# Patient Record
Sex: Female | Born: 1990 | Race: White | Hispanic: No | Marital: Single | Smoking: Former smoker
Health system: Southern US, Community
[De-identification: ages and names within clinical notes are randomized; demographics above are authoritative.]

## PROBLEM LIST (undated history)

## (undated) VITALS — BP 116/78 | HR 102 | Temp 97.8°F | Resp 16 | Ht 63.5 in | Wt 125.0 lb

## (undated) DIAGNOSIS — N390 Urinary tract infection, site not specified: Secondary | ICD-10-CM

## (undated) DIAGNOSIS — F1990 Other psychoactive substance use, unspecified, uncomplicated: Secondary | ICD-10-CM

## (undated) DIAGNOSIS — F32A Depression, unspecified: Secondary | ICD-10-CM

## (undated) DIAGNOSIS — B1920 Unspecified viral hepatitis C without hepatic coma: Secondary | ICD-10-CM

## (undated) DIAGNOSIS — F329 Major depressive disorder, single episode, unspecified: Secondary | ICD-10-CM

## (undated) HISTORY — PX: NO PAST SURGERIES: SHX2092

## (undated) HISTORY — DX: Unspecified viral hepatitis C without hepatic coma: B19.20

## (undated) HISTORY — PX: INDUCED ABORTION: SHX677

---
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
## 2011-05-31 ENCOUNTER — Emergency Department (HOSPITAL_COMMUNITY)
Admission: EM | Admit: 2011-05-31 | Discharge: 2011-05-31 | Disposition: A | Discharge status: Home / Self Care | Payer: Self-pay | Attending: Emergency Medicine | Admitting: Emergency Medicine

## 2011-05-31 DIAGNOSIS — N898 Other specified noninflammatory disorders of vagina: Secondary | ICD-10-CM | POA: Insufficient documentation

## 2011-05-31 DIAGNOSIS — R300 Dysuria: Secondary | ICD-10-CM | POA: Insufficient documentation

## 2011-05-31 DIAGNOSIS — N390 Urinary tract infection, site not specified: Secondary | ICD-10-CM | POA: Insufficient documentation

## 2011-05-31 DIAGNOSIS — R319 Hematuria, unspecified: Secondary | ICD-10-CM | POA: Insufficient documentation

## 2011-05-31 HISTORY — DX: Urinary tract infection, site not specified: N39.0

## 2011-05-31 LAB — WET PREP, GENITAL
Trich, Wet Prep: NONE SEEN
Yeast Wet Prep HPF POC: NONE SEEN

## 2011-05-31 LAB — URINALYSIS, ROUTINE W REFLEX MICROSCOPIC
Bilirubin Urine: NEGATIVE
Ketones, ur: NEGATIVE mg/dL
Nitrite: NEGATIVE
Specific Gravity, Urine: 1.018 (ref 1.005–1.030)
Urobilinogen, UA: 0.2 mg/dL (ref 0.0–1.0)

## 2011-05-31 LAB — POCT PREGNANCY, URINE: Preg Test, Ur: NEGATIVE

## 2011-05-31 LAB — URINE MICROSCOPIC-ADD ON

## 2011-05-31 MED ORDER — NITROFURANTOIN MONOHYD MACRO 100 MG PO CAPS: 100.0000 mg | ORAL_CAPSULE | Freq: Two times a day (BID) | ORAL | Status: AC

## 2011-05-31 NOTE — ED Provider Notes (Signed)
History     CSN: [id number] Arrival date & time: 05/31/2011  5:20 PM   First MD Initiated Contact with Patient 05/31/11 1829      Chief Complaint  Patient presents with  . Urinary Frequency  . Dysuria  . Hematuria    (Consider location/radiation/quality/duration/timing/severity/associated sxs/prior treatment) Patient is a 20 y.o. female presenting with frequency, dysuria, and hematuria. The history is provided by the patient. No language interpreter was used.  Urinary Frequency The current episode started more than 2 days ago. The problem occurs constantly. The problem has not changed since onset.Pertinent negatives include no chest pain, no abdominal pain, no headaches and no shortness of breath. The symptoms are aggravated by nothing. The symptoms are relieved by nothing. She has tried nothing for the symptoms. The treatment provided no relief.  Dysuria  Associated symptoms include frequency and hematuria.  Hematuria Irritative symptoms include frequency. Associated symptoms include dysuria. Pertinent negatives include no abdominal pain or facial swelling.  Alo thinks she may have an STI and wants to be checked.  No flank pain.  Notes some blood only when she wipes.  No f/c/r.    Past Medical History  Diagnosis Date  . UTI (urinary tract infection)     History reviewed. No pertinent past surgical history.  History reviewed. No pertinent family history.  History  Substance Use Topics  . Smoking status: Current Some Day Smoker  . Smokeless tobacco: Not on file  . Alcohol Use: No    OB History    Grav Para Term Preterm Abortions TAB SAB Ect Mult Living                  Review of Systems  Constitutional: Negative for activity change.  HENT: Negative for facial swelling.   Eyes: Negative for discharge.  Respiratory: Negative for shortness of breath.   Cardiovascular: Negative for chest pain.  Gastrointestinal: Negative for abdominal pain and abdominal distention.    Genitourinary: Positive for dysuria, frequency and hematuria.  Musculoskeletal: Negative for arthralgias.  Neurological: Negative for headaches.  Hematological: Negative.   Psychiatric/Behavioral: Negative.     Allergies  Review of patient's allergies indicates no known allergies.  Home Medications  No current outpatient prescriptions on file.  BP 95/61  Pulse 70  Temp(Src) 98.4 F (36.9 C) (Oral)  Resp 16  Ht 5\' 3"  (1.6 m)  Wt 125 lb (56.7 kg)  BMI 22.14 kg/m2  SpO2 100%  Physical Exam  Constitutional: She is oriented to person, place, and time. She appears well-developed and well-nourished.  HENT:  Head: Normocephalic and atraumatic.  Eyes: EOM are normal. Pupils are equal, round, and reactive to light.  Neck: Normal range of motion. Neck supple. No JVD present.  Cardiovascular: Normal rate and regular rhythm.   Pulmonary/Chest: Effort normal and breath sounds normal.  Abdominal: Soft. Bowel sounds are normal.  Genitourinary: Uterus normal. Cervix exhibits discharge. Cervix exhibits no motion tenderness. Right adnexum displays no mass and no tenderness. Left adnexum displays no mass and no tenderness.  Musculoskeletal: Normal range of motion.  Neurological: She is alert and oriented to person, place, and time.  Skin: Skin is warm and dry.  Psychiatric: Thought content normal.    ED Course  Procedures (including critical care time)  Labs Reviewed  URINALYSIS, ROUTINE W REFLEX MICROSCOPIC - Abnormal; Notable for the following:    Appearance CLOUDY (*)    Hgb urine dipstick LARGE (*)    Leukocytes, UA LARGE (*)    All  other components within normal limits  URINE MICROSCOPIC-ADD ON - Abnormal; Notable for the following:    Squamous Epithelial / LPF FEW (*)    Bacteria, UA FEW (*)    All other components within normal limits  POCT PREGNANCY, URINE  POCT PREGNANCY, URINE  GC/CHLAMYDIA PROBE AMP, GENITAL  WET PREP, GENITAL   No results found.   No diagnosis  found.    MDM   Follow up with your doctor for your pap smear.  Return for fevers, chills, worsening pain or flank pain.  Patient verbalizes understanding and agrees to follow up       Joey Hudock K Levoy Geisen-Rasch, MD 05/31/11 2052

## 2011-05-31 NOTE — ED Notes (Signed)
C/o painful urination x 2 days, frequency but only a little comes out.  Today noticed a little blood on tissue.  LMP ended 2 days ago.

## 2011-06-01 LAB — GC/CHLAMYDIA PROBE AMP, GENITAL
Chlamydia, DNA Probe: NEGATIVE
GC Probe Amp, Genital: NEGATIVE

## 2011-06-24 ENCOUNTER — Other Ambulatory Visit (HOSPITAL_COMMUNITY): Payer: Self-pay

## 2011-06-26 ENCOUNTER — Encounter (HOSPITAL_COMMUNITY): Payer: Self-pay | Admitting: Licensed Clinical Social Worker

## 2011-06-26 ENCOUNTER — Ambulatory Visit (HOSPITAL_COMMUNITY)
Admission: RE | Admit: 2011-06-26 | Discharge: 2011-06-26 | Disposition: A | Discharge status: Home / Self Care | Payer: Self-pay | Source: Ambulatory Visit | Attending: Psychiatry | Admitting: Psychiatry

## 2011-06-26 DIAGNOSIS — F1120 Opioid dependence, uncomplicated: Secondary | ICD-10-CM | POA: Insufficient documentation
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
## 2011-06-27 ENCOUNTER — Encounter (HOSPITAL_COMMUNITY): Payer: Self-pay | Admitting: Psychology

## 2011-06-27 ENCOUNTER — Other Ambulatory Visit (HOSPITAL_COMMUNITY): Payer: Self-pay | Admitting: Psychology

## 2011-06-27 DIAGNOSIS — F1120 Opioid dependence, uncomplicated: Secondary | ICD-10-CM | POA: Insufficient documentation

## 2011-06-27 DIAGNOSIS — F1920 Other psychoactive substance dependence, uncomplicated: Secondary | ICD-10-CM | POA: Insufficient documentation

## 2011-06-28 NOTE — Progress Notes (Signed)
    Daily Group Progress Note  Program: CD-IOP   Group Time: 1-2:30 pm  Participation Level: Active  Behavioral Response: Sharing  Type of Therapy: Psycho-education Group  Topic: The Disease Concept of Addiction: first half of group spent presenting the disease concept of addiction. The idea was emphasized that once one has become chemically dependent, it is imperative that they remain free of all mind-altering chemicals. One patient wondered if this meant she couldn't smoke pot any more? I explained what we see happen when one stops using their primary drug of addiction, but continue to use others substances. Typically, they become addicted to the other substance or it brings them back to their primary drug of addiction. Bu the time this session ended, each member understood the importance of total abstinence in recovering from chemical dependency.      Group Time: 2:45-4 pm  Participation Level: Active  Behavioral Response: Appropriate and Sharing  Type of Therapy: Process Group  Topic: Group Process: second half of group was spent in process. Members shared their current feelings and frustrations in early recovery. One member had relapsed over the weekend and admitted she had been questioning whether she was really an alcoholic. There were 2 new group members and they introduced themselves in this half of group. Both had experienced significant losses and attributed their grief and pain to an increase in their use of chemicals. The session went well with good feedback among the group.     Summary: The member was new to the group. She disclosed that she had gone into detox at [facility] a while ago, but had needed more treatment and ended up here. Betrice wondered if she had to stop smoking pot if she was going to stop using opiates? The group made clear the importance of total abstinence. Nirvi reported she had begun using around age 31 and has been snorting opiates for awhile now. She  admitted that her use had increased after her mother died this past 09-17-22. She became tearful after she disclosed this information.  One member disclosed that she had been in detox at [facility] with her. Russell did well and disclosed openly about her life and addiction, especially considering this is her first outpatient group counseling treatment and she is only 20 yo.   Family Program: Family present? No   Name of family member(s):   UDS collected: No Results:   AA/NA attended?: No  Sponsor?: No   Ryane Canavan, LCAS

## 2011-06-28 NOTE — Progress Notes (Signed)
Patient ID: Kara Walker, female   DOB: 03-Jul-1991, 20 y.o.   MRN: [id number] Orientation: The patient is a 20 yo single, caucasian, female seeking treatment to address her opioid dependence. The patient lives in [place] with her father. The patient and her father had sought out this program, but have no insurance. The patient was granted a scholarship and is eligible for services at [facility] for the next 6 months. She reported she completed a detox at [facility] earlier in the month and her sobriety date is December 4th.The patient reported she began using alcohol and drugs around age 54 and took numerous kinds of drugs. A friend offered her an  Oxycontin and her drug use increased. The patient reported her mother ( her parents were divorced) died of a drug overdose in 08-29-22 of this year and the patient's use increased to daily opioid use (she has never used needles and had snorted the pills). She became tearful as she shared this information with me and admitted she just wanted to numb her pain. She is currently working as a Conservation officer, nature at [employer] and displays good motivation for treatment. All documentation was reviewed, signatures obtained, and the orientation successfully completed. The patient will return today at 1 pm to 'officially' enter the program.

## 2011-06-29 ENCOUNTER — Other Ambulatory Visit (HOSPITAL_COMMUNITY): Payer: Self-pay | Admitting: Psychology

## 2011-06-29 DIAGNOSIS — F1920 Other psychoactive substance dependence, uncomplicated: Secondary | ICD-10-CM

## 2011-06-30 NOTE — Progress Notes (Signed)
    Daily Group Progress Note  Program: CD-IOP   Group Time: 1-2:30 pm  Participation Level: Minimal  Behavioral Response: Sharing  Type of Therapy: Psycho-education Group  Topic: Building a Recovery Plan: first half of group included a presentation on basic recovery concepts and how to build a daily recovery plan. Group members participated in the discussion and identified the many different issues one must address in early recovery. There was good disclosure with emphasis on being open and honest and reaching out for help. The importance of meetings and getting to know other people in recovery was noted. Members shared about different meetings they find particularly helpful and they encouraged the newer members, who are still hesitant, to go to meetings.      Group Time: 2:45-4 pm  Participation Level: Active  Behavioral Response: Appropriate and Sharing  Type of Therapy: Process Group  Topic: Group Process: Second half of group was spent in process. Members discussed current issues and struggles they are dealing with. There was good feedback with a number of members encouraging the importance of spirituality in their daily life.    Summary: The patient reported she had attended a meeting yesterday. She admitted having a problem and proceeded to share it with the group. [name] reported she owed her "drug dealer" some money and had been receiving texts and calls about payment. She wondered what she should do? It was ironic that another member had shared this same dilemma last week. He encouraged her to go to the dealer and pay him off, but he insisted she must go with a reliable friend. The patient received good feedback and responded well to this intervention.    Family Program: Family present? No   Name of family member(s):   UDS collected: Yes Results:   AA/NA attended?: [place]  Sponsor?: No   Keionte Swicegood, LCAS

## 2011-07-01 ENCOUNTER — Other Ambulatory Visit (HOSPITAL_COMMUNITY): Payer: Self-pay | Admitting: Psychology

## 2011-07-01 LAB — DRUG SCREEN, URINE
Barbiturate Quant, Ur: NEGATIVE
Creatinine,U: 163.7 mg/dL
Methadone: NEGATIVE
Opiates: NEGATIVE
Propoxyphene: NEGATIVE

## 2011-07-04 ENCOUNTER — Other Ambulatory Visit (HOSPITAL_COMMUNITY): Payer: Self-pay

## 2011-07-06 ENCOUNTER — Other Ambulatory Visit (HOSPITAL_COMMUNITY): Payer: Self-pay | Admitting: Psychology

## 2011-07-06 DIAGNOSIS — F1920 Other psychoactive substance dependence, uncomplicated: Secondary | ICD-10-CM

## 2011-07-06 NOTE — Progress Notes (Signed)
    Daily Group Progress Note  Program: CD-IOP   Group Time: 1-2:30 pm  Participation Level: Minimal  Behavioral Response: Minimizing  Type of Therapy: Psycho-education Group  Topic: Role-Playing Refusal Skills: first part of group spent role-playing. Two group members were teamed up and one tried to persuade the other to join in alcohol or drug use. The member was encouraged to use all sorts of persuasion to coax the other to use. The only thing the other member could do was say "No".  The role-play lasted 3 minutes. Members shared their experiences at the conclusion of the practice. They admitted it was very tempting to join in on the chemical use and frustrating to only be able to say "No". The importance of practicing one's reactions in case they get into difficult awkward situations was emphasized. Group members agreed that practicing various responses based on the specific scenario would be helpful.     Group Time: 2:45- 4 pm  Participation Level: Minimal  Behavioral Response: Appropriate  Type of Therapy: Process Group  Topic: Group Process and Graduation: the second half of group was spent in process. Members shared about their current difficulties and frustrations in early recovery. As the session neared the end, a graduation ceremony was held to honor a group member who was completing the program this afternoon. There were very kind words shared by the group and the graduating member offered hope and encouragement to his fellow members.   Summary: The patient engaged in the role-play, but was hesitant and seemed as if she could be easily swayed into using. She is young and has never had any previous treatment so her coping skills, aside from opiates, are non-existent. The patient had nice words for the graduating member. She continued to inquire about the 12-step meetings and received good feedback from her fellow group members.    Family Program: Family present? No    Name of family member(s):   UDS collected: No Results:   AA/NA attended?: YesThursday  Sponsor?: No   Huie Ghuman, LCAS

## 2011-07-07 ENCOUNTER — Telehealth (HOSPITAL_COMMUNITY): Payer: Self-pay | Admitting: Psychology

## 2011-07-07 NOTE — Progress Notes (Signed)
    Daily Group Progress Note  Program: CD-IOP   Group Time: 1-2:30 pm  Participation Level: Active  Behavioral Response: Appropriate and Sharing  Type of Therapy: Psycho-education Group  Topic: Slips: what to do, what could have been avoided, and how to get back on track. The first part of group was spent discussing slips. Three members shared that they had relapsed over the holiday weekend. The group listened and asked questions as  each member shared, in detail, the events leading up to their drinking or drugging. In each instance, members identified various strategies that could have been employed had they sought to avoid use. The significance of the holiday and in one case, a birthday anniversary, was identified as a potential trigger and each were able to point out some "stinking thinking" that preceded the slips. There was good discussion and disclosure by group members.      Group Time: 2:45- 4 pm  Participation Level: Active  Behavioral Response: Appropriate and Sharing  Type of Therapy: Process Group  Topic: Group Process: the second half of group was spent in process. Each member shared about their current struggles in early recovery. One member shared about his family's intentions to have him enter "[employer]" and the group processed the resistance along with pros and cons of going into this Christian-based, 7 month program. Another member admitted he needed to make plans in order to remain sober for the remainder of today and 2 group members made plans with him once they left this facility. There was open discussion and events and plans were discussed in a critical, but respectful manner.    Summary: the patient admitted that she had used over the weekend. She shared that she had gotten very sad and depressed and decided to get high. She admitted she had gotten some methadone. Andilynn reported that she had not used in almost 4 weeks and her tolerance had gone way down.  She had not thought about this and she reported the methadone was too strong for her and she actually thought she might die. She also explained her boyfriend had slept with someone else and this had added to her hurt. She reminded the group that her mother's birthday was the 21rst and this was the first birthday since her mother's death in 2022/09/21. We discussed how important it is to plan ahead for difficult anniversary's and days and how one must recommit to their recovery and increase their engagement in meetings. During the session, Calista made plans with 2 other group members and they were going to dinner and then to some meetings. The patient is young, with little support and no treatment experience. She was provided good support and validation and she responded well to this intervention.    Family Program: Family present? No   Name of family member(s):   UDS collected: No Results:   AA/NA attended?: No  Sponsor?: No   Keelin Neville, LCAS

## 2011-07-08 ENCOUNTER — Other Ambulatory Visit (HOSPITAL_COMMUNITY): Payer: Self-pay | Attending: Physician Assistant | Admitting: Psychology

## 2011-07-08 DIAGNOSIS — F1920 Other psychoactive substance dependence, uncomplicated: Secondary | ICD-10-CM

## 2011-07-11 ENCOUNTER — Other Ambulatory Visit (HOSPITAL_COMMUNITY): Payer: Self-pay | Admitting: Psychology

## 2011-07-11 DIAGNOSIS — F1920 Other psychoactive substance dependence, uncomplicated: Secondary | ICD-10-CM

## 2011-07-11 NOTE — Progress Notes (Signed)
    Daily Group Progress Note  Program: CD-IOP   Group Time: 1-2:30 pm  Participation Level: Active  Behavioral Response: Sharing  Type of Therapy: Activity Group  Topic:The first half of group consisted of an activity. Members were asked to list 3 things about themselves on a small sheet of paper. Of those 3 things only 1 was true. The slips of paper were placed in a basket and a member drew 1 sheet out and then read it to the fellow group members. The group was challenged to identify who the Thereasa Parkin was and which of the 3 statements was the true one.  The activity proved very challenging and, in most instances, it took numerous times to determine the author. The purpose of the activity was to invite disclosure about one's self, typically about unusual elements or experiences, and promote a greater feeling of closeness among group members. At the conclusion of the exercise, members commented on how much they had enjoyed the activity and getting to know each other better.       Group Time: 2:45-4 pm  Participation Level: Minimal  Behavioral Response: quiet and then patient left early  Type of Therapy: Process Group  Topic: The second half of group was spent in process. Members discussed their current feelings in early recovery. One member shared about her roommate and the roommate's boyfriend, who continue to bring alcohol into the apartment despite the group member's instructions to remain an alcohol and drug-free home. The group offered feedback about how to address this problem and agreed to come to the home later this weekend if she needed help moving the roommate out. There was good support and validation provided. I instructed group members to formulate plans for the coming New Year's holiday and emphasized that they make commitments and become accountable to others in recovery.   Summary: The patient arrived a little late and her face and eyebrows were red and irritated. Someone  commented and she admitted she had just had her eyebrows done. She laughed with the group and admitted they still burned a little. The patient engaged actively in the activity and was repeatedly chosen, incorrectly, by her fellow group members. The patient admitted she had not attended a meeting since the last session, but insisted she planned on going to one tonight. After break, the patient whispered she had started her period and asked to be allowed to go home. She stated she would return quickly. I excused her from group, but she did not return to the session, nor did she phone to explain her failure to return.   Family Program: Family present? No   Name of family member(s):   UDS collected: No Results:   AA/NA attended?: No  Sponsor?: No   Jodee Wagenaar, LCAS

## 2011-07-12 LAB — DRUG SCREEN, URINE
Barbiturate Quant, Ur: NEGATIVE
Creatinine,U: 199.8 mg/dL
Methadone: NEGATIVE

## 2011-07-13 ENCOUNTER — Other Ambulatory Visit (HOSPITAL_COMMUNITY): Payer: Self-pay | Attending: Physician Assistant | Admitting: Psychology

## 2011-07-13 DIAGNOSIS — F1120 Opioid dependence, uncomplicated: Secondary | ICD-10-CM | POA: Insufficient documentation

## 2011-07-13 DIAGNOSIS — F1920 Other psychoactive substance dependence, uncomplicated: Secondary | ICD-10-CM | POA: Insufficient documentation

## 2011-07-13 NOTE — Progress Notes (Signed)
    Daily Group Progress Note  Program: CD-IOP   Group Time: 1-2:30 pm  Participation Level: Minimal  Behavioral Response: Sharing  Type of Therapy: Psycho-education Group  Topic: Technical brewer; first half of session spent discussing the difficulties and frustrations around trust in early recovery. One member's mother was in group today and she talked about the last 5 years and the ongoing problems her daughter has had. She admitted that there are times when she and her husband are confused and don't know what to believe. Other members shared about their own difficulties with family and loved ones and how painful it can be when trust is gone. There was a long debate about one group member and her roommate who had been using alcohol and drugs and what was to be done about it. Most group members emphasized the need to kick the roommate out whereas others felt like she could be given another chance. There was good disclosure among group members.     Group Time: 2:45- 4 pm  Participation Level: Active  Behavioral Response: Sharing  Type of Therapy: Process Group  Topic: Group Process: second half of group spent in process. Members discussed current issues and concerns. one member admitted he had actually used on Wednesday evening after a heated argument with his mother and step-father. He reported that after the argument he had gone out and used morphine. He later realized that he really did need help. After each member shared their plans for the New Year's Evening, the session came to an end.  Summary: The patient reported she intended to go to a meeting tonight and, in fact, she promised the group that she would go to a meeting. She shared little of herself beyond this guarantee. The patient has yet to go to a meeting despite insisting that she would attend one. It remains to be seen whether she is capable of actually getting herself to one. Family Program: Family present? No   Name  of family member(s):   UDS collected: Yes Results: results not available  AA/NA attended?: No  Sponsor?: No   Malissia Rabbani, LCAS

## 2011-07-14 LAB — DRUG SCREEN, URINE
Benzodiazepines.: NEGATIVE
Creatinine,U: 85.8 mg/dL

## 2011-07-14 NOTE — Progress Notes (Signed)
    Daily Group Progress Note  Program: CD-IOP   Group Time: 1-2:30 pm  Participation Level: Active  Behavioral Response: Appropriate  Type of Therapy: Psycho-education Group  Topic: Sources of Support. First half of group spent identifying the various sources of support one has in their/her life. A handout was provided asking that one identify the different sources of support they have. The categories included: family, friends. Job, school, 12-step groups, religion, housing, legal, health care and recreational activities. Members were instructed to list who they have in each category or any other category. Upon completion group members shared their lists on the board. There was a significant discrepancy between members. While some had long lists of people in their lives, others had only 2-3 people. The importance of securing support in different parts of one's life was emphasized and the group discussed, at length, how to secure more support.      Group Time: 2:45-4 pm  Participation Level: Active  Behavioral Response: Appropriate  Type of Therapy: Process Group  Topic: The second half of group was spent in process. Members shared about current issues and concerns in their lives. There was good feedback and discussion among the group.    Summary: The patient reported she had spent a quiet New Year's and had not used. She reported she had attended her first NA meeting and the group applauded this news. The patient reported she had really liked the meeting and planning on going back.  When asked about her connections and sources of support, Jorryn admitted she didn't know anybody that doesn't get high. She asked the group about the different kinds of meetings and received good feedback about the types of 12-step meetings that are offered. The patient left an hour early to attend what she said was a chiropractor appointment.    Family Program: Family present? No   Name of family  member(s):   UDS collected: Yes Results: not available yet  AA/NA attended?: YesTuesday  Sponsor?: No   Lashaun Poch, LCAS

## 2011-07-15 ENCOUNTER — Other Ambulatory Visit (HOSPITAL_COMMUNITY): Payer: Self-pay | Admitting: Psychology

## 2011-07-18 ENCOUNTER — Other Ambulatory Visit (HOSPITAL_COMMUNITY): Payer: Self-pay

## 2011-07-18 NOTE — Progress Notes (Signed)
    Daily Group Progress Note  Program: CD-IOP   Group Time: 1-2:30 pm  Participation Level: Minimal  Behavioral Response: Appropriate  Type of Therapy: Psycho-education Group  Topic: Guest Speaker: the first half of group today was spent with a guest speaker. This woman had graduated from the CD-IOP program here at [facility] approximately 5 months ago and had obtained almost 8 months of sobriety. She shared about herself and struggles in addiction. She described how she has remained alcohol and drug-free and what she does on a daily basis to remain that way.       Group Time: 2:45-4 pm  Participation Level: Active  Behavioral Response: Appropriate and Sharing  Type of Therapy: Process Group  Topic: Group Process; the second half of group was spent in process. Members discussed current struggles and issues in early recovery. One member admitted she has not been doing what she needs to do to remain sober and stated that she wanted to be like the guest speaker and have almost 8 months of sobriety. Another shared some of her concerns in returning to college next week. There was good disclosure and support among group members.     Summary: The patient was quiet, but attentive during the guest speaker. In process, she admitted that she hasn't been doing all she could do to be in recovery. She became tearful and reported she wanted to be, "just like the speaker, with almost 8 months of sobriety". When asked what she could do to commit to recovery, the patient explained she would go to meetings by herself. She explained she had gone with a friend, but the friend wasn't interested in recovery, but only in checking out the cute boys. Odetta stated she would attend meetings this weekend. She displayed a new sense of commitment in trying to make changes in her life. The patient responded well to this intervention.    Family Program: Family present? No   Name of family member(s):   UDS collected:  No Results:   AA/NA attended?: YesThursday  Sponsor?: No   Peace Jost, LCAS

## 2011-07-20 ENCOUNTER — Other Ambulatory Visit (HOSPITAL_COMMUNITY): Payer: Self-pay | Admitting: Psychology

## 2011-07-20 DIAGNOSIS — F1920 Other psychoactive substance dependence, uncomplicated: Secondary | ICD-10-CM

## 2011-07-21 LAB — DRUG SCREEN, URINE
Amphetamine Screen, Ur: NEGATIVE
Barbiturate Quant, Ur: NEGATIVE
Cocaine Metabolites: NEGATIVE
Marijuana Metabolite: NEGATIVE

## 2011-07-21 NOTE — Progress Notes (Signed)
    Daily Group Progress Note  Program: CD-IOP   Group Time: 1-2:30 pm  Participation Level: Active  Behavioral Response: Appropriate  Type of Therapy: Psycho-education Group  Topic: Roles in the Addicted Family System: education was provided on the characteristics of the addictive family system, including the different roles taken on by family members.  The five different roles, including the addicted family member, were identified and discussed at length. Members were asked about their own families of origin and what roles they may have played. At least 3 members were able to identify the roles they played while growing up in the dysfunctional family system. There was good disclosure and discussion about the extraordinary things people will do to maintain equilibrium within the family system.     Group Time: 2:45-4 pm  Participation Level: Active  Behavioral Response: Sharing  Type of Therapy: Process Group  Topic: Group Process and Graduation: the second half of group was spent in process. Members shared some of their current struggles and frustrations. As the session neared the end, a graduation ceremony was held honoring a group member graduating successfully from the program today. There were kind words expressed to this young man who has been a very positive member with excellent insight and feedback for his fellow members.    Summary: The patient reported that she had grown up in an addicted family and she had typically been the scapegoat. She reported that her brother had been the hero. She agreed that she had spent much of her early life walking on egg shells not knowing how her mother was going to act. In process, she reported admitted that her sobriety date was today. She stated that she had done well over the weekend and attended meetings, but while cleaning up her bedroom yesterday she had come across a 40 mg Opana. She described this narcotic pain med as her [facility] and  reported that she had paced around her room trying to decide what she should do. She admitted that she had finally snorted half of the pill and thrown the other away. The group processed with the patient about what other options she might have taken instead of using. A number of group members agreed that it would have been very difficult to not use it while others talked about quickly throwing it in the toilet and other ways of disposing of it. The patient stated she intended to get back on track and attend a meeting tonight. I encouraged her not to be so hard on herself and just focus on today. The patient made some good comments and responded well to this intervention.    Family Program: Family present? No   Name of family member(s):   UDS collected: Yes Results: Positive for Opiates  AA/NA attended?: YesFriday and Saturday  Sponsor?: No   Jakobee Brackins, LCAS

## 2011-07-22 ENCOUNTER — Other Ambulatory Visit (HOSPITAL_COMMUNITY): Payer: Self-pay | Admitting: Psychology

## 2011-07-22 DIAGNOSIS — F329 Major depressive disorder, single episode, unspecified: Secondary | ICD-10-CM

## 2011-07-22 DIAGNOSIS — Z634 Disappearance and death of family member: Secondary | ICD-10-CM

## 2011-07-22 MED ORDER — SERTRALINE HCL 50 MG PO TABS: 50.0000 mg | ORAL_TABLET | Freq: Every day | ORAL | Status: DC

## 2011-07-25 ENCOUNTER — Other Ambulatory Visit (HOSPITAL_COMMUNITY): Payer: Self-pay | Admitting: Psychology

## 2011-07-25 NOTE — Progress Notes (Signed)
    Daily Group Progress Note  Program: CD-IOP   Group Time: 1-2:30 pm  Participation Level: Active  Behavioral Response: Appropriate and Sharing  Type of Therapy: Psycho-education Group  Topic:Family Sculpture: First half of group was spent sculpting families. The session included group members 'sculpting' their families at some point in their childhood. The sculpting member chose other group members to stand in for family members and positioned them according to the emotional "issues" that were present at that time in their life. Group members were asked how it felt to be positioned as they had been and the sculpting member was provided feedback and shared about their experience of the session.       Group Time: 2:45-4 pm  Participation Level: Active  Behavioral Response: Appropriate and Sharing  Type of Therapy: Process Group  Topic: Group Process and Graduation: the second half of group was spent in process. Members shared their feelings about current events and issues in their lives. There was good disclosure among members and the intensity of the previous exercise seemed to bring the group "deeper" and closer together. At the conclusion of the session, a graduation ceremony was conducted to honor a successfully graduating member.    Summary: The patient offered to present her family sculpture. She arranged her fellow group members to represent her 2 siblings, her mother and herself. The mother was "passed out" and the patient explained how she took care of her younger sister most of the time. She admitted that when she was riding the school bus home she was always anxious and uncertain about what condition she would find her mother in upon arrival at home. This was when she was around 21 years old. Her fellow group members were stunned by the sadness and uncertainty of her sculpture and more than one member applauded her for having "survived" to age 74. The patient admitted it  was sad and painful to see her family displayed like this. In group process, the patient shared about her tattoo and explained that it was her mother's name, "Lawanna Kobus", surrounded by a heart, which was the way her mother had always signed her name on the letters she wrote her daughter. The patient's mother overdosed and died in 09-27-10 and the patient is still reeling from her death. Her sobriety date is Jan 28, 2024since she relapsed earlier this week. The patient responded very well to this intervention.   Family Program: Family present? No   Name of family member(s):   UDS collected: No Results:   AA/NA attended?: YesTuesday, Wednesday and Thursday  Sponsor?: No   Julietta Batterman, LCAS

## 2011-07-26 NOTE — Progress Notes (Signed)
    Daily Group Progress Note  Program: CD-IOP   Group Time: 1-2:30 PM  Participation Level: Minimal  Behavioral Response: Appropriate  Type of Therapy: Psycho-education Group  Topic: Chaplain: the first half of group was spent with the monthly visit from a chaplain in the [facility] system. Today Cherlynn June joined the group to talk about spirituality. He described his own spiritual beliefs, but emphasized the importance of developing one's own spiritual values and noted that those would likely vary immensely among group members. He invited comments and a good discussion ensued with members sharing about their religious training, if any, early in life and how they have handled those beliefs into adulthood. While at least a few admitted anger over organized religion and current feelings of agnosticism, others embraced their [place] faith. The session went well and members later expressed surprise that the chaplain was so open to other beliefs, including quoting a wonderful Buddhist story.      Group Time: 2:45-4 PM  Participation Level: Active  Behavioral Response: Sharing  Type of Therapy: Process Group  Topic: Process and New Group Member Introduction; second half of group was spent in process with a number of members expressing appreciation and surprise over the chaplain's presentation. Later in process, the newest member of the group was invited by her fellow group members to share a little bit about herself and what had brought her to this program. There was good disclosure and feedback and she appeared very comfortable in telling her story.    Summary: The patient was quiet, but attentive in group today. i asked about her feelings and beliefs about spirituality, especially in light of her mother's overdose death just 13 months ago? She admitted she was "angry with God for doing this to me". The chaplain agreed that she would be mad and he expressed his own frustration over  such pain and loss. Although she didn't say it, the patient has had little experience with organized religion and this would be an unfamiliar area for her. In process, the patient expressed surprise that the chaplain was so open to other ways of belief and faith. She had thought he would be adamant and rigid about faith. She reported she had remained abstinent and had attended meetings over the weekend. She admitted she had left early during 1 NA meeting because it was so boring. Another group member had also been in the meeting and noted that after the readings, were are somewhat laborious, the session was very good with a lot of deep heartfelt disclosures. The patient's sobriety date remains January 9th.    Family Program: Family present? No   Name of family member(s):   UDS collected: No Results:   AA/NA attended?: YesThursday and Saturday  Sponsor?: No   Avraham Benish, LCAS

## 2011-07-27 ENCOUNTER — Other Ambulatory Visit (HOSPITAL_COMMUNITY): Payer: Self-pay | Admitting: *Deleted

## 2011-07-28 NOTE — Progress Notes (Signed)
    Daily Group Progress Note  Program: CD-IOP  Group Time: 1:00- 2:00 pm  Participation Level: Minimal  Behavioral Response: Appropriate, Sharing and Motivated  Type of Therapy:  Education and Training Group  Summary of Progress: Group time was spent discussing reasons that the members stay abstinent from drugs and alcohol and exploring the though process of their relapses when they occur. Members completed a Decisional Balance sheet handout which listed immediate and future consequences for both staying off substances and using. They also completed a sheet about their own personal reasons to stay abstinent that they are to keep nearby to help in times of urges and craving to "think through the drink" or "play the tape to the end". Kara Walker was attentive and participated minimally. She does not seem to have any denial that she can use in moderation. She completed the group task and shared with the members.     Group Time: 2:00- 3:00 pm  Participation Level:  Active  Behavioral Response: Sharing, Motivated and Assertive  Type of Therapy: Process Group  Summary of Progress: Group time was spent in group process discussing the first part of group and then in guided meditation and planning for the next 48 hours of sobriety. The pt reported that she really enjoyed the guided meditation and said she had a "body buzz" afterward. She reported feeling very relaxed and content. She shared about her frustration with her best friend who went to an NA meeting with the pt on Tuesday night and was high. Pt reported feeling very hurt and angry. Discussed how she plans to address or not address the issue. Pt plans to continue attending NA daily.   Aris Lot, COUNS

## 2011-07-29 ENCOUNTER — Other Ambulatory Visit (HOSPITAL_COMMUNITY): Payer: Self-pay

## 2011-07-31 ENCOUNTER — Encounter (HOSPITAL_COMMUNITY): Payer: Self-pay | Admitting: Emergency Medicine

## 2011-07-31 ENCOUNTER — Emergency Department (HOSPITAL_COMMUNITY)
Admission: EM | Admit: 2011-07-31 | Discharge: 2011-07-31 | Disposition: A | Discharge status: Home / Self Care | Payer: Self-pay | Attending: Emergency Medicine | Admitting: Emergency Medicine

## 2011-07-31 DIAGNOSIS — R300 Dysuria: Secondary | ICD-10-CM | POA: Insufficient documentation

## 2011-07-31 DIAGNOSIS — F17200 Nicotine dependence, unspecified, uncomplicated: Secondary | ICD-10-CM | POA: Insufficient documentation

## 2011-07-31 DIAGNOSIS — N390 Urinary tract infection, site not specified: Secondary | ICD-10-CM | POA: Insufficient documentation

## 2011-07-31 DIAGNOSIS — R109 Unspecified abdominal pain: Secondary | ICD-10-CM | POA: Insufficient documentation

## 2011-07-31 LAB — URINALYSIS, ROUTINE W REFLEX MICROSCOPIC
Glucose, UA: NEGATIVE mg/dL
Ketones, ur: NEGATIVE mg/dL
pH: 8 (ref 5.0–8.0)

## 2011-07-31 LAB — URINE MICROSCOPIC-ADD ON

## 2011-07-31 LAB — URINE CULTURE

## 2011-07-31 LAB — POCT PREGNANCY, URINE: Preg Test, Ur: NEGATIVE

## 2011-07-31 MED ORDER — SULFAMETHOXAZOLE-TMP DS 800-160 MG PO TABS
1.0000 | ORAL_TABLET | Freq: Once | ORAL | Status: AC
  Administered 2011-07-31: 1 via ORAL
  Filled 2011-07-31: qty 1

## 2011-07-31 MED ORDER — PHENAZOPYRIDINE HCL 200 MG PO TABS: 200.0000 mg | ORAL_TABLET | Freq: Three times a day (TID) | ORAL | Status: AC

## 2011-07-31 MED ORDER — SULFAMETHOXAZOLE-TRIMETHOPRIM 800-160 MG PO TABS: 1.0000 | ORAL_TABLET | Freq: Two times a day (BID) | ORAL | Status: AC

## 2011-07-31 NOTE — ED Provider Notes (Signed)
History     CSN: [id number]  Arrival date & time 07/31/11  1440   First MD Initiated Contact with Patient 07/31/11 1625      Chief Complaint  Patient presents with  . Abdominal Pain    (Consider location/radiation/quality/duration/timing/severity/associated sxs/prior treatment) Patient is a 21 y.o. female presenting with dysuria. The history is provided by the patient.  Dysuria  This is a new problem. The current episode started yesterday. The problem occurs every urination. The problem has been gradually worsening. The pain is at a severity of 4/10. The pain is mild. There has been no fever. She is not sexually active. Associated symptoms include frequency and urgency. Pertinent negatives include no chills, no nausea, no vomiting, no discharge, no hematuria, no possible pregnancy and no flank pain. She has tried nothing for the symptoms.  Pt stats she has history of UTIs and this feels like one. Denies abdominal pain, vaginal bleeding, vaginal discharge. States no way she can be pregnant.   Past Medical History  Diagnosis Date  . UTI (urinary tract infection)     Past Surgical History  Procedure Date  . No past surgeries     No family history on file.  History  Substance Use Topics  . Smoking status: Current Some Day Smoker  . Smokeless tobacco: Not on file  . Alcohol Use: No    OB History    Grav Para Term Preterm Abortions TAB SAB Ect Mult Living                  Review of Systems  Constitutional: Negative for fever and chills.  HENT: Negative.   Eyes: Negative.   Respiratory: Negative.   Cardiovascular: Negative.   Gastrointestinal: Negative for nausea, vomiting, abdominal pain and diarrhea.  Genitourinary: Positive for dysuria, urgency and frequency. Negative for hematuria, flank pain, vaginal bleeding and vaginal discharge.  Musculoskeletal: Negative.   Skin: Negative.   Neurological: Negative.   Psychiatric/Behavioral: Negative.     Allergies  Review  of patient's allergies indicates no known allergies.  Home Medications   Current Outpatient Rx  Name Route Sig Dispense Refill  . SERTRALINE HCL 50 MG PO TABS Oral Take 1 tablet (50 mg total) by mouth daily. 30 tablet 0    BP 112/65  Pulse 75  Temp(Src) 98.5 F (36.9 C) (Oral)  Resp 20  SpO2 98%  Physical Exam  Nursing note and vitals reviewed. Constitutional: She is oriented to person, place, and time. She appears well-developed and well-nourished. No distress.  HENT:  Head: Normocephalic and atraumatic.  Eyes: Conjunctivae are normal.  Neck: Neck supple.  Pulmonary/Chest: Effort normal and breath sounds normal.  Abdominal: Soft. Bowel sounds are normal. She exhibits no distension. There is no tenderness.  Genitourinary:       No CVA tenderness  Neurological: She is alert and oriented to person, place, and time.  Skin: Skin is warm and dry.  Psychiatric: She has a normal mood and affect.    ED Course  Procedures (including critical care time) 4:46 PM Pt seen and examined. On exam no abdominal tenderness, no CVA tenderness. Pt afebrile. Denies vaginal discharge or bleeding. Will check UA.     Labs Reviewed  POCT PREGNANCY, URINE  POCT PREGNANCY, URINE  URINALYSIS, ROUTINE W REFLEX MICROSCOPIC   Results for orders placed during the hospital encounter of 07/31/11  URINALYSIS, ROUTINE W REFLEX MICROSCOPIC      Component Value Range   Color, Urine YELLOW  YELLOW  APPearance TURBID (*) CLEAR    Specific Gravity, Urine 1.024  1.005 - 1.030    pH 8.0  5.0 - 8.0    Glucose, UA NEGATIVE  NEGATIVE (mg/dL)   Hgb urine dipstick SMALL (*) NEGATIVE    Bilirubin Urine NEGATIVE  NEGATIVE    Ketones, ur NEGATIVE  NEGATIVE (mg/dL)   Protein, ur 30 (*) NEGATIVE (mg/dL)   Urobilinogen, UA 1.0  0.0 - 1.0 (mg/dL)   Nitrite NEGATIVE  NEGATIVE    Leukocytes, UA LARGE (*) NEGATIVE   POCT PREGNANCY, URINE      Component Value Range   Preg Test, Ur NEGATIVE    URINE  MICROSCOPIC-ADD ON      Component Value Range   Squamous Epithelial / LPF FEW (*) RARE    WBC, UA TOO NUMEROUS TO COUNT  <3 (WBC/hpf)   RBC / HPF 0-2  <3 (RBC/hpf)   Bacteria, UA MANY (*) RARE    Urine-Other AMORPHOUS URATES/PHOSPHATES     No results found.  UA infected. Will treat. First dose of antibiotics given in ED. Pt has no signs of Pyelonephritis. Refusing pelvic exam, states no vaginal symptoms not sexually active. WIll d/c home with treatment. Instructed to follow up if not improving and in one week for recheck of ua.  No diagnosis found.    MDM         Lottie Mussel, PA 07/31/11 1946

## 2011-07-31 NOTE — ED Notes (Signed)
Called Lab to follow up on lack of UA results visible in EPIC.  Urine was received by lab, and urine is still pending, Lab will call me back with results

## 2011-07-31 NOTE — ED Notes (Signed)
Frequency, burning on urination, foul odor, was treated for uti 2 months ago

## 2011-08-01 ENCOUNTER — Other Ambulatory Visit (HOSPITAL_COMMUNITY): Payer: Self-pay | Admitting: Psychology

## 2011-08-01 DIAGNOSIS — F1920 Other psychoactive substance dependence, uncomplicated: Secondary | ICD-10-CM

## 2011-08-02 LAB — PRESCRIPTION ABUSE MONITORING 17P, URINE
Amphetamine/Meth: NEGATIVE ng/mL
Barbiturate Screen, Urine: NEGATIVE ng/mL
Buprenorphine, Urine: NEGATIVE ng/mL
Carisoprodol, Urine: POSITIVE ng/mL — ABNORMAL HIGH
Fentanyl, Ur: NEGATIVE ng/mL
Oxycodone Screen, Ur: NEGATIVE ng/mL
Propoxyphene: NEGATIVE ng/mL
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
## 2011-08-03 ENCOUNTER — Other Ambulatory Visit (HOSPITAL_COMMUNITY): Payer: Self-pay | Admitting: Psychology

## 2011-08-03 LAB — ALCOHOL METABOLITE (ETG), URINE: Ethyl Glucuronide (EtG): POSITIVE ng/mL — ABNORMAL HIGH

## 2011-08-03 LAB — METHADONE (GC/LC/MS), URINE
EDDP (GC/LC/MS), ur confirm: 2080 NG/ML — ABNORMAL HIGH
Methadone (GC/LC/MS), ur confirm: 2280 NG/ML — ABNORMAL HIGH

## 2011-08-03 NOTE — ED Provider Notes (Signed)
Medical screening examination/treatment/procedure(s) were performed by non-physician practitioner and as supervising physician I was immediately available for consultation/collaboration.  Shelda Jakes, MD 08/03/11 [phone]

## 2011-08-03 NOTE — ED Notes (Signed)
+   Urine. Patient treated with Septra. Sensitive to same. Chart appended per protocol MD.

## 2011-08-04 NOTE — Progress Notes (Signed)
    Daily Group Progress Note  Program: CD-IOP   Group Time: 1-2:30 pm  Participation Level: Minimal  Behavioral Response: Appropriate  Type of Therapy: Psycho-education Group  Topic: Visit with the Pharmacist: first half of group was spent with the [facility] pharmacist, Peggye Fothergill. She discussed the effects of alcohol and drugs on the brain and body. Included in the discussion was the symptoms of impairment and withdrawal. There was a detailed explanation on the potential dangers that hallucinogens represent and the types of psychosis that are frequently displayed. The group was very attentive and asked questions about their medications and some of the mental health issues, particularly anxiety, that they struggle with. There was excellent feedback and disclosure among the group.     Group Time: 2:45-4 pm  Participation Level: Minimal  Behavioral Response: Sharing  Type of Therapy: Process Group  Topic: Group Process; Second half of group spent in process. A member had returned after an absence of 4 sessions and he disclosed the harrowing trip he had taken with a drug dealer down to [place]. Another member had returned after a period of time at [facility]. She discussed what will be different this time and the requirements of living in a halfway house. There was good discussion and sharing about current issues and concerns.   Summary: The patient was attentive to the presentation by the pharmacist and asked questions about opiates. She reported during check-in that she had not been honest and had a new sobriety date. She admitted that she had felt there was a big difference between not doing any opiates, but having a beer at a party with friends. She agreed to change her sobriety date to reflect her alcohol use. She made some good comments.    Family Program: Family present? No   Name of family member(s):   UDS collected: No Results:   AA/NA attended?: YesMonday, Tuesday and Friday  Sponsor?: No   Savaya Hakes, LCAS

## 2011-08-05 ENCOUNTER — Other Ambulatory Visit (HOSPITAL_COMMUNITY): Payer: Self-pay | Admitting: Psychology

## 2011-08-05 LAB — OTHER SOLSTAS TEST: Ethyl Glucuronide (EtG): 15077 NG/ML

## 2011-08-08 ENCOUNTER — Other Ambulatory Visit (HOSPITAL_COMMUNITY): Payer: Self-pay

## 2011-08-08 NOTE — Progress Notes (Signed)
    Daily Group Progress Note  Program: CD-IOP   Group Time: 1-2:30 pm  Participation Level: Minimal  Behavioral Response: Appropriate  Type of Therapy: Psycho-education Group  Topic: Stress and Wellness; what do you do to take care of yourself. First half of group spent in presentation on Stress. The physiological aspects of stress were identified and the damage that can result from 'chronic' stress explained. Group members were provided a handout instructing them to identify the things they do on a daily basis to take care of themselves. Members shared their answers in the ensuing discussion. There were significant discrepancies between group members and their self-care.      Group Time: 2:45-3 pm  Participation Level: Minimal  Behavioral Response: Sharing  Type of Therapy: Process Group  Topic:Group Process; second half of group was spent in process. Members discussed current issues and concerns in early recovery. The group was dismissed early because weather conditions were deteriorating with sleet and snow appearing heavy and roads reportedly becoming more difficult to traverse.     Summary:The patient reported she is feeling better about herself. She reported she has barely used anything in the past month and doesn't think about getting high or getting drugs all the time like she has in the past. She described herself as a 'binger' and she will be fine for days at a time, but then suddenly desire to get high and go do it. She informed the group that she would be going back to school and is excited about graduating in June. She responded well to this intervention.     Family Program: Family present? No   Name of family member(s):   UDS collected: Yes Results: positive for narcotics  AA/NA attended?: YesTuesday, Wednesday and Thursday  Sponsor?: No   Mallory Schaad, LCAS

## 2011-08-09 ENCOUNTER — Telehealth (HOSPITAL_COMMUNITY): Payer: Self-pay | Admitting: Psychology

## 2011-08-10 ENCOUNTER — Other Ambulatory Visit (HOSPITAL_COMMUNITY): Payer: Self-pay | Admitting: Psychology

## 2011-08-10 DIAGNOSIS — F1920 Other psychoactive substance dependence, uncomplicated: Secondary | ICD-10-CM

## 2011-08-11 LAB — PRESCRIPTION ABUSE MONITORING 17P, URINE
Amphetamine/Meth: NEGATIVE ng/mL
Barbiturate Screen, Urine: NEGATIVE ng/mL
Benzodiazepine Screen, Urine: NEGATIVE ng/mL
Buprenorphine, Urine: NEGATIVE ng/mL
Cannabinoid Scrn, Ur: NEGATIVE ng/mL
Carisoprodol, Urine: POSITIVE ng/mL — ABNORMAL HIGH
Creatinine, Urine: 268.77 mg/dL
Fentanyl, Ur: POSITIVE ng/mL — ABNORMAL HIGH
Meperidine, Ur: POSITIVE ng/mL — ABNORMAL HIGH
Propoxyphene: NEGATIVE ng/mL

## 2011-08-11 NOTE — Progress Notes (Signed)
    Daily Group Progress Note  Program: CD-IOP   Group Time: 1-2:30 pm  Participation Level: Minimal  Behavioral Response: Sharing  Type of Therapy: Psycho-education Group  Topic: Fighting the Desire to Use; how does one do that? The first half of group was spent talking about early recovery and the struggle to remain abstinent. One member disclosed receiving a text from a friend inviting her to join him in using Ecstasy and MDMA. She cried as she shared about her loneliness in early recovery and how much she missed her friends. She went on to romanticize her experiences using hallucinogens. The group talked about their own cravings and what they have done in the past to refrain from relapsing. There was good disclosure and sharing among group members. Ultimately, the group agreed that one has to decide whether sobriety is important enough to fight for.      Group Time: 2:45-4 pm  Participation Level: Active  Behavioral Response: Appropriate and Sharing  Type of Therapy: Process Group  Topic: Group Process and Graduation; second half of group spent in process. Members shared about current struggles in early recovery. As the session neared the end, a ceremony was held for a departing group member. Kind words of support and encouragement were offered and the graduating member appeared genuinely touched by her fellow group members' affirmations.    Summary: The patient admitted during check-in, that she had used yesterday and her sobriety date was today. 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 She reported she had never succumbed to 'peer pressure' before and had ultimately chosen to use after her friend kept pressuring her to join in the drug use. The group assisted in addressing alternative actions she might have taken, including leaving the friend's home when she realized that she was high. The patient admitted she had not been to a  12-step meeting in a while. She had kind words for the graduating member and shared that today she would be starting back in a program to complete her hs degree. The patient was open about her relapse, but lacks the assertive strong commitment to protect her sobriety regardless of the circumstances.    Family Program: Family present? No   Name of family member(s):   UDS collected: Yes Results: results not back  AA/NA attended?: No  Sponsor?: No   Lam Mccubbins, LCAS

## 2011-08-12 ENCOUNTER — Other Ambulatory Visit (HOSPITAL_COMMUNITY): Payer: Self-pay | Attending: Physician Assistant

## 2011-08-12 DIAGNOSIS — F329 Major depressive disorder, single episode, unspecified: Secondary | ICD-10-CM | POA: Insufficient documentation

## 2011-08-12 DIAGNOSIS — F1920 Other psychoactive substance dependence, uncomplicated: Secondary | ICD-10-CM | POA: Insufficient documentation

## 2011-08-12 DIAGNOSIS — F3289 Other specified depressive episodes: Secondary | ICD-10-CM | POA: Insufficient documentation

## 2011-08-12 DIAGNOSIS — Z634 Disappearance and death of family member: Secondary | ICD-10-CM | POA: Insufficient documentation

## 2011-08-12 DIAGNOSIS — F1120 Opioid dependence, uncomplicated: Secondary | ICD-10-CM | POA: Insufficient documentation

## 2011-08-12 LAB — METHADONE (GC/LC/MS), URINE: Methadone (GC/LC/MS), ur confirm: 4950 NG/ML — ABNORMAL HIGH

## 2011-08-12 LAB — CARISOPRODOL (GC/LC/MS), URINE
CARISOPRODOL (GC/LC/MS), ur confirm: NEGATIVE NG/ML
Meprobamate (GC/LC/MS), ur confirm: NEGATIVE NG/ML

## 2011-08-12 LAB — FENTANYL (GC/LC/MS), URINE
Fentanyl, confirm: NEGATIVE NG/ML
Norfentanyl, confirm: NEGATIVE NG/ML

## 2011-08-15 ENCOUNTER — Other Ambulatory Visit (HOSPITAL_COMMUNITY): Payer: Self-pay | Admitting: Psychology

## 2011-08-16 LAB — PRESCRIPTION ABUSE MONITORING 17P, URINE
Barbiturate Screen, Urine: NEGATIVE ng/mL
Cocaine Metabolites: NEGATIVE ng/mL
Fentanyl, Ur: NEGATIVE ng/mL
MDMA URINE: NEGATIVE ng/mL
Methadone Screen, Urine: NEGATIVE ng/mL
Oxycodone Screen, Ur: POSITIVE ng/mL — ABNORMAL HIGH
Tramadol Scrn, Ur: NEGATIVE ng/mL
Zolpidem, Urine: NEGATIVE ng/mL

## 2011-08-16 NOTE — Progress Notes (Signed)
    Daily Group Progress Note  Program: CD-IOP   Group Time: 1-2:30 PM  Participation Level: None  Behavioral Response: ATTENTIVE, BUT NOT SHARING  Type of Therapy: Process Group  Topic: Group Process; first half of group was spent in process. Members shared issues and concerns that were currently troubling them. There was good feedback and disclosure among the members.      Group Time: 2:45-4 PM  Participation Level: None  Behavioral Response: NOT SHARING  Type of Therapy: Psycho-education Group  Topic: The Progressive Disease of Addiction:  a presentation was provided identifying the progressive nature of addiction. A handout accompanied the presentation and group members participated in identifying their own experiences with the symptoms listed on the handout. All eight members were able to concur with the symptoms almost down to the bottom of the progression, which included "complete abandonment".  The insanity of continued efforts to hide use despite the obvious changes in personhood was acknowledged by almost everyone.    Summary: The patient was attentive, but did not share about her life and status in recovery. She reported that her sobriety date was the same and she had not used anything over the weekend.    Family Program: Family present? No   Name of family member(s):   UDS collected: Yes Results: not available yet  AA/NA attended?: No  Sponsor?: No   Ileigh Mettler, LCAS

## 2011-08-17 ENCOUNTER — Other Ambulatory Visit (HOSPITAL_COMMUNITY): Payer: Self-pay | Admitting: Psychology

## 2011-08-17 LAB — OPIATES/OPIOIDS (LC/MS-MS)
Hydrocodone: NEGATIVE NG/ML
Hydromorphone: NEGATIVE NG/ML
Morphine Urine: NEGATIVE NG/ML
Oxycodone, ur: NEGATIVE NG/ML
Oxymorphone: 61 NG/ML
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
## 2011-08-19 ENCOUNTER — Other Ambulatory Visit (HOSPITAL_COMMUNITY): Payer: Self-pay | Admitting: Psychology

## 2011-08-22 ENCOUNTER — Other Ambulatory Visit (HOSPITAL_COMMUNITY): Payer: Self-pay | Admitting: Psychology
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
## 2011-08-23 ENCOUNTER — Telehealth (HOSPITAL_COMMUNITY): Payer: Self-pay | Admitting: Psychology

## 2011-08-24 ENCOUNTER — Other Ambulatory Visit (HOSPITAL_COMMUNITY): Payer: Self-pay | Admitting: Psychology

## 2011-08-24 DIAGNOSIS — F1920 Other psychoactive substance dependence, uncomplicated: Secondary | ICD-10-CM

## 2011-08-25 ENCOUNTER — Encounter (HOSPITAL_COMMUNITY): Payer: Self-pay | Admitting: Psychology
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
## 2011-08-26 ENCOUNTER — Other Ambulatory Visit (HOSPITAL_COMMUNITY): Payer: Self-pay | Admitting: Psychology

## 2011-08-26 NOTE — Progress Notes (Signed)
Patient ID: Kara Walker, female   DOB: 04-12-1991, 21 y.o.   MRN: [id number] I met with [name], the patient's father, this afternoon. He had phoned earlier in the day requesting a time to meet with me. [name] explained that his daughter had told him she had been using for the past few days and he felt very angry and frustrated with her. He wanted to know what he should do? I reviewed the nature of this chronic illness, which typically includes relapse. We discussed how he might approach his daughter should she share any future relapses and I emphasized that he try to 'hear her' and support her in the efforts to remain drug-free. I also encouraged him to consider attending [facility] and explained how this organization could benefit him. I also asked about the current responsibilities that his daughter is expected to fulfill around the house? [name] and his daughter are the only 2 people in his home. He admitted that there is very little that she is asked to do, but that she does do her own laundry. He admitted she doesn't even assist in cleaning up the kitchen and he ends up washing the dishes. I encouraged him to sit down with [name] and talk about what should be expected from her while she is living with him. Certainly a 21 yo in college or living in an apartment would be held to some degree of accountability. The session went well and [name] was very appreciative of the time spent and recommendations provided. I assured him that he could contact me at any time with any further questions or concerns and/or we could meet with his daughter for another family session.

## 2011-08-29 ENCOUNTER — Other Ambulatory Visit (HOSPITAL_COMMUNITY): Payer: Self-pay | Admitting: Psychology

## 2011-08-29 NOTE — Progress Notes (Signed)
    Daily Group Progress Note  Program: CD-IOP   Group Time: 1-2:30 pm  Participation Level: Active  Behavioral Response: Appropriate  Type of Therapy: Activity Group  Topic:Introductory Yoga Class: first half of group spent down in the gymnasium with a Air traffic controller. Group members sat on yoga mats and followed the yoga instructor in an introductory yoga class. There were 8 positions practiced. The group seemed very pleased and energized by this activity. Upon completion the group returned to the group room where the instructor shared about her own life in recovery and how she had begun practicing yoga after entering treatment for her alcohol dependence. There was good discussion and questions from group members. Handouts were provided and members encouraged to practice these poses, or asanas, between now and when she returns for another yoga class.       Group Time: 2:45-4 pm  Participation Level: Active  Behavioral Response: Appropriate and Sharing  Type of Therapy: Process Group  Topic: Second half of group was spent in group process. Members discussed current issues and concerns in early recovery. Some noted progress they have made while others shared disappointments with early recovery or failure to maintain sobriety. One member shared her frustration and hurt by a young man she had been seeing and the bigger issue, of codependence, was discussed. Many of the group members admitted they were very codependent and had problems leaving relationships despite their dysfunctional nature. As the session neared completion, members discussed their plans for the upcoming weekend.   Summary: The patient reported she had never done yoga, but enjoyed the session and seem somewhat energized by the asanas. In process, the group noted how much better she looked and the patient admitted she felt much better than on Wednesday when she was in opiate withdrawal. The patient reported she feels  embarrassed because she has continued to struggle with sobriety and relapsed on a number of occasions. She has not done what we have asked of her and shouldn't be surprised that very little has changed in her life. The patient reported she will attend a meeting with another group member tonight.     Family Program: Family present? No   Name of family member(s):   UDS collected: No Results:   AA/NA attended?: No  Sponsor?: No   Korina Tretter, LCAS

## 2011-08-30 ENCOUNTER — Other Ambulatory Visit (HOSPITAL_COMMUNITY): Payer: Self-pay | Admitting: Physician Assistant

## 2011-08-30 DIAGNOSIS — Z634 Disappearance and death of family member: Secondary | ICD-10-CM

## 2011-08-30 DIAGNOSIS — F3289 Other specified depressive episodes: Secondary | ICD-10-CM

## 2011-08-30 DIAGNOSIS — F329 Major depressive disorder, single episode, unspecified: Secondary | ICD-10-CM

## 2011-08-30 MED ORDER — SERTRALINE HCL 50 MG PO TABS: 50.0000 mg | ORAL_TABLET | Freq: Every day | ORAL | Status: DC

## 2011-08-30 NOTE — Progress Notes (Signed)
    Daily Group Progress Note  Program: CD-IOP   Group Time: 1-2:30 pm  Participation Level: Minimal  Behavioral Response: Appropriate  Type of Therapy: Psycho-education Group  Topic:Recovery 101: first part of group included a review of basic recovery concepts. The group had 3 new group members today and the session was, in part, a primer for recovery and also a review for current group members. There was good discussion among the members who recounted specific events or incidents where they struggled, were successful in fighting off cravings or, in some instances, succumbed to their addiction. The new group members engaged in the discussion and each one shared significantly about their own struggles.      Group Time: 2:45-4 pm  Participation Level: Active  Behavioral Response: Sharing  Type of Therapy: Process Group  Topic: Group process; second half of group was spent in process. Members discussed current issues and concerns in early recovery. One member noted she had picked up a 30 day chip yesterday and the group applauded this news. There was good disclosure and feedback among the group.    Summary: The patient was attentive, but quiet in group today. During process, she shared that she was very confused and worried about her brother. The patient reported her brother had been opiate-free for almost a year, but had continued smoking pot. Recently, she has observed him drinking a lot more and she is very concerned about his lack of motivation. She asked the group what she could do? Other members reminded her that she could only address her own addiction, but that she could serve as an example of what sobriety can be for him. She admitted she is afraid for him and another member encouraged her to tell him that she loves him and will be there for him, but there is nothing she can do while he continues to use. The patient received good feedback from the group and was encouraged to  focus on her recovery because there was nothing she could do to stop him from using if he wasn't interested. She seemed dissatisfied with these recommendations, but acknowledged he doesn't seem interested in sobriety.    Family Program: Family present? No   Name of family member(s):   UDS collected: No Results:   AA/NA attended?: No  Sponsor?: No   Linden Tagliaferro, LCAS

## 2011-08-31 ENCOUNTER — Encounter (HOSPITAL_COMMUNITY): Payer: Self-pay | Admitting: Psychology

## 2011-08-31 ENCOUNTER — Other Ambulatory Visit (HOSPITAL_COMMUNITY): Payer: Self-pay

## 2011-09-01 ENCOUNTER — Encounter (HOSPITAL_COMMUNITY): Payer: Self-pay | Admitting: Psychology

## 2011-09-01 LAB — PRESCRIPTION ABUSE MONITORING 17P, URINE
Cannabinoid Scrn, Ur: POSITIVE ng/mL — ABNORMAL HIGH
Cocaine Metabolites: NEGATIVE ng/mL
Creatinine, Urine: 146.98 mg/dL
MDMA URINE: NEGATIVE ng/mL
Meperidine, Ur: NEGATIVE ng/mL
Methadone Screen, Urine: NEGATIVE ng/mL
Opiate Screen, Urine: NEGATIVE ng/mL
Tapentadol, urine: NEGATIVE ng/mL
Tramadol Scrn, Ur: NEGATIVE ng/mL
Zolpidem, Urine: NEGATIVE ng/mL

## 2011-09-01 LAB — ALCOHOL METABOLITE (ETG), URINE: Ethyl Glucuronide (EtG): NEGATIVE ng/mL

## 2011-09-02 ENCOUNTER — Other Ambulatory Visit (HOSPITAL_COMMUNITY): Payer: Self-pay

## 2011-09-02 NOTE — Progress Notes (Signed)
Patient ID: Kara Walker, female   DOB: [birth year], 21 y.o.   MRN: [id number] I phoned the patient and left a message reminding her that we had an appointment scheduled for 1:30 this afternoon. I requested she phone me and confirm whether she intends to appear for our session. The patient did not phone nor did she appear for the scheduled session. Later in the day I received a call from Lenoard Aden, the social worker at the [facility] the patient is enrolled in to complete her GED. Cyndia Bent reported that the patient had arrived late yesterday afternoon and missed a 2 pm exam. When the patient finally arrived, the instructor noted she seemed unusually friendly and laughing and wondered whether she had been using. We discussed the time frame for the patient's completion of this program and the need for her to resume her full studies in order to graduate. I agreed to contact this social worker on Monday (they do not hold classes on Fridays) and share with her the plan for this patient relative to treatment. It may be necessary to discharge the patient and let her return to full-time classes in this special program. I will wait to speak with the patient tomorrow in group, if she appears for the scheduled group session at 1 pm.

## 2011-09-02 NOTE — Progress Notes (Signed)
Patient ID: Kara Walker, female   DOB: December 31, 1990, 21 y.o.   MRN: [id number] The patient arrived early for group and asked to speak with me. She requested that she be allowed to leave at 2:30 because she had a job interview. I asked why she had not told them she had a previous commitment and set up the appt for tomorrow morning? The patient reported she didn't want to miss out on any chance for the job. I reminded her of the priority her recovery should take in her life and pointed out she hasn't done very well in our program nor has she taken our requests and recommendations. The patient seemed irritated and reported that she has reduced her use considerably and feels like she is doing much better. I explained that if she has reduced her opiate use it is only because she is trying to and somewhat lucky - there have been no behavioral changes made in her daily life to promote abstinence ongoing. The patient reported she was really aggravated and I encouraged her to share these feelings with the group. Early in the group discussion I asked the patient a question and she admitted she had not been paying attention. The patient did provide a UA, but left about 45 minutes into the group. This will go down as an unexcused absence.

## 2011-09-05 ENCOUNTER — Other Ambulatory Visit (HOSPITAL_COMMUNITY): Payer: Self-pay

## 2011-09-05 LAB — CANNABANOIDS (GC/LC/MS), URINE: THC-COOH (GC/LC/MS), ur confirm: 90 NG/ML — ABNORMAL HIGH

## 2011-09-07 ENCOUNTER — Other Ambulatory Visit (HOSPITAL_COMMUNITY): Payer: Self-pay

## 2011-09-09 ENCOUNTER — Other Ambulatory Visit (HOSPITAL_COMMUNITY): Payer: Self-pay | Attending: Physician Assistant

## 2011-09-12 ENCOUNTER — Encounter (HOSPITAL_COMMUNITY): Payer: Self-pay | Admitting: *Deleted

## 2011-09-12 ENCOUNTER — Emergency Department (HOSPITAL_COMMUNITY)
Admission: EM | Admit: 2011-09-12 | Discharge: 2011-09-12 | Disposition: A | Discharge status: Home / Self Care | Payer: Self-pay | Attending: Emergency Medicine | Admitting: Emergency Medicine

## 2011-09-12 ENCOUNTER — Other Ambulatory Visit (HOSPITAL_COMMUNITY): Payer: Self-pay

## 2011-09-12 DIAGNOSIS — J020 Streptococcal pharyngitis: Secondary | ICD-10-CM | POA: Insufficient documentation

## 2011-09-12 LAB — MONONUCLEOSIS SCREEN: Mono Screen: NEGATIVE

## 2011-09-12 MED ORDER — HYDROCODONE-ACETAMINOPHEN 7.5-500 MG/15ML PO SOLN
15.0000 mL | Freq: Four times a day (QID) | ORAL | Status: AC | PRN
Start: 2011-09-12 — End: 2011-09-22

## 2011-09-12 MED ORDER — HYDROCODONE-ACETAMINOPHEN 7.5-500 MG/15ML PO SOLN
10.0000 mL | Freq: Once | ORAL | Status: AC
  Administered 2011-09-12: 10 mL via ORAL
  Filled 2011-09-12: qty 15

## 2011-09-12 MED ORDER — PENICILLIN G BENZATHINE 1200000 UNIT/2ML IM SUSP
1.2000 10*6.[IU] | Freq: Once | INTRAMUSCULAR | Status: AC
  Administered 2011-09-12: 1.2 10*6.[IU] via INTRAMUSCULAR
  Filled 2011-09-12: qty 2

## 2011-09-12 MED ORDER — DEXAMETHASONE SODIUM PHOSPHATE 10 MG/ML IJ SOLN
10.0000 mg | Freq: Once | INTRAMUSCULAR | Status: DC
  Filled 2011-09-12: qty 1

## 2011-09-12 MED ORDER — DEXAMETHASONE SODIUM PHOSPHATE 10 MG/ML IJ SOLN
10.0000 mg | Freq: Once | INTRAMUSCULAR | Status: AC
  Administered 2011-09-12: 10 mg via INTRAMUSCULAR

## 2011-09-12 NOTE — Discharge Instructions (Signed)

## 2011-09-12 NOTE — ED Provider Notes (Signed)
History     CSN: [id number]  Arrival date & time 09/12/11  0241   First MD Initiated Contact with Patient 09/12/11 [phone]      Chief Complaint  Patient presents with  . Sore Throat    (Consider location/radiation/quality/duration/timing/severity/associated sxs/prior treatment) HPI Comments: Patient with a history of strep throat presents emergency department with chief complaint of sore throat x3 weeks.  Patient states that her throat worsened this evening. she denies fevers, night sweats, chills, cough, dental work, difficulty breathing, feeling like her throat is closing.  Patient is a 21 y.o. female presenting with pharyngitis. The history is provided by the patient.  Sore Throat This is a new problem. Associated symptoms include a sore throat. Pertinent negatives include no abdominal pain, anorexia, arthralgias, change in bowel habit, chest pain, chills, congestion, coughing, diaphoresis, fatigue, fever, headaches, joint swelling, myalgias, nausea, neck pain, numbness, rash, swollen glands, urinary symptoms, vertigo, visual change, vomiting or weakness. The symptoms are aggravated by drinking, eating and swallowing. She has tried nothing for the symptoms.    Past Medical History  Diagnosis Date  . UTI (urinary tract infection)     Past Surgical History  Procedure Date  . No past surgeries     History reviewed. No pertinent family history.  History  Substance Use Topics  . Smoking status: Former Smoker    Types: Cigarettes    Quit date: 04/14/2011  . Smokeless tobacco: Not on file  . Alcohol Use: No    OB History    Grav Para Term Preterm Abortions TAB SAB Ect Mult Living                  Review of Systems  Constitutional: Negative for fever, chills, diaphoresis and fatigue.  HENT: Positive for sore throat. Negative for congestion and neck pain.   Respiratory: Negative for cough.   Cardiovascular: Negative for chest pain.  Gastrointestinal: Negative for nausea,  vomiting, abdominal pain, anorexia and change in bowel habit.  Musculoskeletal: Negative for myalgias, joint swelling and arthralgias.  Skin: Negative for rash.  Neurological: Negative for vertigo, weakness, numbness and headaches.  All other systems reviewed and are negative.    Allergies  Review of patient's allergies indicates no known allergies.  Home Medications   Current Outpatient Rx  Name Route Sig Dispense Refill  . SERTRALINE HCL 50 MG PO TABS Oral Take 1 tablet (50 mg total) by mouth daily. 30 tablet 0    BP 121/87  Pulse 82  Temp(Src) 98 F (36.7 C) (Oral)  Resp 16  SpO2 100%  LMP 08/31/2011  Physical Exam  Nursing note and vitals reviewed. Constitutional: She is oriented to person, place, and time. She appears well-developed and well-nourished. No distress.  HENT:  Head: Normocephalic and atraumatic. No trismus in the jaw.  Right Ear: Tympanic membrane, external ear and ear canal normal.  Left Ear: Tympanic membrane, external ear and ear canal normal.  Nose: Nose normal. No rhinorrhea. Right sinus exhibits no maxillary sinus tenderness and no frontal sinus tenderness. Left sinus exhibits no maxillary sinus tenderness and no frontal sinus tenderness.  Mouth/Throat: Uvula is midline and mucous membranes are normal. Normal dentition. No dental abscesses or uvula swelling. Posterior oropharyngeal edema present. No oropharyngeal exudate, posterior oropharyngeal erythema or tonsillar abscesses.       No submental edema, tongue not elevated, no trismus. No impending airway obstruction; Pt able to speak full sentences, swallow intact, no drooling, stridor, or tonsillar/uvula displacement. No palatal petechia  Eyes: Conjunctivae are normal.  Neck: Trachea normal, normal range of motion and full passive range of motion without pain. Neck supple. No rigidity. Erythema present. Normal range of motion present. No Brudzinski's sign noted.       Flexion and extension of neck  without pain or difficulty. Able to breath without difficulty in extension.  Cardiovascular: Normal rate and regular rhythm.   Pulmonary/Chest: Effort normal and breath sounds normal. No stridor. No respiratory distress. She has no wheezes.  Abdominal: Soft. There is no tenderness.       No obvious evidence of splenomegaly. Non ttp.   Musculoskeletal: Normal range of motion.  Lymphadenopathy:       Head (right side): No preauricular and no posterior auricular adenopathy present.       Head (left side): No preauricular and no posterior auricular adenopathy present.    She has cervical adenopathy.  Neurological: She is alert and oriented to person, place, and time.  Skin: Skin is warm and dry. No rash noted. She is not diaphoretic.  Psychiatric: She has a normal mood and affect.    ED Course  Procedures (including critical care time)  Labs Reviewed  RAPID STREP SCREEN - Abnormal; Notable for the following:    Streptococcus, Group A Screen (Direct) POSITIVE (*)    All other components within normal limits  MONONUCLEOSIS SCREEN   No results found.   No diagnosis found.    MDM  Strep Throat  Patient was in stable and in no acute distress prior to discharge.  Patient treated with Decadron and penicillin shot was emergency department.  She'll be sent home with pain medication. Pt advised to follow up w her PCP or return to ED if s/s do not improve or worsen.           Jaci Carrel, [place] 09/12/11 [phone]

## 2011-09-12 NOTE — ED Provider Notes (Signed)
Medical screening examination/treatment/procedure(s) were conducted as a shared visit with non-physician practitioner(s) and myself.  I personally evaluated the patient during the encounter  Please see my separate respective documentation pertaining to this patient encounter   Vida Roller, MD 09/12/11 [phone]

## 2011-09-12 NOTE — ED Notes (Signed)
Sore throat x 3 weeks. Worse tonight at 0200.

## 2011-09-12 NOTE — ED Notes (Signed)
21 year old female with approximately 3 weeks of sore throat, generalized fatigue. She denies abdominal pain, nausea or vomiting. She does have difficulty swallowing due to sore throat. Symptoms are persistent, just don't seem to be going away.  Physical exam:  Oropharynx is clear, bilateral tonsillar hypertrophy and mild erythema without exudate or asymmetry. Because membranes are moist, abdomen soft and nontender without hepatosplenomegaly, cardiac rhythm is a normal sinus rhythm without ectopy at a normal rate and no murmurs. Lungs are clear without rales wheezing or increased work of breathing  Assessment:  Pharyngitis, Decadron ordered, no signs of peritonsillar abscess, no exudate or significant lymphadenopathy to suggest significant cause. No pain in the neck, no palpable pain along the jugular, strep and mono test pending. Anticipate discharge and is otherwise healthy appearing female  Medical screening examination/treatment/procedure(s) were conducted as a shared visit with non-physician practitioner(s) and myself.  I personally evaluated the patient during the encounter   Vida Roller, MD 09/12/11 [phone]

## 2011-09-14 ENCOUNTER — Other Ambulatory Visit (HOSPITAL_COMMUNITY): Payer: Self-pay

## 2011-09-16 ENCOUNTER — Other Ambulatory Visit (HOSPITAL_COMMUNITY): Payer: Self-pay

## 2011-10-23 ENCOUNTER — Other Ambulatory Visit (HOSPITAL_COMMUNITY): Payer: Self-pay | Admitting: Physician Assistant

## 2011-11-25 ENCOUNTER — Encounter (HOSPITAL_COMMUNITY): Payer: Self-pay

## 2011-11-25 ENCOUNTER — Emergency Department (INDEPENDENT_AMBULATORY_CARE_PROVIDER_SITE_OTHER)
Admission: EM | Admit: 2011-11-25 | Discharge: 2011-11-25 | Disposition: A | Discharge status: Home / Self Care | Payer: Self-pay | Source: Home / Self Care | Attending: Emergency Medicine | Admitting: Emergency Medicine

## 2011-11-25 DIAGNOSIS — N390 Urinary tract infection, site not specified: Secondary | ICD-10-CM

## 2011-11-25 LAB — POCT URINALYSIS DIP (DEVICE)
Glucose, UA: NEGATIVE mg/dL
Ketones, ur: 15 mg/dL — AB
Protein, ur: 100 mg/dL — AB
Specific Gravity, Urine: >=1.03 (ref 1.005–1.030)

## 2011-11-25 LAB — POCT PREGNANCY, URINE: Preg Test, Ur: NEGATIVE

## 2011-11-25 MED ORDER — CEPHALEXIN 500 MG PO CAPS: 500.0000 mg | ORAL_CAPSULE | Freq: Four times a day (QID) | ORAL | Status: AC

## 2011-11-25 NOTE — ED Provider Notes (Signed)
History     CSN: [id number]  Arrival date & time 11/25/11  1346   First MD Initiated Contact with Patient 11/25/11 1348      Chief Complaint  Patient presents with  . Urinary Tract Infection    (Consider location/radiation/quality/duration/timing/severity/associated sxs/prior treatment) HPI Comments: Patient has been having discomfort and increased frequency with urination and burning for the last 2 weeks. She has been using over-the-counter medicines and at times has felt better. Symptoms seem to have returned within the last couple days much worse. Patient denies any fevers, flank pain, nausea vomiting. No vaginal discharge or bleeding.  Patient denies any constitutional symptoms such as fevers, fatigue generalized malaise or body aches.  Patient is a 21 y.o. female presenting with urinary tract infection. The history is provided by the patient.  Urinary Tract Infection This is a new problem. The problem has not changed since onset.Pertinent negatives include no chest pain and no shortness of breath. The symptoms are relieved by nothing. She has tried nothing for the symptoms. The treatment provided no relief.    Past Medical History  Diagnosis Date  . UTI (urinary tract infection)     Past Surgical History  Procedure Date  . No past surgeries     History reviewed. No pertinent family history.  History  Substance Use Topics  . Smoking status: Former Smoker    Types: Cigarettes    Quit date: 04/14/2011  . Smokeless tobacco: Not on file  . Alcohol Use: No    OB History    Grav Para Term Preterm Abortions TAB SAB Ect Mult Living                  Review of Systems  Constitutional: Positive for fever, chills, activity change, appetite change and fatigue.  HENT: Negative for ear pain.   Respiratory: Negative for cough and shortness of breath.   Cardiovascular: Negative for chest pain, palpitations and leg swelling.  Genitourinary: Positive for dysuria, urgency,  frequency and hematuria. Negative for flank pain, vaginal bleeding, vaginal discharge, genital sores and vaginal pain.    Allergies  Review of patient's allergies indicates no known allergies.  Home Medications   Current Outpatient Rx  Name Route Sig Dispense Refill  . CEPHALEXIN 500 MG PO CAPS Oral Take 1 capsule (500 mg total) by mouth 4 (four) times daily. 20 capsule 0  . SERTRALINE HCL 50 MG PO TABS Oral Take 1 tablet (50 mg total) by mouth daily. 30 tablet 0    BP 102/70  Pulse 114  Temp(Src) 98.8 F (37.1 C) (Oral)  Resp 16  SpO2 97%  LMP 11/11/2011  Physical Exam  Nursing note and vitals reviewed. Constitutional: She appears well-developed and well-nourished.  HENT:  Head: Normocephalic.  Mouth/Throat: No oropharyngeal exudate.  Eyes: Conjunctivae are normal. No scleral icterus.  Neck: Neck supple.  Abdominal: Soft. She exhibits no mass. There is no hepatosplenomegaly. There is tenderness in the suprapubic area. There is no rigidity, no rebound, no guarding, no CVA tenderness, no tenderness at McBurney's point and negative Murphy's sign.  Musculoskeletal: She exhibits tenderness.  Neurological: She is alert.  Skin: No rash noted.    ED Course  Procedures (including critical care time)  Labs Reviewed  POCT URINALYSIS DIP (DEVICE) - Abnormal; Notable for the following:    Bilirubin Urine SMALL (*)    Ketones, ur 15 (*)    Hgb urine dipstick MODERATE (*)    Protein, ur 100 (*)    Leukocytes,  UA LARGE (*) Biochemical Testing Only. Please order routine urinalysis from main lab if confirmatory testing is needed.   All other components within normal limits  POCT PREGNANCY, URINE  URINE CULTURE   No results found.   1. Urinary tract infection       MDM  Uncomplicated urinary tract infection. Antibiotics initiated sample obtained for cultures. Otherwise patient to return and discuss symptoms that warrant further evaluation or treatment. Should agree with  treatment plan and followup care as necessary      Jimmie Molly, MD 11/25/11 1436

## 2011-11-25 NOTE — Discharge Instructions (Signed)
  Start with antibiotics start drinking fluids.   Urinary Tract Infection Infections of the urinary tract can start in several places. A bladder infection (cystitis), a kidney infection (pyelonephritis), and a prostate infection (prostatitis) are different types of urinary tract infections (UTIs). They usually get better if treated with medicines (antibiotics) that kill germs. Take all the medicine until it is gone. You or your child may feel better in a few days, but TAKE ALL MEDICINE or the infection may not respond and may become more difficult to treat. HOME CARE INSTRUCTIONS   Drink enough water and fluids to keep the urine clear or pale yellow. Cranberry juice is especially recommended, in addition to large amounts of water.   Avoid caffeine, tea, and carbonated beverages. They tend to irritate the bladder.   Alcohol may irritate the prostate.   Only take over-the-counter or prescription medicines for pain, discomfort, or fever as directed by your caregiver.  To prevent further infections:  Empty the bladder often. Avoid holding urine for long periods of time.   After a bowel movement, women should cleanse from front to back. Use each tissue only once.   Empty the bladder before and after sexual intercourse.  FINDING OUT THE RESULTS OF YOUR TEST Not all test results are available during your visit. If your or your child's test results are not back during the visit, make an appointment with your caregiver to find out the results. Do not assume everything is normal if you have not heard from your caregiver or the medical facility. It is important for you to follow up on all test results. SEEK MEDICAL CARE IF:   There is back pain.   Your baby is older than 3 months with a rectal temperature of 100.5 F (38.1 C) or higher for more than 1 day.   Your or your child's problems (symptoms) are no better in 3 days. Return sooner if you or your child is getting worse.  SEEK IMMEDIATE  MEDICAL CARE IF:   There is severe back pain or lower abdominal pain.   You or your child develops chills.   You have a fever.   Your baby is older than 3 months with a rectal temperature of 102 F (38.9 C) or higher.   Your baby is 29 months old or younger with a rectal temperature of 100.4 F (38 C) or higher.   There is nausea or vomiting.   There is continued burning or discomfort with urination.  MAKE SURE YOU:   Understand these instructions.   Will watch your condition.   Will get help right away if you are not doing well or get worse.  Document Released: 04/06/2005 Document Revised: 06/16/2011 Document Reviewed: 11/09/2006 [facility] Patient Information 2012 [place], [place].

## 2011-11-25 NOTE — ED Notes (Signed)
States she has been having frequency, burning w urination for past 2-3 WEEKS, had been treating w OTC medications, w temporary relief

## 2011-11-27 LAB — URINE CULTURE
Colony Count: >=100000
Culture  Setup Time: 201305171530

## 2011-11-28 NOTE — ED Notes (Signed)
Urine culture: >100,000 colonies E. Coli. Pt. adequately treated with Keflex. Kara Walker 11/28/2011  

## 2012-01-18 ENCOUNTER — Emergency Department (HOSPITAL_COMMUNITY)
Admission: EM | Admit: 2012-01-18 | Discharge: 2012-01-18 | Disposition: A | Discharge status: Home / Self Care | Payer: Self-pay | Attending: Emergency Medicine | Admitting: Emergency Medicine

## 2012-01-18 ENCOUNTER — Encounter (HOSPITAL_COMMUNITY): Payer: Self-pay | Admitting: *Deleted

## 2012-01-18 ENCOUNTER — Ambulatory Visit (HOSPITAL_COMMUNITY)
Admission: RE | Admit: 2012-01-18 | Discharge: 2012-01-18 | Disposition: A | Discharge status: Home / Self Care | Payer: Self-pay | Attending: Psychiatry | Admitting: Psychiatry

## 2012-01-18 DIAGNOSIS — F1120 Opioid dependence, uncomplicated: Secondary | ICD-10-CM | POA: Insufficient documentation

## 2012-01-18 DIAGNOSIS — F1910 Other psychoactive substance abuse, uncomplicated: Secondary | ICD-10-CM | POA: Insufficient documentation

## 2012-01-18 DIAGNOSIS — N390 Urinary tract infection, site not specified: Secondary | ICD-10-CM | POA: Insufficient documentation

## 2012-01-18 LAB — URINALYSIS, ROUTINE W REFLEX MICROSCOPIC
Bilirubin Urine: NEGATIVE
Ketones, ur: NEGATIVE mg/dL
Specific Gravity, Urine: 1.019 (ref 1.005–1.030)
Urobilinogen, UA: 0.2 mg/dL (ref 0.0–1.0)
pH: 7.5 (ref 5.0–8.0)

## 2012-01-18 LAB — CBC
MCH: 30.1 pg (ref 26.0–34.0)
MCHC: 35.4 g/dL (ref 30.0–36.0)
Platelets: 254 10*3/uL (ref 150–400)
RDW: 12.8 % (ref 11.5–15.5)

## 2012-01-18 LAB — URINE MICROSCOPIC-ADD ON

## 2012-01-18 LAB — COMPREHENSIVE METABOLIC PANEL
ALT: 13 U/L (ref 0–35)
Albumin: 4 g/dL (ref 3.5–5.2)
Alkaline Phosphatase: 73 U/L (ref 39–117)
Calcium: 9.8 mg/dL (ref 8.4–10.5)
GFR calc Af Amer: >90 mL/min (ref >90)
Glucose, Bld: 102 mg/dL — ABNORMAL HIGH (ref 70–99)
Potassium: 4.2 mEq/L (ref 3.5–5.1)
Sodium: 135 mEq/L (ref 135–145)
Total Protein: 7.5 g/dL (ref 6.0–8.3)

## 2012-01-18 LAB — RAPID URINE DRUG SCREEN, HOSP PERFORMED
Barbiturates: NOT DETECTED
Benzodiazepines: NOT DETECTED
Cocaine: NOT DETECTED
Opiates: POSITIVE — AB
Tetrahydrocannabinol: POSITIVE — AB

## 2012-01-18 MED ORDER — NITROFURANTOIN MONOHYD MACRO 100 MG PO CAPS
100.0000 mg | ORAL_CAPSULE | Freq: Once | ORAL | Status: AC
  Administered 2012-01-18: 100 mg via ORAL
  Filled 2012-01-18: qty 1

## 2012-01-18 MED ORDER — NITROFURANTOIN MONOHYD MACRO 100 MG PO CAPS: 100.0000 mg | ORAL_CAPSULE | Freq: Two times a day (BID) | ORAL | Status: AC

## 2012-01-18 NOTE — BH Assessment (Signed)
Assessment Note   Kara Walker is a 21 y.o. female. Walk in accompanied by Dewayne Hatch a counselor with IOP presenting client for admission for detox.She is using 4bags of Heroin daily, intervenously for 6 months. Prior to Heroin [place] she was using pain medications crushing them and snorting them. She has been inpt 2x at [facility] in the last 1 1/2 years. She reports being tired of using and the lifestyle and is seeking detox to get clean from heroin. She is currently living with her Father but he is out of town but is supportive of her getting help and is welcome to return home.She is unemployed. Appearance is neat clean attractive looking stated age. She is reporting some symptoms of withdrawal including restlessness, stomach upset without diarrhea and some anxiety. Her last use was yesterday at approximately 4pm. After speaking with Lloyd Huger the PA sent to [facility] for medical clearance and to wait for an available detox bed pending medical clearance. Went to ED per security and a behavioral health tech.  Axis I: Adjustment Disorder NOS and opiate dependence Axis II: Deferred Axis III:  Past Medical History  Diagnosis Date  . UTI (urinary tract infection)    Axis IV: occupational problems, other psychosocial or environmental problems and problems with primary support group Axis V: 31-40 impairment in reality testing  Past Medical History:  Past Medical History  Diagnosis Date  . UTI (urinary tract infection)     Past Surgical History  Procedure Date  . No past surgeries     Family History: No family history on file.  Social History:  reports that she quit smoking about 9 months ago. Her smoking use included Cigarettes. She does not have any smokeless tobacco history on file. She reports that she does not drink alcohol or use illicit drugs.  Additional Social History:  Alcohol / Drug Use Pain Medications: hx of abusing opitates snorting Prescriptions: not abusing Over the Counter: not abusing History of  alcohol / drug use?: Yes Substance #1 Name of Substance 1: heroin 1 - Age of First Use: 20 1 - Amount (size/oz): 4 bags qd 1 - Frequency: daily 1 - Duration: 6 months 1 - Last Use / Amount: 4pm 01/17/2012  CIWA:   COWS: Clinical Opiate Withdrawal Scale (COWS) Sweating: No report of chills or flushing Restlessness: Reports difficulty sitting still, but is able to do so Pupil Size: Pupils pinned or normal size for room light Bone or Joint Aches: Mild diffuse discomfort Runny Nose or Tearing: Not present GI Upset: Stomach cramps Tremor: Tremor can be felt, but not observed Yawning: No yawning Anxiety or Irritability: None Gooseflesh Skin: Skin is smooth  Allergies: No Known Allergies  Home Medications:  (Not in a hospital admission)  OB/GYN Status:  No LMP recorded.  General Assessment Data Location of Assessment: [facility] Assessment Services Living Arrangements: Parent Can pt return to current living arrangement?: Yes Admission Status: Voluntary Is patient capable of signing voluntary admission?: Yes Transfer from: Home Referral Source: Self/Family/Friend  Education Status Is patient currently in school?: No  Risk to self Suicidal Ideation: No Suicidal Intent: No Is patient at risk for suicide?: No Suicidal Plan?: No Access to Means: No What has been your use of drugs/alcohol within the last 12 months?: daily use of heroin Previous Attempts/Gestures: No Intentional Self Injurious Behavior: None Family Suicide History: Unknown Persecutory voices/beliefs?: No Depression: Yes Depression Symptoms: Loss of interest in usual pleasures;Feeling worthless/self pity;Fatigue Substance abuse history and/or treatment for substance abuse?: Yes Suicide prevention  information given to non-admitted patients: Not applicable  Risk to Others Homicidal Ideation: No Thoughts of Harm to Others: No Current Homicidal Intent: No Current Homicidal Plan: No Access to Homicidal Means:  No History of harm to others?: No Assessment of Violence: None Noted Does patient have access to weapons?: No Criminal Charges Pending?: No Does patient have a court date: No  Psychosis Hallucinations: None noted Delusions: None noted  Mental Status Report Appear/Hygiene:  (unremarkable) Eye Contact: Fair Motor Activity: Restlessness Speech: Soft;Logical/coherent Level of Consciousness: Alert Mood: Sad Affect: Sad Anxiety Level: Minimal Thought Processes: Coherent;Relevant Judgement: Unimpaired Orientation: Person;Place;Time;Situation Obsessive Compulsive Thoughts/Behaviors: None  Cognitive Functioning Concentration: Normal Memory: Recent Intact;Remote Intact IQ: Average Insight: Fair Impulse Control: Fair Appetite: Fair Sleep: No Change Total Hours of Sleep:  (unknown) Vegetative Symptoms: None  ADLScreening [facility] Assessment Services) Patient's cognitive ability adequate to safely complete daily activities?: Yes Patient able to express need for assistance with ADLs?: Yes Independently performs ADLs?: Yes  Abuse/Neglect [facility]) Physical Abuse: Denies Verbal Abuse: Denies Sexual Abuse: Denies  Prior Inpatient Therapy Prior Inpatient Therapy: Yes Prior Therapy Dates: 1 and 1/2 years ago Prior Therapy Facilty/Provider(s): [facility] Reason for Treatment: opiate use  Prior Outpatient Therapy Prior Outpatient Therapy: Yes Prior Therapy Dates: 6 months ago Prior Therapy Facilty/Provider(s): [facility] Reason for Treatment: opiate dependence  ADL Screening (condition at time of admission) Patient's cognitive ability adequate to safely complete daily activities?: Yes Patient able to express need for assistance with ADLs?: Yes Independently performs ADLs?: Yes  Home Assistive Devices/Equipment Home Assistive Devices/Equipment: None    Abuse/Neglect Assessment (Assessment to be complete while patient is alone) Physical Abuse: Denies Verbal Abuse: Denies Sexual Abuse:  Denies     Advance Directives (For Healthcare) Advance Directive: Patient does not have advance directive Pre-existing out of facility DNR order (yellow form or pink MOST form): No Nutrition Screen Unintentional weight loss greater than 10lbs within the last month: No Problems chewing or swallowing foods and/or liquids: No Home Tube Feeding or Total Parenteral Nutrition (TPN): No Patient appears severely malnourished: No Pregnant or Lactating: No        Disposition:  Disposition Disposition of Patient: Referred to (wled for medical clearance)  On Site Evaluation by:   Reviewed with Physician:     Wynona Luna 01/18/2012 11:33 AM

## 2012-01-18 NOTE — ED Notes (Signed)
Pt.belonging are in the TCU locker#34,2 BAGS,1 BAG IN ACTIVITY ROOM

## 2012-01-18 NOTE — ED Provider Notes (Signed)
Medical screening examination/treatment/procedure(s) were performed by non-physician practitioner and as supervising physician I was immediately available for consultation/collaboration.  Doug Sou, MD 01/18/12 [phone]

## 2012-01-18 NOTE — ED Notes (Signed)
Pt states she is here for detox and now no longer wants it.  Denies SI/HI.

## 2012-01-18 NOTE — ED Notes (Addendum)
Pt has 3 bag of belonging in triage locker#4 2 white color rings placed in locker 4 with belongings

## 2012-01-18 NOTE — ED Provider Notes (Signed)
History     CSN: [id number]  Arrival date & time 01/18/12  1126   First MD Initiated Contact with Patient 01/18/12 1132      Chief Complaint  Patient presents with  . Medical Clearance    (Consider location/radiation/quality/duration/timing/severity/associated sxs/prior treatment) Patient is a 21 y.o. female presenting with drug/alcohol assessment. The history is provided by the patient.  Drug / Alcohol Assessment Primary symptoms include patient does not experience confusion and no weakness. Pertinent negatives include no fever.  Pt states she wants a detox for heroin and pain medication use. States has been using daily for 5 mon. Hx of detox in the past. Denies alcohol. Denies depression, SI, HI. Denies medical problems. No other complaints.   Past Medical History  Diagnosis Date  . UTI (urinary tract infection)     Past Surgical History  Procedure Date  . No past surgeries     No family history on file.  History  Substance Use Topics  . Smoking status: Former Smoker    Types: Cigarettes    Quit date: 04/14/2011  . Smokeless tobacco: Not on file  . Alcohol Use: No    OB History    Grav Para Term Preterm Abortions TAB SAB Ect Mult Living                  Review of Systems  Constitutional: Negative for fever and chills.  Respiratory: Negative.   Cardiovascular: Negative.   Gastrointestinal: Negative.   Genitourinary: Negative for dysuria, vaginal bleeding and vaginal discharge.  Musculoskeletal: Negative.   Skin: Negative.   Neurological: Negative for dizziness, weakness and headaches.  Psychiatric/Behavioral: Negative for suicidal ideas, behavioral problems and confusion. The patient is not nervous/anxious.     Allergies  Review of patient's allergies indicates no known allergies.  Home Medications  No current outpatient prescriptions on file.  BP 109/79  Pulse 80  Temp 98.4 F (36.9 C) (Oral)  Resp 18  SpO2 100%  LMP 01/10/2012  Physical Exam   Nursing note and vitals reviewed. Constitutional: She is oriented to person, place, and time. She appears well-developed and well-nourished. No distress.  Eyes: Conjunctivae are normal.  Cardiovascular: Normal rate, regular rhythm and normal heart sounds.   Pulmonary/Chest: Effort normal and breath sounds normal. No respiratory distress. She has no wheezes. She has no rales.  Abdominal: Soft. Bowel sounds are normal. She exhibits no distension. There is no tenderness. There is no rebound.  Neurological: She is alert and oriented to person, place, and time.  Skin: Skin is warm.  Psychiatric: She has a normal mood and affect. Her behavior is normal.    ED Course  Procedures (including critical care time)  Pt with heroin, substance abuse, wanting detox. No SI or HI.   Results for orders placed during the hospital encounter of 01/18/12  CBC      Component Value Range   WBC 7.9  4.0 - 10.5 K/uL   RBC 4.99  3.87 - 5.11 MIL/uL   Hemoglobin 15.0  12.0 - 15.0 g/dL   HCT 78.2  95.6 - 21.3 %   MCV 85.0  78.0 - 100.0 fL   MCH 30.1  26.0 - 34.0 pg   MCHC 35.4  30.0 - 36.0 g/dL   RDW 08.6  57.8 - 46.9 %   Platelets 254  150 - 400 K/uL  COMPREHENSIVE METABOLIC PANEL      Component Value Range   Sodium 135  135 - 145 mEq/L  Potassium 4.2  3.5 - 5.1 mEq/L   Chloride 100  96 - 112 mEq/L   CO2 28  19 - 32 mEq/L   Glucose, Bld 102 (*) 70 - 99 mg/dL   BUN 8  6 - 23 mg/dL   Creatinine, Ser 1.61  0.50 - 1.10 mg/dL   Calcium 9.8  8.4 - 09.6 mg/dL   Total Protein 7.5  6.0 - 8.3 g/dL   Albumin 4.0  3.5 - 5.2 g/dL   AST 16  0 - 37 U/L   ALT 13  0 - 35 U/L   Alkaline Phosphatase 73  39 - 117 U/L   Total Bilirubin 0.2 (*) 0.3 - 1.2 mg/dL   GFR calc non Af Amer >90  >90 mL/min   GFR calc Af Amer >90  >90 mL/min  ETHANOL      Component Value Range   Alcohol, Ethyl (B) <11  0 - 11 mg/dL  ACETAMINOPHEN LEVEL      Component Value Range   Acetaminophen (Tylenol), Serum <15.0  10 - 30 ug/mL  URINE  RAPID DRUG SCREEN (HOSP PERFORMED)      Component Value Range   Opiates POSITIVE (*) NONE DETECTED   Cocaine NONE DETECTED  NONE DETECTED   Benzodiazepines NONE DETECTED  NONE DETECTED   Amphetamines NONE DETECTED  NONE DETECTED   Tetrahydrocannabinol POSITIVE (*) NONE DETECTED   Barbiturates NONE DETECTED  NONE DETECTED  URINALYSIS, ROUTINE W REFLEX MICROSCOPIC      Component Value Range   Color, Urine YELLOW  YELLOW   APPearance CLEAR  CLEAR   Specific Gravity, Urine 1.019  1.005 - 1.030   pH 7.5  5.0 - 8.0   Glucose, UA NEGATIVE  NEGATIVE mg/dL   Hgb urine dipstick MODERATE (*) NEGATIVE   Bilirubin Urine NEGATIVE  NEGATIVE   Ketones, ur NEGATIVE  NEGATIVE mg/dL   Protein, ur NEGATIVE  NEGATIVE mg/dL   Urobilinogen, UA 0.2  0.0 - 1.0 mg/dL   Nitrite POSITIVE (*) NEGATIVE   Leukocytes, UA SMALL (*) NEGATIVE  POCT PREGNANCY, URINE      Component Value Range   Preg Test, Ur NEGATIVE  NEGATIVE  URINE MICROSCOPIC-ADD ON      Component Value Range   Squamous Epithelial / LPF FEW (*) RARE   WBC, UA 7-10  <3 WBC/hpf   RBC / HPF 11-20  <3 RBC/hpf   Bacteria, UA MANY (*) RARE   Urine-Other MUCOUS PRESENT     No results found.  Pt medically cleared. Has a UTI, will start on macrobid. Spoke with act will assess.   2:27 PM Pt just informed nursing staff that she wants to go home. She is again, no SI or HI. She is in no disterss. Will d/c home Outpatient resources provided.     MDM         Lottie Mussel, PA 01/18/12 1627

## 2012-01-18 NOTE — ED Notes (Addendum)
Requesting detox for drugs, heroin, last use yesterday, 4 bags-normal amt per day, pt feeling anxious and has some stomach discomfort

## 2012-01-19 NOTE — Progress Notes (Signed)
Patient ID: Kara Walker, female   DOB: 12-15-1990, 21 y.o.   MRN: [id number] Type of Therapy: Processing  Participation Level:  Active  Minimal  None  Did Not Attend  Participation Quality: Appropriate  Attentive  Sharing  Intrusive  Resistant  Affect: Appropriate  Excited  Anxious   Depressed  Irritable  Angry  Labile  Tearful   Cognitive: Approprate  Insight:  None   Poor   Limited    Good   Engagement in Group:  None Limited   Good   Modes of Intervention: Clarification, Education, Support, Exploration, Activity   Summary of Progress/Problems:(late entry for 01/16/12) pt discussed ways she can try to regain her parents trust since they found out about her eating habits.    Keyra Virella Angelique Blonder

## 2012-01-26 ENCOUNTER — Inpatient Hospital Stay (HOSPITAL_COMMUNITY)
Admission: RE | Admit: 2012-01-26 | Discharge: 2012-01-30 | DRG: 897 | Disposition: A | Discharge status: Home / Self Care | Payer: Federal, State, Local not specified - Other | Attending: Psychiatry | Admitting: Psychiatry

## 2012-01-26 ENCOUNTER — Encounter (HOSPITAL_COMMUNITY): Payer: Self-pay | Admitting: *Deleted

## 2012-01-26 DIAGNOSIS — F1120 Opioid dependence, uncomplicated: Principal | ICD-10-CM | POA: Diagnosis present

## 2012-01-26 DIAGNOSIS — F4310 Post-traumatic stress disorder, unspecified: Secondary | ICD-10-CM | POA: Diagnosis present

## 2012-01-26 MED ORDER — LOPERAMIDE HCL 2 MG PO CAPS: 2.0000 mg | ORAL_CAPSULE | ORAL | Status: DC | PRN

## 2012-01-26 MED ORDER — ONDANSETRON 4 MG PO TBDP: 4.0000 mg | ORAL_TABLET | Freq: Four times a day (QID) | ORAL | Status: DC | PRN

## 2012-01-26 MED ORDER — ACETAMINOPHEN 325 MG PO TABS: 650.0000 mg | ORAL_TABLET | Freq: Four times a day (QID) | ORAL | Status: DC | PRN

## 2012-01-26 MED ORDER — NAPROXEN 500 MG PO TABS
500.0000 mg | ORAL_TABLET | Freq: Two times a day (BID) | ORAL | Status: DC | PRN
  Administered 2012-01-26: 500 mg via ORAL
  Filled 2012-01-26: qty 1

## 2012-01-26 MED ORDER — DICYCLOMINE HCL 20 MG PO TABS: 20.0000 mg | ORAL_TABLET | Freq: Four times a day (QID) | ORAL | Status: DC | PRN

## 2012-01-26 MED ORDER — CLONIDINE HCL 0.1 MG PO TABS
0.1000 mg | ORAL_TABLET | ORAL | Status: AC
  Administered 2012-01-28 – 2012-01-29 (×4): 0.1 mg via ORAL
  Filled 2012-01-26 (×4): qty 1

## 2012-01-26 MED ORDER — HYDROXYZINE HCL 25 MG PO TABS
25.0000 mg | ORAL_TABLET | Freq: Four times a day (QID) | ORAL | Status: DC | PRN
  Administered 2012-01-26 – 2012-01-28 (×4): 25 mg via ORAL
  Filled 2012-01-26 (×2): qty 1

## 2012-01-26 MED ORDER — ALUM & MAG HYDROXIDE-SIMETH 200-200-20 MG/5ML PO SUSP: 30.0000 mL | ORAL | Status: DC | PRN

## 2012-01-26 MED ORDER — MAGNESIUM HYDROXIDE 400 MG/5ML PO SUSP: 30.0000 mL | Freq: Every day | ORAL | Status: DC | PRN

## 2012-01-26 MED ORDER — CLONIDINE HCL 0.1 MG PO TABS
0.1000 mg | ORAL_TABLET | Freq: Every day | ORAL | Status: DC
  Administered 2012-01-30: 0.1 mg via ORAL
  Filled 2012-01-26 (×2): qty 1

## 2012-01-26 MED ORDER — METHOCARBAMOL 500 MG PO TABS: 500.0000 mg | ORAL_TABLET | Freq: Three times a day (TID) | ORAL | Status: DC | PRN

## 2012-01-26 MED ORDER — CLONIDINE HCL 0.1 MG PO TABS
0.1000 mg | ORAL_TABLET | Freq: Four times a day (QID) | ORAL | Status: AC
  Administered 2012-01-26 – 2012-01-27 (×6): 0.1 mg via ORAL
  Filled 2012-01-26 (×9): qty 1

## 2012-01-26 NOTE — H&P (Signed)
Pt seen and evaluated upon admission.  Completed Admission Suicide Risk Assessment.  See orders.  Pt agreeable with plan.  Discussed with team.   

## 2012-01-26 NOTE — Tx Team (Deleted)
Initial Interdisciplinary Treatment Plan  PATIENT STRENGTHS: (choose at least two) Ability for insight Average or above average intelligence Capable of independent living Communication skills General fund of knowledge Motivation for treatment/growth Physical Health Supportive family/friends Work skills  PATIENT STRESSORS:    PROBLEM LIST: Problem List/Patient Goals   Date to be addressed Date deferred Reason deferred Estimated date of resolution  Substance Abuse (Heroin use)    Discharge  Grief (loss of mother in 2013)    Discharge                                             DISCHARGE CRITERIA:  Ability to meet basic life and health needs Adequate post-discharge living arrangements Improved stabilization in mood, thinking, and/or behavior Motivation to continue treatment in a less acute level of care Withdrawal symptoms are absent or subacute and managed without 24-hour nursing intervention  PRELIMINARY DISCHARGE PLAN: Attend aftercare/continuing care group Attend 12-step recovery group Outpatient therapy Return to previous living arrangement Referrals indicated:    PATIENT/FAMIILY INVOLVEMENT: This treatment plan has been presented to and reviewed with the patient, Kara Walker. The patient and family have been given the opportunity to ask questions and make suggestions.  Cranford Mon 01/26/2012, 3:05 PM

## 2012-01-26 NOTE — BH Assessment (Signed)
Assessment Note   Kara Walker is an 21 y.o. female. Referred for an assessment by Dewayne Hatch in [facility] and Marciana states she was told to come in this am at 8am. She is accompanied by her Father who was present for most of the interview. She is seeking admission for opiate detox. She asked her Father to leave the room to tell writer that she had in fact used heroin as recently as yesterday 5 bags when with her father present she had said she hasnt used since sun. She is not indicating nor am I observing any withdrawal sx. Last use about 1500 on 01/25/12. Had labs done last week in [facility] but unable to stay for the actual disposition, stated she became restless and needed to use and couldn't wait out the process. Charmian Muff from IOP came up to admitting and agreed to pursue a scholarship for patient to cover eval fee and her care when she returns to IOP on discharge. Denies being homicidal or suicidal and is not psychotic. Is tearful during part of the interview, when asked why she was crying she stated she did not know. Dr. Margot Chimes agreed to accept her to adult unit.  Axis I: opiate dependence Axis II: Deferred Axis III:  Past Medical History  Diagnosis Date  . UTI (urinary tract infection)    Axis IV: economic problems, occupational problems and other psychosocial or environmental problems Axis V: GAF= 33  Past Medical History:  Past Medical History  Diagnosis Date  . UTI (urinary tract infection)     Past Surgical History  Procedure Date  . No past surgeries     Family History: No family history on file.  Social History:  reports that she has been smoking Cigarettes.  She does not have any smokeless tobacco history on file. She reports that she uses illicit drugs (Heroin) about 7 times per week. She reports that she does not drink alcohol.  Additional Social History:  Alcohol / Drug Use Pain Medications: none Prescriptions: none Over the Counter: none History of alcohol / drug use?:  Yes Longest period of sobriety (when/how long): none Withdrawal Symptoms: Fever / Chills;Sweats;Nausea / Vomiting;Cramps Substance #1 Name of Substance 1: heroin 1 - Age of First Use: 20 1 - Amount (size/oz): 6 bags daily 1 - Frequency: daily 1 - Duration: 6 monts 1 - Last Use / Amount: yesterday 5 bags  CIWA:   COWS: Clinical Opiate Withdrawal Scale (COWS) Resting Pulse Rate: Pulse Rate 80 or below Sweating: Subjective report of chills or flushing Restlessness: Reports difficulty sitting still, but is able to do so Pupil Size: Pupils possibly larger than normal for room light Bone or Joint Aches: Mild diffuse discomfort Runny Nose or Tearing: Not present GI Upset: nausea or loose stool Tremor: No tremor Yawning: No yawning Anxiety or Irritability: Patient reports increasing irritability or anxiousness Gooseflesh Skin: Piloerection of skin can be felt or hairs standing up on arms COWS Total Score: 10   Allergies: No Known Allergies  Home Medications:  Medications Prior to Admission  Medication Sig Dispense Refill  . nitrofurantoin, macrocrystal-monohydrate, (MACROBID) 100 MG capsule Take 1 capsule (100 mg total) by mouth 2 (two) times daily.  10 capsule  0    OB/GYN Status:  Patient's last menstrual period was 01/10/2012.  General Assessment Data Location of Assessment: [facility] Assessment Services ACT Assessment: Yes Living Arrangements: Parent Can pt return to current living arrangement?: Yes Admission Status: Voluntary Is patient capable of signing voluntary admission?: Yes Transfer  from: Home Referral Source: Self/Family/Friend  Education Status Is patient currently in school?: No  Risk to self Suicidal Ideation: No Suicidal Intent: No Is patient at risk for suicide?: No Suicidal Plan?: No Access to Means: No What has been your use of drugs/alcohol within the last 12 months?: daily heroin use Previous Attempts/Gestures: No Intentional Self Injurious Behavior:  None Family Suicide History: No Persecutory voices/beliefs?: No Depression: Yes Depression Symptoms: Tearfulness;Loss of interest in usual pleasures;Guilt;Feeling worthless/self pity Substance abuse history and/or treatment for substance abuse?: Yes Suicide prevention information given to non-admitted patients: Not applicable  Risk to Others Homicidal Ideation: No Thoughts of Harm to Others: No Current Homicidal Intent: No Current Homicidal Plan: No Access to Homicidal Means: No History of harm to others?: No Assessment of Violence: None Noted Does patient have access to weapons?: No Criminal Charges Pending?: No Does patient have a court date: No  Psychosis Hallucinations: None noted Delusions: None noted  Mental Status Report Appear/Hygiene:  (unremarkable) Eye Contact: Good Motor Activity: Restlessness Speech: Logical/coherent Level of Consciousness: Alert Mood: Sad Affect: Sad Anxiety Level: Minimal Thought Processes: Coherent;Relevant Judgement: Impaired Orientation: Person;Time;Place;Situation Obsessive Compulsive Thoughts/Behaviors: None  Cognitive Functioning Concentration: Decreased Memory: Recent Intact;Remote Intact IQ: Average Insight: Fair Impulse Control: Fair Appetite: Fair Sleep: No Change Total Hours of Sleep:  (unknown) Vegetative Symptoms: None  ADLScreening [facility] Assessment Services) Patient's cognitive ability adequate to safely complete daily activities?: Yes Patient able to express need for assistance with ADLs?: Yes Independently performs ADLs?: Yes  Abuse/Neglect [facility]) Physical Abuse: Denies Verbal Abuse: Denies Sexual Abuse: Denies  Prior Inpatient Therapy Prior Inpatient Therapy: Yes Prior Therapy Dates: 1 1/2 years ago Prior Therapy Facilty/Provider(s): [facility] Reason for Treatment: opiate use  Prior Outpatient Therapy Prior Outpatient Therapy: Yes Prior Therapy Dates: 6 monts ago Prior Therapy Facilty/Provider(s): [facility] Reason for Treatment: opiate dependence  ADL Screening (condition at time of admission) Patient's cognitive ability adequate to safely complete daily activities?: Yes Patient able to express need for assistance with ADLs?: Yes Independently performs ADLs?: Yes Weakness of Legs: None Weakness of Arms/Hands: None  Home Assistive Devices/Equipment Home Assistive Devices/Equipment: None  Therapy Consults (therapy consults require a physician order) PT Evaluation Needed: No OT Evalulation Needed: No SLP Evaluation Needed: No Abuse/Neglect Assessment (Assessment to be complete while patient is alone) Physical Abuse: Denies Verbal Abuse: Denies Sexual Abuse: Denies   Consults Spiritual Care Consult Needed: No Social Work Consult Needed: No [employer]chant [employer] officer (For Healthcare) Advance Directive: Patient does not have advance directive Pre-existing out of facility DNR order (yellow form or pink MOST form): No Nutrition Screen Diet: Regular Unintentional weight loss greater than 10lbs within the last month: No Problems chewing or swallowing foods and/or liquids: No Home Tube Feeding or Total Parenteral Nutrition (TPN): No Patient appears severely malnourished: No Pregnant or Lactating: No  Additional Information 1:1 In Past 12 Months?: No CIRT Risk: No Elopement Risk: No Does patient have medical clearance?: No     Disposition:  Disposition Disposition of Patient: Inpatient treatment program  On Site Evaluation by:   Reviewed with Physician:     Wynona Luna 01/26/2012 10:36 AM

## 2012-01-26 NOTE — Progress Notes (Signed)
Psychoeducational Group Note  Date:  01/26/2012 Time:  1100  Group Topic/Focus:  Building Self Esteem:   The Focus of this group is helping patients become aware of the effects of self-esteem on their lives, the things they and others do that enhance or undermine their self-esteem, seeing the relationship between their level of self-esteem and the choices they make and learning ways to enhance self-esteem.  Participation Level:  Did Not Attend    Additional Comments:  Pt did not attend group because she was being admitted on the unit.   Sharyn Lull 01/26/2012, 2:17 PM

## 2012-01-26 NOTE — [facility] Suicide Risk Assessment (Signed)
Suicide Risk Assessment  Admission Assessment      Demographic factors:  See chart.  Current Mental Status:  Current Mental Status:  (denies all)  Patient seen and evaluated. Chart reviewed. Patient stated that her mood was "ok".  Self presneted to come off opioids.  Her affect was mood congruent and euthymic. She denied any current thoughts of self injurious behavior, suicidal ideation or homicidal ideation. There were no auditory or visual hallucinations, paranoia, delusional thought processes, or mania noted.  Thought process was linear and goal directed.  No psychomotor agitation or retardation was noted. Speech was normal rate, tone and volume. Eye contact was good. Judgment and insight are fair.  Patient has been up and engaged on the unit.  No acute safety concerns reported from team.    Loss Factors: Loss of significant relationship; mother died of OD  Historical Factors: Family history of mental illness or substance abuse  Risk Reduction Factors: motivated for Tx; open to residential Tx  CLINICAL FACTORS: Opioid Dependence & W/D; Cannabis Abuse; recent UTI, s/p Tx-currently asymptomatic   COGNITIVE FEATURES THAT CONTRIBUTE TO RISK: none.  SUICIDE RISK: Pt viewed as a chronic increased risk of harm to self in light of her past hx and risk factors.  No acute safety concerns on the unit.  Pt contracting for safety and in need of crisis stabilization & Tx.  PLAN OF CARE: Pt admitted for crisis stabilization & detox off opiates with standard clonidine taper and prn medications.  Please see orders.  Medications reviewed with pt and medication education provided.  Will continue q15 minute checks per unit protocol.  No clinical indication for one on one level of observation at this time.  Pt contracting for safety.  Mental health treatment, medication management and continued sobriety will mitigate against the increased risk of harm to self and/or others.  Discussed the importance of recovery  with pt, as well as, tools to move forward in a healthy & safe manner.  Pt agreeable with the plan.  Discussed with the team.   Lupe Carney 01/26/2012, 4:11 PM

## 2012-01-26 NOTE — Progress Notes (Signed)
D.  Pt. Lying in bed with eyes closed respirations even and unlabored.  A.  Pt. Encouraged to attend Karaoke. R.  Pt. Refused to attend group.  Reports "I don't feel good."  Pt. Monitored for safety.

## 2012-01-26 NOTE — Tx Team (Signed)
Initial Interdisciplinary Treatment Plan  PATIENT STRENGTHS: (choose at least two) Ability for insight Average or above average intelligence Capable of independent living Communication skills General fund of knowledge Motivation for treatment/growth Physical Health Supportive family/friends  PATIENT STRESSORS: Loss of parent (mother 2013)* Substance abuse   PROBLEM LIST: Problem List/Patient Goals Date to be addressed Date deferred Reason deferred Estimated date of resolution  Substance (Heroin use) 01-26-2012   Discharge  Grief (loss of mother 2013) 01-26-2012   Discharge                                             DISCHARGE CRITERIA:  Ability to meet basic life and health needs Adequate post-discharge living arrangements Improved stabilization in mood, thinking, and/or behavior Motivation to continue treatment in a less acute level of care Need for constant or close observation no longer present Withdrawal symptoms are absent or subacute and managed without 24-hour nursing intervention  PRELIMINARY DISCHARGE PLAN: Attend aftercare/continuing care group Attend 12-step recovery group Return to previous living arrangement  PATIENT/FAMIILY INVOLVEMENT: This treatment plan has been presented to and reviewed with the patient, Kara Walker.  The patient and family have been given the opportunity to ask questions and make suggestions.  Cranford Mon 01/26/2012, 3:19 PM

## 2012-01-26 NOTE — Progress Notes (Signed)
[facility] Group Notes:  (Counselor/Nursing/MHT/Case Management/Adjunct)  01/26/2012 3:46 PM  Type of Therapy:  Group Therapy  Participation Level:  None  Participation Quality: Somewhat  Attentive  Affect:  Flat  Cognitive:  Alert  Insight:  None  Engagement in Group:  None  Engagement in Therapy:  None  Summary of Progress/Problems: Patient admitted within the last few hours left her first group therapy session 15 minutes into group.    Clide Dales 01/26/2012, 3:46 PM
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
## 2012-01-26 NOTE — H&P (Signed)
Psychiatric Admission Assessment Adult  Patient Identification:  Kara Walker  Date of Evaluation:  01/26/2012  Chief Complaint:  OPIATE DEP  History of Present Illness: I am here because I am addicted to heroin. I have been using heroin x 6 months. I was introduced to it by my ex-boyfriend. I use heroin because it makes me feel better. I am in constant pain because of the sudden death of my mother last year. She died of drug overdose. She was using fentanyl patch. The one you place on your skin. It stopped her heart. I came here to get help so that I will be successful in getting off of drugs. I am using other pills too, such as Roxys, Percocet etc. I am very young. I have to do something with my life. I would want to have a life without drugs. I am not depressed and suicidal".  Mood Symptoms:  "I am disappointed in myself"  Depression Symptoms:  depressed mood,  (Hypo) Manic Symptoms:  None reported  Anxiety Symptoms:  Excessive Worry, about my drug use  Psychotic Symptoms:  Hallucinations: None  PTSD Symptoms: Had a traumatic exposure:  "My mother died of drug overdose just last year"  Past Psychiatric History: Diagnosis: Heroin abuse, PTSD  Hospitalizations: [facility]  Outpatient Care: "None"  Substance Abuse Care: "None"  Self-Mutilation: Denies  Suicidal Attempts: Denies thoughts and or attempts  Violent Behaviors: Denies   Past Medical History:   Past Medical History  Diagnosis Date  . UTI (urinary tract infection)      Allergies:  No Known Allergies  PTA Medications: Prescriptions prior to admission  Medication Sig Dispense Refill  . nitrofurantoin, macrocrystal-monohydrate, (MACROBID) 100 MG capsule Take 1 capsule (100 mg total) by mouth 2 (two) times daily.  10 capsule  0     Substance Abuse History in the last 12 months: Substance Age of 1st Use Last Use Amount Specific Type  Nicotine Denies use     Alcohol Denies use     Cannabis      Opiates 17 01/25/12   Heroin, roxys, hydrocodone  Cocaine Denies use     Methamphetamines Denies use     LSD Denies use     Ecstasy Denies use     Benzodiazepines Denies use     Caffeine      Inhalants      Others:                         Consequences of Substance Abuse: Medical Consequences:  Liver damage, possible death by overdose Legal Consequences:  arrests, jail time Family Consequences:  family discord  Social History: Current Place of Residence: [place]   Place of Birth:  [place]  Family Members: "My siblings and my dad"  Marital Status:  Single  Children:0  Sons:0  Daughters:0  Relationships:"I'm single"  Education:  HS Graduate  Educational Problems/Performance: None reported  Religious Beliefs/Practices:None reported  History of Abuse (Emotional/Phsycial/Sexual): denies  Occupational Experiences: English as a second language teacher History:  None.  Legal History: None reported  Hobbies/Interests: none reported  Family History:  No family history on file.  Mental Status Examination/Evaluation: Objective:  Appearance: Casual  Eye Contact::  Good  Speech:  Clear and Coherent  Volume:  Normal  Mood:  Euthymic  Affect:  Appropriate  Thought Process:  Coherent and Intact  Orientation:  Full  Thought Content:  Rumination  Suicidal Thoughts:  No  Homicidal Thoughts:  No  Memory:  Immediate;   Good Recent;   Good Remote;   Good  Judgement:  Fair  Insight:  Fair  Psychomotor Activity:  Normal  Concentration:  Good  Recall:  Good  Akathisia:  No  Handed:  Right  AIMS (if indicated):     Assets:  Desire for Improvement  Sleep:       Laboratory/X-Ray: None Psychological Evaluation(s)      Assessment:    AXIS I:  Heroin abuse AXIS II:  Deferred AXIS III:   Past Medical History  Diagnosis Date  . UTI (urinary tract infection)    AXIS IV:  other psychosocial or environmental problems AXIS V:  1-10 persistent dangerousness to self and others present  Treatment  Plan/Recommendations: Admit for safety and stabilizations. Start Clonidine protocol for opiate detoxification. Group counseling sessions and activities, AA/NA meetings. Continue current treatment plan.  Treatment Plan Summary: Daily contact with patient to assess and evaluate symptoms and progress in treatment Medication management Current Medications:  Current Facility-Administered Medications  Medication Dose Route Frequency Provider Last Rate Last Dose  . acetaminophen (TYLENOL) tablet 650 mg  650 mg Oral Q6H PRN Alyson Kuroski-Mazzei, DO      . alum & mag hydroxide-simeth (MAALOX/MYLANTA) 200-200-20 MG/5ML suspension 30 mL  30 mL Oral Q4H PRN Alyson Kuroski-Mazzei, DO      . cloNIDine (CATAPRES) tablet 0.1 mg  0.1 mg Oral QID Alyson Kuroski-Mazzei, DO   0.1 mg at 01/26/12 1158   Followed by  . cloNIDine (CATAPRES) tablet 0.1 mg  0.1 mg Oral BH-qamhs Alyson Kuroski-Mazzei, DO       Followed by  . cloNIDine (CATAPRES) tablet 0.1 mg  0.1 mg Oral QAC breakfast Alyson Kuroski-Mazzei, DO      . dicyclomine (BENTYL) tablet 20 mg  20 mg Oral Q6H PRN Alyson Kuroski-Mazzei, DO      . hydrOXYzine (ATARAX/VISTARIL) tablet 25 mg  25 mg Oral Q6H PRN Alyson Kuroski-Mazzei, DO      . loperamide (IMODIUM) capsule 2-4 mg  2-4 mg Oral PRN Alyson Kuroski-Mazzei, DO      . magnesium hydroxide (MILK OF MAGNESIA) suspension 30 mL  30 mL Oral Daily PRN Alyson Kuroski-Mazzei, DO      . methocarbamol (ROBAXIN) tablet 500 mg  500 mg Oral Q8H PRN Alyson Kuroski-Mazzei, DO      . naproxen (NAPROSYN) tablet 500 mg  500 mg Oral BID PRN Alyson Kuroski-Mazzei, DO   500 mg at 01/26/12 1156  . ondansetron (ZOFRAN-ODT) disintegrating tablet 4 mg  4 mg Oral Q6H PRN Alyson Kuroski-Mazzei, DO        Observation Level/Precautions:  Q 15 minutes checks for safety  Laboratory:  (+) Opiates, (+) THC per ED lab reports.  Psychotherapy: Group sessions   Medications:  See medication lists  Routine PRN Medications:  Yes    Consultations: None indicated at this time   Discharge Concerns:  Maintaining sobriety.  Other:     Sanjuana Kava 7/18/20135:18 PM

## 2012-01-27 DIAGNOSIS — F1120 Opioid dependence, uncomplicated: Principal | ICD-10-CM

## 2012-01-27 DIAGNOSIS — F4310 Post-traumatic stress disorder, unspecified: Secondary | ICD-10-CM

## 2012-01-27 MED ORDER — TRAZODONE HCL 50 MG PO TABS
50.0000 mg | ORAL_TABLET | Freq: Every day | ORAL | Status: DC
  Administered 2012-01-27 – 2012-01-29 (×3): 50 mg via ORAL
  Filled 2012-01-27 (×2): qty 1
  Filled 2012-01-27: qty 7
  Filled 2012-01-27: qty 1
  Filled 2012-01-27: qty 7
  Filled 2012-01-27 (×2): qty 1

## 2012-01-27 NOTE — Progress Notes (Signed)
Patient ID: Kara Walker, female   DOB: [birth year], 21 y.o.   MRN: [id number] She has been up and about and to group. Interacting with peers and staff.

## 2012-01-27 NOTE — Progress Notes (Signed)
[facility] MD Progress Note  01/27/2012 1:59 PM  S: "I don't want to be here. I want to go home. I feel weak and tired. I am not sleeping well at night. I need something to help me sleep"  Diagnosis:   Axis I: Post Traumatic Stress Disorder and Opioid dependence Axis II: Deferred Axis III:  Past Medical History  Diagnosis Date  . UTI (urinary tract infection)    Axis IV: other psychosocial or environmental problems Axis V: 51-60 moderate symptoms  ADL's:  Intact  Sleep: Fair  Appetite:  Good  Suicidal Ideation:  Plan:  No Intent:  No Means:  No Homicidal Ideation:  Plan:  No Intent:  no Means:  no  AEB (as evidenced by):  Mental Status Examination/Evaluation: Objective:  Appearance: Casual  Eye Contact::  Good  Speech:  Clear and Coherent  Volume:  Normal  Mood:  "I am very sad here"  Affect:  Flat and Tearful  Thought Process:  Coherent  Orientation:  Full  Thought Content:  Rumination  Suicidal Thoughts:  No  Homicidal Thoughts:  No  Memory:  Immediate;   Good Recent;   Good Remote;   Good  Judgement:  Poor  Insight:  Fair  Psychomotor Activity:  Normal  Concentration:  Fair  Recall:  Good  Akathisia:  No  Handed:  Right  AIMS (if indicated):     Assets:  Desire for Improvement  Sleep:  Number of Hours: 6.5    Vital Signs:Blood pressure 93/67, pulse 98, temperature 97.6 F (36.4 C), temperature source Oral, resp. rate 16, height 5' 3.5" (1.613 m), weight 56.7 kg (125 lb), last menstrual period 01/10/2012. Current Medications: Current Facility-Administered Medications  Medication Dose Route Frequency Provider Last Rate Last Dose  . acetaminophen (TYLENOL) tablet 650 mg  650 mg Oral Q6H PRN Alyson Kuroski-Mazzei, DO      . alum & mag hydroxide-simeth (MAALOX/MYLANTA) 200-200-20 MG/5ML suspension 30 mL  30 mL Oral Q4H PRN Alyson Kuroski-Mazzei, DO      . cloNIDine (CATAPRES) tablet 0.1 mg  0.1 mg Oral QID Alyson Kuroski-Mazzei, DO   0.1 mg at 01/27/12 0981     Followed by  . cloNIDine (CATAPRES) tablet 0.1 mg  0.1 mg Oral BH-qamhs Alyson Kuroski-Mazzei, DO       Followed by  . cloNIDine (CATAPRES) tablet 0.1 mg  0.1 mg Oral QAC breakfast Alyson Kuroski-Mazzei, DO      . dicyclomine (BENTYL) tablet 20 mg  20 mg Oral Q6H PRN Alyson Kuroski-Mazzei, DO      . hydrOXYzine (ATARAX/VISTARIL) tablet 25 mg  25 mg Oral Q6H PRN Alyson Kuroski-Mazzei, DO   25 mg at 01/27/12 0830  . loperamide (IMODIUM) capsule 2-4 mg  2-4 mg Oral PRN Alyson Kuroski-Mazzei, DO      . magnesium hydroxide (MILK OF MAGNESIA) suspension 30 mL  30 mL Oral Daily PRN Alyson Kuroski-Mazzei, DO      . methocarbamol (ROBAXIN) tablet 500 mg  500 mg Oral Q8H PRN Alyson Kuroski-Mazzei, DO      . naproxen (NAPROSYN) tablet 500 mg  500 mg Oral BID PRN Alyson Kuroski-Mazzei, DO   500 mg at 01/26/12 1156  . ondansetron (ZOFRAN-ODT) disintegrating tablet 4 mg  4 mg Oral Q6H PRN Alyson Kuroski-Mazzei, DO      . traZODone (DESYREL) tablet 50 mg  50 mg Oral QHS Sanjuana Kava, NP        Lab Results: No results found for this or any previous visit (  from the past 48 hour(s)).  Physical Findings: AIMS: Facial and Oral Movements Muscles of Facial Expression: None, normal Lips and Perioral Area: None, normal Jaw: None, normal Tongue: None, normal,Extremity Movements Upper (arms, wrists, hands, fingers): None, normal Lower (legs, knees, ankles, toes): None, normal, Trunk Movements Neck, shoulders, hips: None, normal, Overall Severity Severity of abnormal movements (highest score from questions above): None, normal Incapacitation due to abnormal movements: None, normal Patient's awareness of abnormal movements (rate only patient's report): No Awareness, Dental Status Current problems with teeth and/or dentures?: No Does patient usually wear dentures?: No  CIWA:    COWS:  COWS Total Score: 1   Treatment Plan Summary: Daily contact with patient to assess and evaluate symptoms and progress in  treatment Medication management  Plan: Start Trazodone 50 mg Q bedtime for sleep. Continue current treatment plan.  Armandina Stammer I 01/27/2012, 1:59 PM

## 2012-01-27 NOTE — [facility] Counselor (Signed)
Adult Comprehensive Assessment  Patient ID: Kara Walker, female   DOB: 05-18-1991, 21 y.o.   MRN: [id number]  Information Source: Information source: Patient  Current Stressors:  Educational / Learning stressors: NA Employment / Job issues: NA; student Family Relationships: Good; some strain due to drug use Surveyor, quantity / Lack of resources (include bankruptcy): NA Housing / Lack of housing: NA Physical health (include injuries & life threatening diseases): NA Social relationships: Not really; superficial relationships as pt tends to isolate Substance abuse: ongoing issues Bereavement / Loss: Mom died 2010-09-14  Living/Environment/Situation:  Living Arrangements: Parent Living conditions (as described by patient or guardian): good How long has patient lived in current situation?: New home for the last year What is atmosphere in current home: Supportive  Family History:  Marital status: Single Does patient have children?: No  Childhood History:  By whom was/is the patient raised?: Both parents Additional childhood history information: Mother died 09-14-2010, M had issues with drug use Description of patient's relationship with caregiver when they were a child: Good with both Patient's description of current relationship with people who raised him/her: M deceased, Good w F Does patient have siblings?: Yes Number of Siblings: 2  Description of patient's current relationship with siblings: good Did patient suffer any verbal/emotional/physical/sexual abuse as a child?: No Did patient suffer from severe childhood neglect?: No Has patient ever been sexually abused/assaulted/raped as an adolescent or adult?: No Was the patient ever a victim of a crime or a disaster?: No Witnessed domestic violence?: No Has patient been effected by domestic violence as an adult?: No  Education:  Highest grade of school patient has completed: 12th 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: Heroin 6-8 bags daily for 6 months; also using Opanas and Roxies on regular basis yet not daily  If attempted suicide, did drugs/alcohol play a role in this?: No Alcohol/Substance Abuse Treatment Hx: Past Tx, Inpatient;Past Tx, Outpatient If yes, describe treatment: [facility] 2011, CDIOP Past 6 months at [facility] Has alcohol/substance abuse ever caused legal problems?: No  Social Support System:   Conservation officer, nature Support System: Fair Development worker, community Support System: Father siblings friend Type of faith/religion: NA How does patient's faith help to cope with current illness?: NA  Leisure/Recreation:   Leisure and Hobbies: NA  Strengths/Needs:   What things does the patient do well?: Good Student In what areas does patient struggle / problems for patient: Responsibility, Death of Mother  Discharge Plan:   Does patient have access to transportation?: Yes Will patient be returning to same living situation after discharge?: Yes Currently receiving community mental health services: Yes (From Whom) [facility] CDIOP) Does patient have financial barriers related to discharge medications?: No  Summary/Recommendations:   Summary and Recommendations (to be completed by the evaluator): Patient is 21 YO caucasian female single student admitted with diagnosis of Adjustment Disorder NOS and Opiate Dependence.  Patient's mother who  also had issues with drug  use died in February of 2012.  Patient will benefit from detox, crisis stabilization, medication evaluation, group therapy and psych education in addition to case management for discharge planning.   Clide Dales. 01/27/2012

## 2012-01-27 NOTE — Progress Notes (Signed)
[facility] Group Notes:  (Counselor/Nursing/MHT/Case Management/Adjunct)  01/27/2012 3:08 PM  Type of Therapy:  Group Therapy at 1:15 to 2:30  Participation Level:  Did Not Attend more than 10 minutes of group  Clide Dales 01/27/2012, 3:08 PM

## 2012-01-27 NOTE — Progress Notes (Signed)
[facility] Group Notes:  (Counselor/Nursing/MHT/Case Management/Adjunct)  01/27/2012 2:05 PM  Type of Therapy:  Psychoeducational Skills  Participation Level:  Active  Participation Quality:  Appropriate, Attentive and Sharing  Affect:  Appropriate and Flat  Cognitive:  Alert and Appropriate  Insight:  Good  Engagement in Group:  Good  Modes of Intervention:  Activity  Summary of Progress/Problems:Pt participated in Pensions consultant. Pt got up and interacted with all the pts in group in order to get to know everyone better. Pt completed activity by having a discussion about how this activity can after discharge.     Dalia Heading 01/27/2012, 2:05 PM

## 2012-01-27 NOTE — Progress Notes (Signed)
Patient participated in PSA Part III while in bed due to cramps and chills.  Patient refusing consent to contact father. Clide Dales 01/27/2012 11:18 AM

## 2012-01-27 NOTE — Progress Notes (Signed)
Has been resting quietly tonight, eyes closed, resp reg, unlabored, no c/o's voiced.  Will continue to monitor.

## 2012-01-27 NOTE — Progress Notes (Signed)
Patient seen during d/c planning group.  She report admitting to the hospital to detox from opiods.  She reports using six to eight bags daily for the past six months.  Patient is interested in residential treatment.  Writer reviewed treatment options for patients without insurance.  She advised she was in [facility] a year ago and does not want to go back there.  She is interested in follow up with [facility].  Message left for Don at [facility] for a referral.  Patient reports she lives with her father and can return to the home.  Patient questioned the use of Methadone treatment.  Patient was informed it is a viable treatment option but only she can make a decision of what she will need to stay clean.

## 2012-01-27 NOTE — Progress Notes (Signed)
Psychoeducational Group Note  Date:  01/27/2012 Time:  1100  Group Topic/Focus:  Relapse Prevention Planning:   The focus of this group is to define relapse and discuss the need for planning to combat relapse.  Participation Level:  None  Participation Quality:  Appropriate  Affect:  Flat  Cognitive:  Alert  Insight:  pt was in group shortly   Engagement in Group:  None  Additional Comments:  Pt was only in group shortly before being pulled out and did not return to group.   Dalia Heading 01/27/2012, 2:09 PM

## 2012-01-28 MED ORDER — NICOTINE 21 MG/24HR TD PT24
21.0000 mg | MEDICATED_PATCH | Freq: Every day | TRANSDERMAL | Status: DC
  Administered 2012-01-28: 21 mg via TRANSDERMAL
  Filled 2012-01-28 (×3): qty 1

## 2012-01-28 NOTE — Progress Notes (Signed)
[facility] Group Notes:  (Counselor/Nursing/MHT/Case Management/Adjunct)  01/28/2012 10:03 PM  Type of Therapy:  Psychoeducational Skills  Participation Level:  Active  Participation Quality:  Appropriate  Affect:  Appropriate  Cognitive:  Appropriate  Insight:  Good  Engagement in Group:  Good  Engagement in Therapy:  Good  Modes of Intervention:  Support  Summary of Progress/Problems: Pt attended the 1315 Group and stayed for the full duration. Pt acknowledged needing to differentiate between positive and negative coping skills by pointing out that her previous sources of joy (Friday nights out) will need to be different going forward.   Christ Kick 01/28/2012, 10:03 PM

## 2012-01-28 NOTE — Progress Notes (Signed)
  Kara Walker is a 21 y.o. female [id number] 11-20-1990  01/26/2012 Principal Problem:  *Opioid dependence   Mental Status: Mood is better today -slept well last night. Denies SI/HI/AVH.    Subjective/Objective:  Detoxing without major symptoms. Hopes to [facility] 30 day program.  Filed Vitals:   01/28/12 0801  BP: 101/69  Pulse: 110  Temp:   Resp:     Lab Results:   BMET    Component Value Date/Time   NA 135 01/18/2012 1155   K 4.2 01/18/2012 1155   CL 100 01/18/2012 1155   CO2 28 01/18/2012 1155   GLUCOSE 102* 01/18/2012 1155   BUN 8 01/18/2012 1155   CREATININE 0.54 01/18/2012 1155   CALCIUM 9.8 01/18/2012 1155   GFRNONAA >90 01/18/2012 1155   GFRAA >90 01/18/2012 1155    Medications:  Scheduled:     . cloNIDine  0.1 mg Oral QID   Followed by  . cloNIDine  0.1 mg Oral BH-qamhs   Followed by  . cloNIDine  0.1 mg Oral QAC breakfast  . traZODone  50 mg Oral QHS     PRN Meds acetaminophen, alum & mag hydroxide-simeth, dicyclomine, hydrOXYzine, loperamide, magnesium hydroxide, methocarbamol, naproxen, ondansetron  Plan: continue current plan of care.  Kara Walker,MICKIE D. 01/28/2012

## 2012-01-28 NOTE — Progress Notes (Signed)
Psychoeducational Group Note  Date: 01/28/2012 Time:  1015  Group Topic/Focus:  Identifying Needs:   The focus of this group is to help patients identify their personal needs that have been historically problematic and identify healthy behaviors to address their needs.  Participation Level:  Active  Participation Quality:  Appropriate  Affect:  Appropriate  Cognitive:  Alert  Insight:  Good  Engagement in Group:  Good  Additional Comments  Pt has awareness and insight and she knows what she needs to do to turn her life around.   Dione Housekeeper

## 2012-01-28 NOTE — Progress Notes (Signed)
Pt. Is in bed resting  Quietly with her eyes closed.  No signs of distress or discomfort noted at this time.

## 2012-01-28 NOTE — Progress Notes (Signed)
Patient ID: Damyia Strider, female   DOB: 06/17/1991, 21 y.o.   MRN: [id number]  [facility] Group Notes:  (Counselor/Nursing/MHT/Case Management/Adjunct)  01/28/2012 1:15 PM  Type of Therapy:  Group Therapy, Dance/Movement Therapy   Participation Level:  Minimal  Participation Quality:  Appropriate  Affect:  Appropriate  Cognitive:  Appropriate  Insight:  None  Engagement in Group:  Limited  Engagement in Therapy:  None  Modes of Intervention:  Clarification, Problem-solving, Role-play, Socialization and Support  Summary of Progress/Problems:  Therapist asked group to define the term self sabotage.  Therapist asked group to identify self sabotaging behaviors in their lives.  Therapist asked group why is it important to recognize triggers.  Once triggers are identified; therapist asked group what are ways to develop healthy coping mechanisms.  Group discussed various motivations in their lives that help maintain their recovery process.  Pt. stated, "I'm just agitated  and ready to go home.  I don't understand why I have to stay here this long.  I really can't focus on the group discussion right now".     Rhunette Croft 01/28/2012. 4:55 PM

## 2012-01-28 NOTE — Progress Notes (Signed)
[name] is seen out in the milieu this morning...she is pleasant and cooperative. She interacts appropriately with her peers and is interested in her POC. She makes good eye contact.  A SHe completes her AM self inventory and on it she wrote she denied SI  In the past 24 hrs, she rated her hopelessness as an " 8 " and stated her DC plan was to " get into a treatment center". R Safety is maintaiend and POC incldues fostering therapeutic relationship already established PD RN [facility]

## 2012-01-28 NOTE — Progress Notes (Signed)
Met with pt 1:1 at start of shift. Affect spontaneous, brightens when engaged. Reports no signs and symptoms of withdrawal at this time. "I feel good. I feel encouraged for the first time in awhile." Pt attended evening group. Interacts appropriately in milieu. Supported pt. Educated pt about how the drugs affect her mood which she was able to acknowledge. "It feels good at the time but then I feel worse." No prn's requested or given. Denies any SI/HI. Lawrence Marseilles

## 2012-01-28 NOTE — Progress Notes (Signed)
Patient ID: Kara Walker, female   DOB: [birth year], 21 y.o.   MRN: [id number] Pt. attended and participated in aftercare planning group. Pt. verbally accepted information on suicide prevention, warning signs to look for with suicide and crisis line numbers to use. The pt. agreed to call crisis line numbers if having warning signs or having thoughts of suicide. Pt. listed their current anxiety and depression level as a 3 on a scale of 1 to 10 with 10 being the high.    Pt. indicated that she has and interest in a long term treatment facility possibly [facility].  Pt. inquired  about the procedure of leaving from [facility] straight to facility. Case manger responded by stating that it would be in the best interest of the pt. to go straight to treatment facility from [facility] due to high probability of relaspe.  Pt. accepted and NA meeting schedule.

## 2012-01-29 NOTE — Progress Notes (Signed)
D Pt is seen OOB UAL on the 300 hall today and she is  Pleasant and cooperative. She attends her groups, is engaged in her POC and is actively trying to understand her disease, her feelings and her related behaviors.      A She completed her self inventory and on it she wrote she denied SI in the past 24 hrs, she rated her depression and hopelessness " 1 / 1 " and states  Her DC plan is to " get into a treatment  Center".       R Safety is in place and POC incldues fostering therapeutic relationship PD RN [facility]

## 2012-01-29 NOTE — Progress Notes (Signed)
Psychoeducational Group Note  Date:  01/29/2012 Time:  1515  Group Topic/Focus:  Conflict Resolution:   The focus of this group is to discuss the conflict resolution process and how it may be used upon discharge.  Participation Level:  Active  Participation Quality:  Appropriate, Sharing and Supportive  Affect:  Appropriate  Cognitive:  Appropriate  Insight:  Good  Engagement in Group:  Good  Additional Comments:  none  Isabella Ida, Genia Plants 01/29/2012, 4:19 PM

## 2012-01-29 NOTE — Progress Notes (Signed)
[facility] Group Notes:  (Counselor/Nursing/MHT/Case Management/Adjunct)  01/28/2012 2100  Type of Therapy:  wrap up group  Participation Level:  Active  Participation Quality:  Appropriate and Attentive  Affect:  Appropriate  Cognitive:  Appropriate  Insight:  Good  Engagement in Group:  Good  Engagement in Therapy:  unknown to this Clinical research associate  Modes of Intervention:  Clarification, Education and Support  Summary of Progress/Problems:Pt reported that she had more energy today and slept less during the day.  Pt is grateful for her family support and hopes to go to a 30 day treatment after discharge.     Gyselle, Matthew 01/29/2012, 2:43 AM

## 2012-01-29 NOTE — Progress Notes (Signed)
Patient ID: Kara Walker, female   DOB: [birth year], 21 y.o.   MRN: [id number]   [facility] Group Notes:  (Counselor/Nursing/MHT/Case Management/Adjunct)  01/29/2012 1:15 PM  Type of Therapy:  Group Therapy, Dance/Movement Therapy   Participation Level:  Minimal  Participation Quality:  Appropriate  Affect:  Appropriate  Cognitive:  Appropriate  Insight:  Limited  Engagement in Group:  Limited  Engagement in Therapy:  Limited  Modes of Intervention:  Clarification, Problem-solving, Role-play, Socialization and Support  Summary of Progress/Problems:  Therapist and group members discussed motivation for recovery, healthy support systems, and how to ask for help when we need it. Therapist encouraged group members to share positive statements about one another and group members practiced repeating these positive affirmations. Therapist ended group by sharing the 12 promises and encouraged group members to make their own promises and positive statements. Pt. shared that her motivation for recovery is her family. Pt. did not share much during group but did offer support to other pts. by checking on them when they stepped out.     Cassidi Long 01/29/2012. 3:03 PM

## 2012-01-29 NOTE — Progress Notes (Signed)
  Britley Gashi is a 21 y.o. female [id number] May 07, 1991  01/26/2012 Principal Problem:  *Opioid dependence   Mental Status: Mood is bright and cheerful. Denies SI/HI/AVH.  Subjective/Objective: Feels really good withdrawls aren't as bad as she anticipated. Wants to be able to spend onre night at home before she goes to a 30 day program.Says her father will stay with her and help transport her there.No signs or symptoms of withdrawal.     Filed Vitals:   01/28/12 2116  BP: 115/75  Pulse: 100  Temp:   Resp:     Lab Results:   BMET    Component Value Date/Time   NA 135 01/18/2012 1155   K 4.2 01/18/2012 1155   CL 100 01/18/2012 1155   CO2 28 01/18/2012 1155   GLUCOSE 102* 01/18/2012 1155   BUN 8 01/18/2012 1155   CREATININE 0.54 01/18/2012 1155   CALCIUM 9.8 01/18/2012 1155   GFRNONAA >90 01/18/2012 1155   GFRAA >90 01/18/2012 1155    Medications:  Scheduled:     . cloNIDine  0.1 mg Oral BH-qamhs   Followed by  . cloNIDine  0.1 mg Oral QAC breakfast  . nicotine  21 mg Transdermal Q0600  . traZODone  50 mg Oral QHS     PRN Meds acetaminophen, alum & mag hydroxide-simeth, dicyclomine, hydrOXYzine, loperamide, magnesium hydroxide, methocarbamol, naproxen, ondansetron  Plan: continue current plan of care.   Leondra Cullin,MICKIE D. 01/29/2012

## 2012-01-29 NOTE — Progress Notes (Signed)
Patient ID: Kara Walker, female   DOB: 23-Mar-1991, 21 y.o.   MRN: [id number]  After care: Pt. attended and participated in aftercare planning group. Pt. verbally accepted information on suicide prevention, warning signs to look for with suicide and crisis line numbers to use. The pt. agreed to call crisis line numbers if having warning signs or having thoughts of suicide. Pt. listed their current anxiety level as 3 and their current depression level as 1. Pt. is anxious about aftercare plans.

## 2012-01-30 MED ORDER — TRAZODONE HCL 50 MG PO TABS: 50.0000 mg | ORAL_TABLET | Freq: Every day | ORAL | Status: DC

## 2012-01-30 NOTE — Progress Notes (Signed)
[facility] Case Management Discharge Plan:  Will you be returning to the same living situation after discharge: Yes,  home with father At discharge, do you have transportation home?:Yes,  family Do you have the ability to pay for your medications:Yes,  mental health  Interagency Information:     Release of information consent forms completed and in the chart;  Patient's signature needed at discharge.  Patient to Follow up at:  Follow-up Information    Follow up with [facility] with Charmian Muff on 02/01/2012. (Wed at 1:00)    Contact information:   [phone]      Follow up with [facility]. (Continue to call Roe Coombs to get scheduled to come in for a bed)    Contact information:   [address] [place]  [phone]         Patient denies SI/HI:   Yes,  yes    Aeronautical engineer and Suicide Prevention discussed:  Yes,  yes  Barrier to discharge identified:No.  Summary and Recommendations:   Ida Rogue 01/30/2012, 12:48 PM

## 2012-01-30 NOTE — Progress Notes (Signed)
Pt was discharged home today. She denied any S/I H/I or A/V hallucinations.  She was given f/u appointment, rx, sample medications, and hotline info booklet. She voiced understanding to all instructions provided.  She declined the need for smoking cessation materials.  

## 2012-01-30 NOTE — Treatment Plan (Signed)
Interdisciplinary Treatment Plan Update (Adult)  Date: 01/30/2012  Time Reviewed: 3:44 PM   Progress in Treatment: Attending groups: Yes Participating in groups: Yes Taking medication as prescribed: Yes Tolerating medication: Yes   Family/Significant othe contact made:   Patient understands diagnosis:  Yes Discussing patient identified problems/goals with staff:  Yes Medical problems stabilized or resolved:  Yes Denies suicidal/homicidal ideation: Yes  In tx team Issues/concerns per patient self-inventory:  None Other:  New problem(s) identified: N/A  Reason for Continuation of Hospitalization: Other; describe D/C today  Interventions implemented related to continuation of hospitalization:   Additional comments:  Estimated length of stay:  Discharge Plan: Return home, see below  New goal(s): N/A  Review of initial/current patient goals per problem list:   1.  Goal(s):Safely detox from opiates  Met:  Yes  Target date:7/22  As evidenced ZO:XWRUEA vitals, no withdrawal symptoms  2.  Goal (s):Stabilize mood  Met:  Yes  Target date:7/22  As evidenced VW:UJWJ denies depression and anxiety in tx team today  3.  Goal(s):Get into rehab from here  Met:  No  Target date:7/22  As evidenced XB:JYNW wanted to go to [facility]-we were not able to secure a bed for today or tomorrow  4.  Goal(s):Identify comprehensive sobriety plan  Met:  Yes  Target date:7/22  As evidenced GN:FAOZ will continue to call [facility] to get a date for screening, and attend [facility] until then, starting 7/24  Attendees: Patient:  Kara Walker 01/30/2012 3:44 PM  Family:     Physician:  Lupe Carney 01/30/2012 3:44 PM   Nursing:  Robbie Louis  01/30/2012 3:44 PM   Case Manager:  Richelle Ito, LCSW 01/30/2012 3:44 PM   Counselor:  Ronda Fairly, LCSWA 01/30/2012 3:44 PM   Other:     Other:     Other:     Other:      Scribe for Treatment Team:   Ida Rogue, 01/30/2012 3:44  PM

## 2012-01-30 NOTE — [facility] Suicide Risk Assessment (Signed)
Suicide Risk Assessment  Discharge Assessment      Demographic factors:  Adolescent or young adult;Caucasian  Current Mental Status Per Nursing Assessment: On Admission:   (denies all) At Discharge: Pt denied any SI/HI/thoughts of self harm or acute psychiatric issues in treatment team with clinical, nursing and medical team present.  Current Mental Status Per Physician: Patient seen and evaluated. Chart reviewed. Patient stated that her mood was "better".  Self presneted to come off opioids.  Her affect was mood congruent and euthymic. She denied any current thoughts of self injurious behavior, suicidal ideation or homicidal ideation. There were no auditory or visual hallucinations, paranoia, delusional thought processes, or mania noted.  Thought process was linear and goal directed.  No psychomotor agitation or retardation was noted. Speech was normal rate, tone and volume. Eye contact was good. Judgment and insight are fair.  Patient has been up and engaged on the unit.  No acute safety concerns reported from team.    Loss Factors: Loss of significant relationship; mother died of OD  Historical Factors: Family history of mental illness or substance abuse  Risk Reduction Factors: motivated for Tx; open to residential Tx-yet, not willing to stay inpt until bed is available  Discharge Diagnoses: Opioid Use Disorder; Cannabis Use Disorder   Cognitive Features That Contribute To Risk: none.  Suicide Risk: Pt viewed as a chronic increased risk of harm to self in light of her past hx and risk factors.  No acute safety concerns noted since on the unit.  Pt contracting for safety and stable for discharge.  Nevertheless, treatment team and staff from [facility] tried to get her to stay inpt until bed was available at [facility].  Plan Of Care/Follow-up recommendations: Pt seen and evaluated in treatment team. Chart reviewed.  Pt stable for and requesting discharge to home with father. Pt contracting  for safety and does not currently meet [facility] involuntary commitment criteria for continued hospitalization against her will.  Mental health treatment and continued sobriety will mitigate against the potential increased risk of harm to self and/or others.  Discussed the importance of recovery further with pt, as well as, tools to move forward in a healthy & safe manner.  Pt agreeable with the plan.  Discussed with the team.  Please see orders, follow up appointments per [facility]) and full discharge summary to be completed by physician extender.  Recommend follow up with NA.  Diet: Regular.  Activity: As tolerated.     Lupe Carney 01/30/2012, 2:02 PM

## 2012-01-30 NOTE — Progress Notes (Signed)
(  Late entry - system downtime) Pt continues to report positive mood and outlook. Smiling, pleasant, playing cards with peers. Is hoping for d/c tomorrow and would like to spend a night at home prior to beginning 28 day program. Discussed with pt risk of relapse but pt verbalizes father will be present and there to support. Also verbalizes consequences father has laid out and also states she feels "too good to use right now." Pt supported. COWS is a "1" and VSS. Med compliant. No SI/HI/AVH. Lawrence Marseilles

## 2012-01-30 NOTE — Discharge Summary (Signed)
Read and reviewed.  See SRA/Discharge.

## 2012-01-30 NOTE — Progress Notes (Signed)
[facility] Group Notes:  (Counselor/Nursing/MHT/Case Management/Adjunct)  01/30/2012 11:42 AM  Type of Therapy:  Group Therapy from 1:15 to 2:30  Participation Level:  Active  Participation Quality:  Appropriate, Attentive and Sharing  Affect:  Appropriate, Flat and Irritable  Cognitive:  Appropriate  Insight:  Limited  Engagement in Group:  Good  Engagement in Therapy:  Good  Modes of Intervention:  Clarification, Limit-setting and Orientation  Summary of Progress/Problems:  Group discussion focused on what patient's see as their own obstacles to recovery.  Patient shares belief that "my recovery is made worse as family recently lost someone close to [place] and now they all look to me to make sure I am okay. The pressure is difficult and I sometimes want to just run. Cache was also able to share how we should not judge as she has done things she thought were impossible for her to ever do.  Anusha challenged other members to simply be honest, who do you think you are fooling.?      Clide Dales 01/30/2012 5:33 PM

## 2012-01-30 NOTE — Discharge Summary (Signed)
Physician Discharge Summary Note  Patient:  Kara Walker is an 21 y.o., female MRN:  [id number] DOB:  May 09, 1991 Patient phone:  [phone] (home)  Patient address:   [address] [place] [address]   Date of Admission:  01/26/2012 Date of Discharge: 01/30/2012   Discharge Diagnoses: Principal Problem:  *Opioid dependence  Axis Diagnosis:  Opioid dependence Level of Care:  [facility]  Hospital Course:     Kara Walker is a 21 year old girl who was voluntarily admitted for detox from heroin. The patient was admitted for detox stating that she had been using heroin daily for the past 6 months, she also noted using Percocet and Roxicodone as well. The patient stated she began using after her mother died suddenly from a drug overdose. Her mother was using a fentanyl patch and her heart stopped.  Kara Walker was evaluated by the treatment team and started on a clonidine protocol. Her response to treatment was monitored by daily evaluations by clinical provider. Her response to detox was monitored by daily clinical opiate withdrawal scores. Kara Walker was also encouraged to participate in unit programming including groups AA and NA groups. Kara Walker was asked each day to complete a self inventory to monitor her mental and emotional status. After 72 hours she noted no further withdrawal symptoms, and was ready to be discharged home with her father. Kara Walker met with the treatment team and will be going to a 30 day residential treatment facility at [place]. While on the unit she was given information to use upon discharge to help her maintain long-term sobriety and reduce the risk of relapse.  On the day of discharge she was medically stable, reported no withdrawal symptoms, and was in full touch with reality.  She was discharged on trazodone 50 mg by mouth at at bedtime for sleep. She left [facility] with all personal belongings via private transportation in no apparent distress.  Consults:  None  Significant  Diagnostic Studies:  None  Discharge Vitals:   Blood pressure 116/78, pulse 102, temperature 97.8 F (36.6 C), temperature source Oral, resp. rate 16, height 5' 3.5" (1.613 m), weight 56.7 kg (125 lb), last menstrual period 01/10/2012..  Mental Status Exam: See Mental Status Examination and Suicide Risk Assessment completed by Attending Physician prior to discharge.  Discharge destination:  Home  Is patient on multiple antipsychotic therapies at discharge:  No  Has Patient had three or more failed trials of antipsychotic monotherapy by history: N/A Recommended Plan for Multiple Antipsychotic Therapies: N/A Discharge Orders    Future Orders Please Complete By Expires   Diet - low sodium heart healthy      Increase activity slowly      Discharge instructions      Comments:   Take all of your medications as prescribed.  Be sure to keep ALL follow up appointments as scheduled. This is to ensure getting your refills on time to avoid any interruption in your medication.  If you find that you can not keep your appointment, call the clinic and reschedule. Be sure to tell the nurse if you will need a refill before your appointment.     Medication List  As of 01/30/2012  2:14 PM   STOP taking these medications         nitrofurantoin (macrocrystal-monohydrate) 100 MG capsule         TAKE these medications      Indication    traZODone 50 MG tablet   Commonly known as: DESYREL   Take 1  tablet (50 mg total) by mouth at bedtime. For insomnia.    Indication: Trouble Sleeping           Follow-up Information    Follow up with [facility] with Charmian Muff on 02/01/2012. (Wed at 1:00)    Contact information:   [phone]      Follow up with [facility]. (Continue to call Roe Coombs to get scheduled to come in for a bed)    Contact information:   [address]  [place]  [phone]        Follow-up recommendations:   Activities: Resume typical activities Diet: Resume typical  diet Other: Follow up with outpatient provider and report any side effects to out patient prescriber.  Comments:  Take all your medications as prescribed by your mental healthcare provider. Report any adverse effects and or reactions from your medicines to your outpatient provider promptly. Patient is instructed and cautioned to not engage in alcohol and or illegal drug use while on prescription medicines. In the event of worsening symptoms, patient is instructed to call the crisis hotline, 911 and or go to the nearest ED for appropriate evaluation and treatment of symptoms.  Signed: Rona Ravens. Lianette Broussard PAC For Dr. Lupe Carney 01/30/2012 2:14 PM

## 2012-02-01 NOTE — Progress Notes (Signed)
Patient Discharge Instructions:  After Visit Summary (AVS):   Faxed to:  01/31/2012 Psychiatric Admission Assessment Note:   Faxed to:  01/31/2012 Suicide Risk Assessment - Discharge Assessment:   Faxed to:  01/31/2012 Faxed/Sent to the Next Level Care provider:  01/31/2012  Faxed to [facility] @ [phone]  Wandra Scot, 02/01/2012, 5:52 PM

## 2012-02-25 ENCOUNTER — Emergency Department (HOSPITAL_BASED_OUTPATIENT_CLINIC_OR_DEPARTMENT_OTHER)
Admission: EM | Admit: 2012-02-25 | Discharge: 2012-02-25 | Disposition: A | Discharge status: Home / Self Care | Payer: Self-pay | Attending: Emergency Medicine | Admitting: Emergency Medicine

## 2012-02-25 ENCOUNTER — Encounter (HOSPITAL_BASED_OUTPATIENT_CLINIC_OR_DEPARTMENT_OTHER): Payer: Self-pay | Admitting: Emergency Medicine

## 2012-02-25 DIAGNOSIS — N390 Urinary tract infection, site not specified: Secondary | ICD-10-CM | POA: Insufficient documentation

## 2012-02-25 DIAGNOSIS — F17200 Nicotine dependence, unspecified, uncomplicated: Secondary | ICD-10-CM | POA: Insufficient documentation

## 2012-02-25 LAB — URINE MICROSCOPIC-ADD ON

## 2012-02-25 LAB — URINALYSIS, ROUTINE W REFLEX MICROSCOPIC
Ketones, ur: NEGATIVE mg/dL
Nitrite: POSITIVE — AB
Protein, ur: NEGATIVE mg/dL
Urobilinogen, UA: 0.2 mg/dL (ref 0.0–1.0)

## 2012-02-25 LAB — PREGNANCY, URINE: Preg Test, Ur: NEGATIVE

## 2012-02-25 MED ORDER — CEPHALEXIN 250 MG PO CAPS
ORAL_CAPSULE | ORAL | Status: AC
  Filled 2012-02-25: qty 2

## 2012-02-25 MED ORDER — CEPHALEXIN 250 MG PO CAPS
500.0000 mg | ORAL_CAPSULE | Freq: Once | ORAL | Status: AC
  Administered 2012-02-25: 500 mg via ORAL

## 2012-02-25 MED ORDER — CEPHALEXIN 500 MG PO CAPS: 500.0000 mg | ORAL_CAPSULE | Freq: Four times a day (QID) | ORAL | Status: AC

## 2012-02-25 NOTE — ED Notes (Signed)
Pt having dysuria x 2 days.  No fever or back pain.

## 2012-02-25 NOTE — ED Provider Notes (Signed)
History     CSN: [id number]  Arrival date & time 02/25/12  1830   First MD Initiated Contact with Patient 02/25/12 1841      Chief Complaint  Patient presents with  . Dysuria    (Consider location/radiation/quality/duration/timing/severity/associated sxs/prior treatment) Patient is a 21 y.o. female presenting with dysuria. The history is provided by the patient.  Dysuria  This is a new problem. The current episode started 2 days ago. The problem occurs every urination. The problem has been gradually worsening. The quality of the pain is described as burning. There has been no fever. She is not sexually active. Associated symptoms include frequency. Pertinent negatives include no chills, no nausea, no vomiting, no discharge, no hematuria and no flank pain. She has tried nothing for the symptoms. Her past medical history is significant for recurrent UTIs.    Past Medical History  Diagnosis Date  . UTI (urinary tract infection)     Past Surgical History  Procedure Date  . No past surgeries     No family history on file.  History  Substance Use Topics  . Smoking status: Current Some Day Smoker    Types: Cigarettes    Last Attempt to Quit: 04/14/2011  . Smokeless tobacco: Not on file  . Alcohol Use: No    OB History    Grav Para Term Preterm Abortions TAB SAB Ect Mult Living                  Review of Systems  Constitutional: Negative for fever and chills.  Gastrointestinal: Negative for nausea, vomiting and abdominal pain.  Genitourinary: Positive for dysuria and frequency. Negative for hematuria, flank pain, vaginal bleeding, vaginal discharge, vaginal pain and pelvic pain.    Allergies  Review of patient's allergies indicates no known allergies.  Home Medications  No current outpatient prescriptions on file.  BP 104/70  Pulse 81  Temp 99.3 F (37.4 C)  Resp 18  Ht 5\' 3"  (1.6 m)  Wt 128 lb (58.06 kg)  BMI 22.67 kg/m2  SpO2 100%  LMP  02/13/2012  Physical Exam  Nursing note and vitals reviewed. Constitutional: She appears well-developed and well-nourished. No distress.  HENT:  Head: Normocephalic and atraumatic.  Mouth/Throat: Oropharynx is clear and moist.  Cardiovascular: Normal rate, regular rhythm and normal heart sounds.   Pulmonary/Chest: Effort normal and breath sounds normal.  Abdominal: Soft. Bowel sounds are normal. She exhibits no distension and no mass. There is no tenderness. There is no rebound, no guarding and no CVA tenderness.  Neurological: She is alert.  Skin: Skin is warm and dry. She is not diaphoretic.  Psychiatric: She has a normal mood and affect.    ED Course  Procedures (including critical care time)  Labs Reviewed  URINALYSIS, ROUTINE W REFLEX MICROSCOPIC - Abnormal; Notable for the following:    APPearance CLOUDY (*)     Nitrite POSITIVE (*)     Leukocytes, UA MODERATE (*)     All other components within normal limits  URINE MICROSCOPIC-ADD ON - Abnormal; Notable for the following:    Squamous Epithelial / LPF FEW (*)     Bacteria, UA MANY (*)     All other components within normal limits  PREGNANCY, URINE  URINE CULTURE   No results found.   No diagnosis found.    MDM  Patient presenting with dysuria.  UA consistent with UTI.  No CVA tenderness, fever, nausea, or vomiting.  Patient given prescription for antibiotic and  discharged.  Return precautions have been discussed with patient.          Pascal Lux Lupton, PA-C 02/25/12 1945

## 2012-02-26 NOTE — ED Provider Notes (Signed)
Medical screening examination/treatment/procedure(s) were performed by non-physician practitioner and as supervising physician I was immediately available for consultation/collaboration.   Maeby Vankleeck M Rhonin Trott, MD 02/26/12 1457 

## 2012-02-28 LAB — URINE CULTURE: Colony Count: >=100000

## 2012-02-29 NOTE — ED Notes (Signed)
+   urine  Patient treated appropriately -sensitive to same-chart appended per protocol MD.  

## 2012-03-02 ENCOUNTER — Encounter (HOSPITAL_COMMUNITY): Payer: Self-pay | Admitting: Psychology

## 2012-03-05 ENCOUNTER — Other Ambulatory Visit (HOSPITAL_COMMUNITY): Payer: Federal, State, Local not specified - Other | Attending: Psychiatry | Admitting: Psychology

## 2012-03-05 DIAGNOSIS — F1120 Opioid dependence, uncomplicated: Secondary | ICD-10-CM | POA: Insufficient documentation

## 2012-03-06 NOTE — Progress Notes (Signed)
    Daily Group Progress Note  Program: [facility]   Group Time: 1-2:30   Participation Level: Minimal  Behavioral Response: Sharing  Type of Therapy: Psycho-education Group  Topic: Loss and Grief: A guest facilitator was present for group today and introduced herself and provided a little bit of her history. Following the check-in, a psycho-educational piece was provided focusing on the role that grief and loss can play in recovery. Members discussed how their pain and grief has led to drinking and drugging. A discussion ensued about ways members might address their grief or, if necessary, distract one's self away from the pain for a while. Everyone in the group seemed to be dealing with some sort of loss and the topic was very pertinent to recovery.   Group Time: 2:45- 4pm  Participation Level: Minimal  Behavioral Response: Sharing  Type of Therapy: Process Group  Topic: Process: second half of group was spent in process. Members discussed current issues in their lives and stressors that are proving difficult to deal with. There were 2 new group members and they introduced themselves briefly. There was good disclosure and feedback among the group. Per their reports, all group members remained alcohol and drug-free over the weekend.   Summary: Patient reported maintaining sobriety over the weekend. She shared about the loss of her mother due to drug overdose two years ago and that the death continues to be difficult to talk about.The patient expressed concerns about parenting her 34 year old sister and she acknowledged being concerned that she cannot help her sister to grieve their mother's death in an appropriate and fulfilling manner. The group provided support and encouragement to continue with therapy and manage her own grief process so that she can be more present for her sister.  Family Program: Family present? No   Name of family member(s):   UDS collected: No Results:  AA/NA  attended?: No  Sponsor?: No   Ruby Logiudice, LCAS

## 2012-03-07 ENCOUNTER — Other Ambulatory Visit (HOSPITAL_COMMUNITY): Payer: Federal, State, Local not specified - Other | Admitting: Psychology

## 2012-03-07 DIAGNOSIS — F1920 Other psychoactive substance dependence, uncomplicated: Secondary | ICD-10-CM

## 2012-03-08 LAB — PRESCRIPTION ABUSE MONITORING 17P, URINE
Amphetamine/Meth: NEGATIVE ng/mL
Cannabinoid Scrn, Ur: NEGATIVE ng/mL
Carisoprodol, Urine: NEGATIVE ng/mL
Cocaine Metabolites: NEGATIVE ng/mL
Fentanyl, Ur: NEGATIVE ng/mL
MDMA URINE: NEGATIVE ng/mL
Opiate Screen, Urine: NEGATIVE ng/mL
Tapentadol, urine: NEGATIVE ng/mL
Tramadol Scrn, Ur: NEGATIVE ng/mL
Zolpidem, Urine: NEGATIVE ng/mL

## 2012-03-08 NOTE — Progress Notes (Signed)
    Daily Group Progress Note  Program: CD-IOP   Group Time: 1-230 pm  Participation Level: Minimal  Behavioral Response: Sharing  Type of Therapy: Activity Group  Topic: Stress, recognizing it in your body, and learning to relax. First part of group was spent in a guided progressive relaxation session. The lights were dimmed and members sat comfortably as I read a progressive relaxation exercise. After the exercise concluded, members were asked to share their experiences of the guided exercise. It was emphasized that for the group members, there will always be some sort of stressors in their lives and, in early recovery, they must be particularly aware of these stressful times. Their external circumstances may not change, but it is our intention that their responses and reactions to those instances will be dramatically different in early recovery. During the check-in, the group applauded 2 members who have recently achieved 30 days of sobriety.  Group Time: 2:45- 4pm  Participation Level: Active  Behavioral Response: Appropriate and Sharing  Type of Therapy: Process Group  Topic: Group Process; members shared about their struggles and issues in early recovery. There was good feedback about procrastination and job seeking. One member talked about her 'shame' and noted this topic had been discussed in the previous session and had proved helpful. One member apologized as the group session neared the end. He apologized for not having been very engaged and admitted he had been extremely depressed for the past 3-4 days. The member stated he had an appointment to meet with his psychiatrist tomorrow. I thanked him for this disclosure as he has shared very little of himself in sessions and been instructed repeatedly to open up to the group.  Summary:  The patient appeared sleepy this afternoon, but she admitted it was the relaxation exercise and she, like another member, had gone to bed late. In  process, she questioned the disease concept of addiction and it proved a good reminder of the importance of total abstinence in recovery. The patient talked about the progressive nature of her disease and how she wants to change and not go back to that misery of a daily habit. She reported she is doing well in her recovery.     Family Program: Family present? No   Name of family member(s):   UDS collected: Yes Results: negative  AA/NA attended?: NO   Sponsor?: No   Tanise Russman, LCAS

## 2012-03-09 ENCOUNTER — Encounter (HOSPITAL_COMMUNITY): Payer: Self-pay | Admitting: Psychology

## 2012-03-09 ENCOUNTER — Other Ambulatory Visit (HOSPITAL_COMMUNITY): Payer: Federal, State, Local not specified - Other

## 2012-03-14 ENCOUNTER — Other Ambulatory Visit (HOSPITAL_COMMUNITY): Payer: Federal, State, Local not specified - Other | Attending: Psychiatry | Admitting: Psychology

## 2012-03-14 DIAGNOSIS — F1920 Other psychoactive substance dependence, uncomplicated: Secondary | ICD-10-CM | POA: Insufficient documentation

## 2012-03-14 NOTE — Progress Notes (Signed)
Patient ID: Kara Walker, female   DOB: 1991-07-11, 21 y.o.   MRN: [id number] The patient participated in progressive muscle relaxation.  She asked to be excused from group after 20 minutes complaining of stomach upset and diarrhea. The patient returned briefly at end of first half of group and left again complaining of diarrhea. She will be excused from this session.

## 2012-03-15 LAB — PRESCRIPTION ABUSE MONITORING 17P, URINE
Cannabinoid Scrn, Ur: NEGATIVE ng/mL
Cocaine Metabolites: NEGATIVE ng/mL
Creatinine, Urine: 19.23 mg/dL — ABNORMAL LOW (ref >20.0)
Methadone Screen, Urine: NEGATIVE ng/mL
Tapentadol, urine: NEGATIVE ng/mL
Tramadol Scrn, Ur: NEGATIVE ng/mL

## 2012-03-15 LAB — ALCOHOL METABOLITE (ETG), URINE: Ethyl Glucuronide (EtG): NEGATIVE ng/mL

## 2012-03-16 ENCOUNTER — Other Ambulatory Visit (HOSPITAL_COMMUNITY): Payer: Federal, State, Local not specified - Other | Admitting: Psychology

## 2012-03-16 LAB — OPIATES/OPIOIDS (LC/MS-MS): Codeine Urine: NEGATIVE ng/mL

## 2012-03-16 NOTE — Progress Notes (Signed)
Patient ID: Kara Walker, female   DOB: May 19, 1991, 21 y.o.   MRN: [id number] Orientation to CD-IOP: the patient is a 21 yo single, caucasian, female seeking entry into the CD-IOP to address her opioid dependence. The patient lives in [place] with her father. She has been using opiates for 2+ years and was enrolled in this program approximately 9 months ago. She dropped out after continued drug use. At the time, the patient was snorting oxycontin and opana, but her addiction has progressed and she is now reportedly using heroin IV. She also has a long history of cannabis use and has taken benzodiazepines through the years. She had been diagnosed as suffering from depression and was prescribed Celexa, but the patient stopped taking it and insisted she does not want to be on any medications. The patient sought treatment and completed an opiate detox here at [facility] approximately 2 months ago. She was discharged and sought further treatment at [facility]. She was eventually enrolled in that residential treatment center, but was discharged within a week after issues with other patients. Last year she had 2 detox episodes at [facility], but did not want to return there because it was "filthy". The patient's mother (who was divorced from patient's father over 7 years ago and had remarried) overdosed approximately 3 years ago and this has proven to be an ongoing source of great pain and loss for this young woman. She has minimal support, few if any friends that don't use drugs, and very poor coping skills. While she was in the CD-IOP she had managed to complete her GED. The patient is familiar with the 12-step community, but has been resistant to committing to daily meetings. Congruent with the addiction treatment perspective, the patient is 21 yo chronologically, but much younger and immature from an emotional perspective. All documentation was reviewed and the orientation was completed accordingly. Today the patient reported she  is very serious about recovery and wants to change her life unlike her previous episode in this program where she admitted she had not been serious about her recovery. Her change of heart remains to be seen. The  patient will return on Monday, August 26th, and enter the CD-IOP.
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
## 2012-03-19 ENCOUNTER — Other Ambulatory Visit (HOSPITAL_COMMUNITY): Payer: Federal, State, Local not specified - Other

## 2012-03-21 ENCOUNTER — Other Ambulatory Visit (HOSPITAL_COMMUNITY): Payer: Federal, State, Local not specified - Other | Admitting: Psychology

## 2012-03-21 DIAGNOSIS — F1920 Other psychoactive substance dependence, uncomplicated: Secondary | ICD-10-CM

## 2012-03-21 NOTE — Progress Notes (Signed)
    Daily Group Progress Note  Program: CD-IOP   Group Time: 1-2:30 pm  Participation Level: Minimal  Behavioral Response: Minimizing  Type of Therapy: Process Group  Topic:Group Process: first part of group was spent in process. There was a new group member present and during this half of group, she shared about her drinking and desires to quit. Members shared their frustrations in early recovery with family, friends and circumstances. The group was reminded that they are responsible for the choices they make. One member was present for his second session and he shared a little about his opiate use and anger about being here. He admitted his parents are making him come and he is also angry because he is not allowed to take his Adderall while in this program. His fellow group members shared their experiences, specifically about how their disease progressed and that they had recognized they couldn't do it themselves. There was good feedback and disclosure among members.   Group Time: 2:45-4pm  Participation Level: Minimal  Behavioral Response: Rationalizing  Type of Therapy: Activity Group  Topic: Activity: in second part of group members were asked to identify 2 people that they admire and to identify 2-3 qualities that they possess for which they are admired. After being provided with a few minutes to consider this assignment, members were asked to share about their answers. Every member shared that they admired a family member or friend. They also identified the characteristics or traits that they admired about them. As the activity progress, I pointed out that these are traits and qualities that each group member values. This exercise invites participants to recognize traits and qualities that they admire and value. This activity aids members in identifying the values they consider important and is used to stimulate reflection and direction towards living out their values.   Summary:The  patient reported she did not attend a meeting since the last group session despite having relapsed on Monday. I challenged her about why she isn't going to meetings considering she wasn't able to remain sober for over 14 days? She had plenty of explanations, but is not working or going to school. In the second half of group, the patient identified her aunt and me as people she admires. She noted that her aunt, (her mother's sister) is very active and loyal despite her recent diagnosis of MS. The patient also identified this Clinical research associate, me, as another source of inspiration. She stated that I was understanding, forgiving and noted that I had accepted her back into the program despite her previous enrollment that had been a dismal failure. The patient displays naivet and an immaturity that is congruent with her age and when she began using mind-altering chemicals. She consistently minimizes and denies the effects of her addiction on her daily life.    Family Program: Family present? No   Name of family member(s):   UDS collected: No Results:  AA/NA attended?: No  Sponsor?: No   Kara Walker, LCAS

## 2012-03-23 ENCOUNTER — Other Ambulatory Visit (HOSPITAL_COMMUNITY): Payer: Federal, State, Local not specified - Other

## 2012-03-23 NOTE — Progress Notes (Signed)
    Daily Group Progress Note  Program: CD-IOP   Group Time: 1-2:30 pm  Participation Level: Minimal  Behavioral Response: Sharing, Rationalizing, Resistant and Agitated  Type of Therapy: Psycho-education Group  Topic: Self-Esteem: How Negative Core Beliefs Develop and are Maintained. A presentation was provided examining the development of one's self-esteem and how negative core beliefs can develop. Handouts were provided to group members. Self-esteem is formed in childhood and is influenced by many different experiences one has in her/his world. The formation of negative core beliefs and subsequent development of rules and behaviors to protect one from the activation of those beliefs was charted on the board. Members shared about their early experiences and the beliefs about themselves that developed as results of those experiences. The patterns of behaviors that many group members have followed based on those core beliefs were highlighted and made perfect sense to those reporting them. The session proved enlightening to many. Friday's group session will focus on how one can begin to change those core beliefs.   Group Time: 2:45-4pm  Participation Level: Active  Behavioral Response: Sharing  Type of Therapy: Process Group  Topic: Group Process: second half of group was spent in process. Two members admitted they had relapsed with one member explaining she had lied about it all week and had felt very guilty keeping the truth from the group. The other member seemed very discouraged and questioned whether she would ever stop using. Her disclosures included childhood experiences that served to form her negative core beliefs about self-worth and value. There was good disclosure among group members with very affirming feedback to those struggling.   Summary: the patient checked-in with a new sobriety date. She admitted she had been using. She became resistant and frustrated as I questioned the  events leading up to her relapse. The patient became teary as group members described how they have dealt with their struggles in addiction and a number of them noted they could not have stopped using on their own. The patient suggested that perhaps she should go somewhere because she can't stop either. The patient became less defensive as the session progressed and shared experiences in her childhood. She shared that her mother, who was addicted, had left her and her younger sister many times to go out and get high. At age 27, it was natural that this young woman would have blamed herself for her mother's departure. Rhyleigh realized that she had some very deep dark core beliefs based largely on her childhood and mother's addiction. The group encouraged the patient to surrender and go into detox and inpatient. One woman talked about how nice it was just to get away for awhile and focus on herself. The patient received good feedback and support and appeared ready to consider a change.    Family Program: Family present? No   Name of family member(s):   UDS collected: No Results:patient admitted it would be positive for heroin  AA/NA attended?: No  Sponsor?: No   Versie Fleener, LCAS

## 2012-03-26 ENCOUNTER — Other Ambulatory Visit (HOSPITAL_COMMUNITY): Payer: Federal, State, Local not specified - Other

## 2012-03-28 ENCOUNTER — Other Ambulatory Visit (HOSPITAL_COMMUNITY): Payer: Federal, State, Local not specified - Other

## 2012-03-30 ENCOUNTER — Other Ambulatory Visit (HOSPITAL_COMMUNITY): Payer: Federal, State, Local not specified - Other

## 2012-04-02 ENCOUNTER — Other Ambulatory Visit (HOSPITAL_COMMUNITY): Payer: Federal, State, Local not specified - Other

## 2012-04-04 ENCOUNTER — Other Ambulatory Visit (HOSPITAL_COMMUNITY): Payer: Federal, State, Local not specified - Other

## 2012-04-06 ENCOUNTER — Other Ambulatory Visit (HOSPITAL_COMMUNITY): Payer: Federal, State, Local not specified - Other

## 2012-04-09 ENCOUNTER — Other Ambulatory Visit (HOSPITAL_COMMUNITY): Payer: Federal, State, Local not specified - Other

## 2012-04-11 ENCOUNTER — Other Ambulatory Visit (HOSPITAL_COMMUNITY): Payer: Federal, State, Local not specified - Other | Attending: Psychiatry

## 2012-04-13 ENCOUNTER — Other Ambulatory Visit (HOSPITAL_COMMUNITY): Payer: Federal, State, Local not specified - Other

## 2012-04-16 ENCOUNTER — Other Ambulatory Visit (HOSPITAL_COMMUNITY): Payer: Federal, State, Local not specified - Other

## 2012-04-18 ENCOUNTER — Other Ambulatory Visit (HOSPITAL_COMMUNITY): Payer: Federal, State, Local not specified - Other

## 2012-04-20 ENCOUNTER — Other Ambulatory Visit (HOSPITAL_COMMUNITY): Payer: Federal, State, Local not specified - Other

## 2012-04-23 ENCOUNTER — Other Ambulatory Visit (HOSPITAL_COMMUNITY): Payer: Federal, State, Local not specified - Other

## 2012-04-25 ENCOUNTER — Other Ambulatory Visit (HOSPITAL_COMMUNITY): Payer: Federal, State, Local not specified - Other

## 2012-04-27 ENCOUNTER — Other Ambulatory Visit (HOSPITAL_COMMUNITY): Payer: Federal, State, Local not specified - Other

## 2012-06-09 ENCOUNTER — Emergency Department (HOSPITAL_COMMUNITY)
Admission: EM | Admit: 2012-06-09 | Discharge: 2012-06-09 | Disposition: A | Discharge status: Home / Self Care | Payer: Self-pay | Source: Home / Self Care | Attending: Emergency Medicine | Admitting: Emergency Medicine

## 2012-06-09 ENCOUNTER — Other Ambulatory Visit (HOSPITAL_COMMUNITY)
Admission: RE | Admit: 2012-06-09 | Discharge: 2012-06-09 | Disposition: A | Discharge status: Home / Self Care | Payer: Self-pay | Source: Ambulatory Visit | Attending: Emergency Medicine | Admitting: Emergency Medicine

## 2012-06-09 ENCOUNTER — Encounter (HOSPITAL_COMMUNITY): Payer: Self-pay | Admitting: *Deleted

## 2012-06-09 DIAGNOSIS — N3000 Acute cystitis without hematuria: Secondary | ICD-10-CM

## 2012-06-09 DIAGNOSIS — N760 Acute vaginitis: Secondary | ICD-10-CM | POA: Insufficient documentation

## 2012-06-09 DIAGNOSIS — Z113 Encounter for screening for infections with a predominantly sexual mode of transmission: Secondary | ICD-10-CM | POA: Insufficient documentation

## 2012-06-09 DIAGNOSIS — B9689 Other specified bacterial agents as the cause of diseases classified elsewhere: Secondary | ICD-10-CM

## 2012-06-09 LAB — POCT URINALYSIS DIP (DEVICE)
Glucose, UA: 100 mg/dL — AB
Hgb urine dipstick: NEGATIVE
Ketones, ur: NEGATIVE mg/dL
Specific Gravity, Urine: >=1.03 (ref 1.005–1.030)

## 2012-06-09 MED ORDER — METRONIDAZOLE 500 MG PO TABS: 500.0000 mg | ORAL_TABLET | Freq: Two times a day (BID) | ORAL | Status: DC

## 2012-06-09 MED ORDER — CEPHALEXIN 500 MG PO CAPS: 500.0000 mg | ORAL_CAPSULE | Freq: Three times a day (TID) | ORAL | Status: DC

## 2012-06-09 NOTE — ED Notes (Signed)
Pt    Reports  Symptoms  Of   Frequency  And  Burning on  Urination     With  Foul  Vaginal  Odor    Symptoms  X  1  Week    Walks  Upright with a  Steady  Fluid  Gait

## 2012-06-09 NOTE — ED Provider Notes (Signed)
Chief Complaint  Patient presents with  . Urinary Frequency    History of Present Illness:   The patient is a 21 year old female who has had a one half week history of dysuria, red urine, malodorous urine, frequency, and urgency. She has had urinary tract infections before, last one was 2 months ago. She also notes some vaginal odor and itching. She denies any pelvic or lower back pain. Her last menstrual period was November 16. She has had no sexual activity in the last month. She denies fever, chills, nausea, vomiting, or back pain.  Review of Systems:  Other than noted above, the patient denies any of the following symptoms: General:  No fevers, chills, sweats, aches, or fatigue. GI:  No abdominal pain, back pain, nausea, vomiting, diarrhea, or constipation. GU:  No dysuria, frequency, urgency, hematuria, or incontinence. GYN:  No discharge, itching, vulvar pain or lesions, pelvic pain, or abnormal vaginal bleeding.  PMFSH:  Past medical history, family history, social history, meds, and allergies were reviewed.  Physical Exam:   Vital signs:  BP 115/65  Pulse 78  Temp 98.2 F (36.8 C) (Oral)  Resp 16  SpO2 99%  LMP 05/26/2012 Gen:  Alert, oriented, in no distress. Lungs:  Clear to auscultation, no wheezes, rales or rhonchi. Heart:  Regular rhythm, no gallop or murmer. Abdomen:  Flat and soft. There was slight suprapubic pain to palpation.  No guarding, or rebound.  No hepato-splenomegaly or mass.  Bowel sounds were normally active.  No hernia. Pelvic exam:  Normal external genitalia, she has a moderate amount of white, frothy, malodorous discharge. No cervical motion pain, uterus is mid position, normal in size and shape, no adnexal masses or tenderness. Back:  No CVA tenderness.  Skin:  Clear, warm and dry.  Labs:   Results for orders placed during the hospital encounter of 06/09/12  POCT URINALYSIS DIP (DEVICE)      Component Value Range   Glucose, UA 100 (*) NEGATIVE mg/dL     Bilirubin Urine SMALL (*) NEGATIVE   Ketones, ur NEGATIVE  NEGATIVE mg/dL   Specific Gravity, Urine >=1.030  1.005 - 1.030   Hgb urine dipstick NEGATIVE  NEGATIVE   pH 5.5  5.0 - 8.0   Protein, ur NEGATIVE  NEGATIVE mg/dL   Urobilinogen, UA 0.2  0.0 - 1.0 mg/dL   Nitrite NEGATIVE  NEGATIVE   Leukocytes, UA NEGATIVE  NEGATIVE  POCT PREGNANCY, URINE      Component Value Range   Preg Test, Ur NEGATIVE  NEGATIVE     Other Labs Obtained at Urgent Care Center:  A urine culture was obtained.  Results are pending at this time and we will call about any positive results.  Assessment: The primary encounter diagnosis was Acute cystitis. A diagnosis of Bacterial vaginosis was also pertinent to this visit.   Plan:   1.  The following meds were prescribed:   New Prescriptions   CEPHALEXIN (KEFLEX) 500 MG CAPSULE    Take 1 capsule (500 mg total) by mouth 3 (three) times daily.   METRONIDAZOLE (FLAGYL) 500 MG TABLET    Take 1 tablet (500 mg total) by mouth 2 (two) times daily.   2.  The patient was instructed in symptomatic care and handouts were given. 3.  The patient was told to return if becoming worse in any way, if no better in 3 or 4 days, and given some red flag symptoms that would indicate earlier return. 4.  The patient was told  to avoid intercourse for 10 days, get extra fluids, and return for a follow up with her primary care doctor at the completion of treatment for a repeat UA and culture.     Reuben Likes, MD 06/09/12 2031

## 2012-06-10 LAB — URINE CULTURE

## 2012-06-11 NOTE — ED Notes (Signed)
Final report not available

## 2012-06-12 NOTE — ED Notes (Signed)
GC/Chlamydia neg., Gardnerella pos., Urine culture: Insignificant growth.  Pt. adequately treated with Flagyl. Kara Walker 06/12/2012

## 2012-07-13 ENCOUNTER — Ambulatory Visit (HOSPITAL_COMMUNITY)
Admission: RE | Admit: 2012-07-13 | Discharge: 2012-07-13 | Disposition: A | Discharge status: Home / Self Care | Payer: Self-pay | Attending: Psychiatry | Admitting: Psychiatry

## 2012-07-13 ENCOUNTER — Encounter (HOSPITAL_COMMUNITY): Payer: Self-pay | Admitting: *Deleted

## 2012-07-13 DIAGNOSIS — F39 Unspecified mood [affective] disorder: Secondary | ICD-10-CM | POA: Insufficient documentation

## 2012-07-13 DIAGNOSIS — F1120 Opioid dependence, uncomplicated: Secondary | ICD-10-CM | POA: Insufficient documentation

## 2012-07-13 NOTE — BH Assessment (Signed)
Assessment Note   Kara Walker is a 22 y.o. single white female.  She presents accompanied by her boyfriend, Everardo Pacific [phone]), who remained for assessment at the request of the pt.  Pt reports that she is seeking help with addiction to heroin and narcotic pain medications.  She decided to present today because, "life's getting out of control."  When asked to clarify, she reports that she has been unemployed for the past two months due in part to addiction, that she is having financial problems, and that she is alienating her boyfriend, her family, and her father with whom she lives.  Her father has not, however, threatened to evict her.  Pt reports that she started using narcotic pain medications at 22 y/o, but that her addiction started "getting out of control" at 22 y/o, around which time she started using heroin.  She denies using anything else, but acknowledges that at 22 y/o she was arrested for possession of cocaine and drug paraphernalia.  She does not currently have any outstanding legal charges.  Pt reports that for the past 7 or 8 months she has been using 6 bags of IV heroin on a daily basis when it is available.  When it is not, she uses narcotic pain medications instead, typically consisting of 60 - 90 mg of Roxycontin, which she crushes and snorts.  Her most recent use was yesterday (07/12/2012).  She reports that she was sober for about 4 months, but relapsed about 2 months ago when an associate of hers relapsed.  She reports a number of withdrawal symptoms at this time including yawning, watering eyes, nausea, cramping, agitation, weakness, loss of appetite, chills and fever, and diaphoresis.  Pt denies current or past SI, self injurious behavior, HI, physical aggression toward others, or AH/VH.  She exhibits no delusional thought.  She denies depressed mood, but endorses a many symptoms of depression as indicated in the "risk to self" assessment below.  She endorses problems with  anxiety, and has a history of panic attacks as a teenager, but not since she started using opiates.  Pt reports having a number of social supports, including her father, her boyfriend, and members of her extended family.  Her mother also had problems with addiction, and she died of a recreational overdose on 08/22/2010.  Her brother and an uncle also have a history of addiction problems.  Pt was hospitalized for opioid detox in 01/2012.  She went through two courses of treatment in the CD-IOP program at [facility], in 2012, and again in 2013.  She attended 12-Step meetings during her 4 month period of sobriety.  Today she is requesting admission to [facility] for opiate detox.  Axis I: Opioid Dependence 304.00; Mood Disorder NOS 296.90 Axis II: Deferred 799.9 Axis III:  Past Medical History  Diagnosis Date  . UTI (urinary tract infection)    Axis IV: economic problems, occupational problems, problems related to social environment and problems with primary support group Axis V: GAF = 45  Past Medical History:  Past Medical History  Diagnosis Date  . UTI (urinary tract infection)     Past Surgical History  Procedure Date  . No past surgeries     Family History: No family history on file.  Social History:  reports that she has been smoking Cigarettes.  She does not have any smokeless tobacco history on file. She reports that she uses illicit drugs (Heroin and Oxycodone) about 7 times per week. She reports that she does not  drink alcohol.  Additional Social History:  Alcohol / Drug Use Pain Medications: Roxycontin or other narcotic pain medications, as available Prescriptions: Denies Over the Counter: Denies Longest period of sobriety (when/how long): 4 months, ending 2 months ago. Negative Consequences of Use: Financial;Legal;Personal relationships;Work / Mining engineer #1 Name of Substance 1: Heroin (uses IV) 1 - Age of First Use: 22 y/o 1 - Amount (size/oz): 6 bags 1 - Frequency: daily 1 -  Duration: 7 - 8 months 1 - Last Use / Amount: 07/12/2012 Substance #2 Name of Substance 2: Roxycontin (snorts when heroin is not available) 2 - Age of First Use: 22 y/o 2 - Amount (size/oz): 60 - 90 mg 2 - Frequency: daily (when heroin is not available) 2 - Duration: 7 - 8 months 2 - Last Use / Amount: unspecified  CIWA:   COWS: Clinical Opiate Withdrawal Scale (COWS) Sweating: Subjective report of chills or flushing Restlessness: Reports difficulty sitting still, but is able to do so Pupil Size: Pupils pinned or normal size for room light Bone or Joint Aches: Mild diffuse discomfort Runny Nose or Tearing: Nasal stuffiness or unusually moist eyes GI Upset: nausea or loose stool Tremor: No tremor Yawning: Yawning once or twice during assessment Anxiety or Irritability: Patient reports increasing irritability or anxiousness Gooseflesh Skin: Skin is smooth  Allergies: No Known Allergies  Home Medications:  (Not in a hospital admission)  OB/GYN Status:  No LMP recorded.  General Assessment Data Location of Assessment: [facility] Assessment Services Living Arrangements: Parent (Father) Can pt return to current living arrangement?: Yes Admission Status: Voluntary Is patient capable of signing voluntary admission?: Yes Transfer from: Home Referral Source: Self/Family/Friend  Education Status Is patient currently in school?: No  Risk to self Suicidal Ideation: No Suicidal Intent: No Is patient at risk for suicide?: No Suicidal Plan?: No Access to Means: No What has been your use of drugs/alcohol within the last 12 months?: Heroin, narcotic pain medications. Previous Attempts/Gestures: No How many times?: 0  Other Self Harm Risks: Denies any history of SI; identifies family as deterrent. Triggers for Past Attempts: Other (Comment) (Not applicable) Intentional Self Injurious Behavior: None Family Suicide History: No (Mother died of recreational OD on 09/15/2010.) Recent stressful  life event(s): Job Loss;Financial Problems;Conflict (Comment) (Conflict with family members, but not evicted.) Persecutory voices/beliefs?: No Depression: Yes (Denies, but endorses many symptoms.) Depression Symptoms: Isolating;Fatigue;Guilt;Loss of interest in usual pleasures;Feeling worthless/self pity;Feeling angry/irritable (Hypersomnia) Substance abuse history and/or treatment for substance abuse?: Yes (Heroin, narcotic pain medications.) Suicide prevention information given to non-admitted patients: Yes  Risk to Others Homicidal Ideation: No Thoughts of Harm to Others: No Current Homicidal Intent: No Current Homicidal Plan: No Access to Homicidal Means: No Identified Victim: None History of harm to others?: No Assessment of Violence: None Noted Violent Behavior Description: Calm/cooperative Does patient have access to weapons?: No (Denies having access to firearms) Criminal Charges Pending?: No (Hx of paraphernalia/cocaine possession charge @ 22 y/o.) Does patient have a court date: No  Psychosis Hallucinations: None noted Delusions: None noted  Mental Status Report Appear/Hygiene: Other (Comment) (Casual, well groomed) Eye Contact: Good Motor Activity: Restlessness (Mild leg motion) Speech: Other (Comment) (Unremarkable) Level of Consciousness: Alert Mood: Anxious (Mildly anxious) Affect: Appropriate to circumstance Anxiety Level: Minimal (Hx of panic attacks in teens when not on opiates.) Thought Processes: Coherent;Relevant Judgement: Unimpaired Orientation: Person;Place;Time;Situation Obsessive Compulsive Thoughts/Behaviors: None  Cognitive Functioning Concentration: Decreased (Mild) Memory: Recent Intact;Remote Intact IQ: Average Insight: Good Impulse Control: Fair Appetite: Poor (  x 2 months) Weight Loss: 0  Weight Gain: 0  Sleep: Increased (10 - 12 hrs/night x 6 months.) Total Hours of Sleep: 11  Vegetative Symptoms: None  ADLScreening [facility] Assessment  Services) Patient's cognitive ability adequate to safely complete daily activities?: Yes Patient able to express need for assistance with ADLs?: Yes Independently performs ADLs?: Yes (appropriate for developmental age)  Abuse/Neglect [facility]) Physical Abuse: Denies Verbal Abuse: Denies Sexual Abuse: Denies  Prior Inpatient Therapy Prior Inpatient Therapy: Yes Prior Therapy Dates: 01/2012 Prior Therapy Facilty/Provider(s): [facility] Reason for Treatment: Opioid Detox  Prior Outpatient Therapy Prior Outpatient Therapy: Yes Prior Therapy Dates: 2012, 2013 Prior Therapy Facilty/Provider(s): CD-IOP @ [facility] Reason for Treatment: Opioid addiction rehab  ADL Screening (condition at time of admission) Patient's cognitive ability adequate to safely complete daily activities?: Yes Patient able to express need for assistance with ADLs?: Yes Independently performs ADLs?: Yes (appropriate for developmental age) Weakness of Legs: None Weakness of Arms/Hands: None  Home Assistive Devices/Equipment Home Assistive Devices/Equipment: None    Abuse/Neglect Assessment (Assessment to be complete while patient is alone) Physical Abuse: Denies Verbal Abuse: Denies Sexual Abuse: Denies Exploitation of patient/patient's resources: Denies Self-Neglect: Denies Values / Beliefs Cultural Requests During Hospitalization: Other (comment) (Pt has participated in 12-step meetings.)   Advance Directives (For Healthcare) Advance Directive: Patient does not have advance directive;Patient would not like information Pre-existing out of facility DNR order (yellow form or pink MOST form): No Nutrition Screen- [facility] Adult/[facility]/AP Have you recently lost weight without trying?: No Have you been eating poorly because of a decreased appetite?: Yes Malnutrition Screening Tool Score: 1   Additional Information 1:1 In Past 12 Months?: No CIRT Risk: No Elopement Risk: No Does patient have medical clearance?: No      Disposition:  Disposition Disposition of Patient: Referred to Patient referred to: [facility];Other (Comment) ([facility]) After consulting with Shuvon Rankin, who also performed MSE, it was determined that pt does not meet criteria for admission to [facility], but that she would benefit from substance abuse treatment.  She was given written referrals to [facility], [facility], and [facility] for inpatient/residential treatment, and to [facility] for outpatient treatment.  Pt departed from [facility] at 18:32.  On Site Evaluation by:   Reviewed with Physician:  Assunta Found, FNP   Raphael Gibney 07/13/2012 6:45 PM

## 2012-07-13 NOTE — H&P (Signed)
Behavioral Health Medical Screening Exam  Kara Walker is an 22 y.o. female.  Review of Systems  Constitutional: Negative.   HENT: Negative.   Eyes: Negative.   Respiratory: Negative.   Cardiovascular: Negative.   Gastrointestinal: Positive for nausea and constipation.       Patient states that she has been constipated related to the drugs.  States that she has also had pain in lower abdomen . For past week.  And is also late for menstrual cycle  Genitourinary: Negative.   Musculoskeletal: Negative.   Skin: Negative.   Neurological: Negative.   Endo/Heme/Allergies: Negative.   Psychiatric/Behavioral: Positive for depression ([name] state just sadness at time but nothing major) and substance abuse (herion, pain pills.  Started at the age of 25.  Last use yesterday.). Negative for suicidal ideas, hallucinations and memory loss. The patient is not nervous/anxious and does not have insomnia.     Physical Exam  Constitutional: She is oriented to person, place, and time. She appears well-developed and well-nourished.  HENT:  Head: Normocephalic.  Right Ear: External ear normal.  Left Ear: External ear normal.  Eyes: Pupils are equal, round, and reactive to light. Right eye exhibits no discharge. Left eye exhibits no discharge.  Neck: Normal range of motion. Neck supple.  Cardiovascular: Normal rate, regular rhythm, normal heart sounds and intact distal pulses.   Respiratory: Effort normal and breath sounds normal.  GI: Soft. Bowel sounds are normal. She exhibits no distension. There is no tenderness.  Musculoskeletal: Normal range of motion.  Neurological: She is alert and oriented to person, place, and time. She has normal reflexes.  Skin: Skin is warm and dry.  Psychiatric: She has a normal mood and affect. Her speech is normal and behavior is normal. Judgment and thought content normal.  Patient denies SI, HI, and HA.  Patient states that she does not have and abnormal level of  depression or anxiety that keeps her from performing daily tasks and functioning.  Patient states that she only want to detox from heroin and pain killer.    Discussed with patient that for admission for detox from opiates there would need to be an accompanying depression, anxiety, SI, HI, HA in which she continues to denies all of the above that facility did not do just opiate detox.  Patient states that there are other places that she could seek help; that she was just discharged from [facility] 06/25/2012 and that she had been admitted there 3 time and coming here was just an option because she was embarrassed to go back to [facility].  Patient states that she really does want to quit.  Discussed change her friends and getting a support group that would help and stand by her.  Patient boyfriend is drug free and states that he is going to do all he can to help states he has been trying to get her to make new friends that does not do drugs.    Also discussed with patient taking a pregnancy test and follow up with the Health Department and the importance of stopping drug use during pregnancy and afterwards.    There were no vitals taken for this visit.  Recommendations:  Based on my evaluation the patient does not appear to have an emergency medical condition. Resources given and discussed with patient.   Rankin, Shuvon 07/13/2012, 6:08 PM

## 2012-07-24 ENCOUNTER — Encounter (HOSPITAL_COMMUNITY): Payer: Self-pay | Admitting: *Deleted

## 2012-07-24 ENCOUNTER — Emergency Department (HOSPITAL_COMMUNITY)
Admission: EM | Admit: 2012-07-24 | Discharge: 2012-07-25 | Discharge status: Against Medical Advice | Payer: Self-pay | Attending: Emergency Medicine | Admitting: Emergency Medicine

## 2012-07-24 DIAGNOSIS — F17200 Nicotine dependence, unspecified, uncomplicated: Secondary | ICD-10-CM | POA: Insufficient documentation

## 2012-07-24 DIAGNOSIS — F1110 Opioid abuse, uncomplicated: Secondary | ICD-10-CM | POA: Insufficient documentation

## 2012-07-24 DIAGNOSIS — Z87440 Personal history of urinary (tract) infections: Secondary | ICD-10-CM | POA: Insufficient documentation

## 2012-07-24 DIAGNOSIS — Z3201 Encounter for pregnancy test, result positive: Secondary | ICD-10-CM | POA: Insufficient documentation

## 2012-07-24 LAB — COMPREHENSIVE METABOLIC PANEL
ALT: 9 U/L (ref 0–35)
Calcium: 9.6 mg/dL (ref 8.4–10.5)
Creatinine, Ser: 0.66 mg/dL (ref 0.50–1.10)
GFR calc Af Amer: >90 mL/min (ref >90)
Glucose, Bld: 87 mg/dL (ref 70–99)
Sodium: 138 mEq/L (ref 135–145)
Total Protein: 7.4 g/dL (ref 6.0–8.3)

## 2012-07-24 LAB — CBC WITH DIFFERENTIAL/PLATELET
Eosinophils Absolute: 0.1 10*3/uL (ref 0.0–0.7)
Eosinophils Relative: 2 % (ref 0–5)
Lymphs Abs: 2.6 10*3/uL (ref 0.7–4.0)
MCH: 30.1 pg (ref 26.0–34.0)
MCV: 87.1 fL (ref 78.0–100.0)
Monocytes Absolute: 0.4 10*3/uL (ref 0.1–1.0)
Platelets: 260 10*3/uL (ref 150–400)
RBC: 4.42 MIL/uL (ref 3.87–5.11)
RDW: 12.9 % (ref 11.5–15.5)

## 2012-07-24 LAB — ACETAMINOPHEN LEVEL: Acetaminophen (Tylenol), Serum: <15 ug/mL (ref 10–30)

## 2012-07-24 LAB — RAPID URINE DRUG SCREEN, HOSP PERFORMED
Amphetamines: NOT DETECTED
Benzodiazepines: NOT DETECTED
Cocaine: POSITIVE — AB
Opiates: POSITIVE — AB

## 2012-07-24 MED ORDER — IBUPROFEN 200 MG PO TABS: 600.0000 mg | ORAL_TABLET | Freq: Three times a day (TID) | ORAL | Status: DC | PRN

## 2012-07-24 MED ORDER — ONDANSETRON HCL 8 MG PO TABS
4.0000 mg | ORAL_TABLET | Freq: Three times a day (TID) | ORAL | Status: DC | PRN
Start: 2012-07-24 — End: 2012-07-25

## 2012-07-24 MED ORDER — ACETAMINOPHEN 325 MG PO TABS: 650.0000 mg | ORAL_TABLET | ORAL | Status: DC | PRN

## 2012-07-24 MED ORDER — LORAZEPAM 1 MG PO TABS
1.0000 mg | ORAL_TABLET | Freq: Three times a day (TID) | ORAL | Status: DC | PRN
  Administered 2012-07-25: 1 mg via ORAL
  Filled 2012-07-24: qty 1

## 2012-07-24 NOTE — ED Notes (Signed)
Patient states last used heroin 4 bag snorting one day ago and alternates IV and snorting for almost everyday for one year.  Denies SI and HI. Went to a facility to detox sent to ED for detox.  States has support from her father and wants to stop using drugs,  Denies alcohol, smoking, or other drug use.

## 2012-07-24 NOTE — ED Notes (Signed)
Pt was sent here to detox from IV heroin because her last use was yesterday and [facility] would not take her until she detoxes.  No thoughts of SI/HI

## 2012-07-24 NOTE — ED Provider Notes (Signed)
History   This chart was scribed for non-physician practitioner working with Kara Booze, MD by Sofie Rower, ED Scribe. This patient was seen in room TR09C/TR09C and the patient's care was started at 5:25PM.  CSN: [id number]  Arrival date & time 07/24/12  1516   First MD Initiated Contact with Patient 07/24/12 1725      Chief Complaint  Patient presents with  . Medical Clearance    (Consider location/radiation/quality/duration/timing/severity/associated sxs/prior treatment) Patient is a 22 y.o. female presenting with general illness. The history is provided by the patient. No language interpreter was used.  Illness  The current episode started today. The onset was sudden. The problem occurs continuously. The problem has been gradually worsening. The problem is moderate. Nothing relieves the symptoms. Nothing aggravates the symptoms. She has received no recent medical care.    Kara Walker is a 22 y.o. female , with a hx of abortion (performed yesterday, 07/23/12), who presents to the Emergency Department requesting detox from IV heroin, onset today (07/24/12). The pt reports she visited [facility] for residential treatment, however, she was deferred because she has not detox at present time. The pt informs she desires detox from IV Heroin, the last time she injected was yesterday (07/23/12). The pt has been using for the past year.  The pt is a current some day smoker.     Past Medical History  Diagnosis Date  . UTI (urinary tract infection)     Past Surgical History  Procedure Date  . No past surgeries     No family history on file.  History  Substance Use Topics  . Smoking status: Current Some Day Smoker    Types: Cigarettes    Last Attempt to Quit: 04/14/2011  . Smokeless tobacco: Not on file  . Alcohol Use: No    OB History    Grav Para Term Preterm Abortions TAB SAB Ect Mult Living                  Review of Systems  All other systems reviewed and are  negative.    Allergies  Review of patient's allergies indicates no known allergies.  Home Medications   Current Outpatient Rx  Name  Route  Sig  Dispense  Refill  . IBUPROFEN 200 MG PO TABS   Oral   Take 800 mg by mouth every 6 (six) hours as needed. For pain           BP 103/65  Pulse 73  Temp 98.9 F (37.2 C) (Oral)  Resp 18  SpO2 98%  LMP 07/22/2012  Physical Exam  Nursing note and vitals reviewed. Constitutional: She is oriented to person, place, and time. She appears well-developed and well-nourished.  HENT:  Head: Atraumatic.  Right Ear: Tympanic membrane and external ear normal.  Left Ear: Tympanic membrane and external ear normal.  Nose: Nose normal.  Mouth/Throat: Oropharynx is clear and moist. No oropharyngeal exudate.  Cardiovascular: Normal rate, regular rhythm and normal heart sounds.   Pulmonary/Chest: Effort normal and breath sounds normal.  Abdominal: Soft. Bowel sounds are normal.  Musculoskeletal: Normal range of motion. She exhibits no tenderness.       Injection tracks detected at left [facility] space.   Neurological: She is alert and oriented to person, place, and time.  Skin: Skin is warm and dry.  Psychiatric: She has a normal mood and affect. Her behavior is normal.    ED Course  Procedures (including critical care time)  DIAGNOSTIC STUDIES: Oxygen  Saturation is 98% on room air, normal by my interpretation.    COORDINATION OF CARE:  5:38 PM- Treatment plan concerning evaluation by ACT team discussed with patient. Pt agrees with treatment.      Labs Reviewed  COMPREHENSIVE METABOLIC PANEL - Abnormal; Notable for the following:    Total Bilirubin 0.2 (*)     All other components within normal limits  URINE RAPID DRUG SCREEN (HOSP PERFORMED) - Abnormal; Notable for the following:    Opiates POSITIVE (*)     Cocaine POSITIVE (*)     Tetrahydrocannabinol POSITIVE (*)     All other components within normal limits  SALICYLATE LEVEL -  Abnormal; Notable for the following:    Salicylate Lvl <2.0 (*)     All other components within normal limits  POCT PREGNANCY, URINE - Abnormal; Notable for the following:    Preg Test, Ur POSITIVE (*)     All other components within normal limits  ETHANOL  CBC WITH DIFFERENTIAL  ACETAMINOPHEN LEVEL   No results found.   No diagnosis found.  Patient requesting detox for heroin use.  Current use for the past three months, last use yesterday.  Abortion 5 days ago at [facility] hospital.  Patient attempted to enter [facility] today, but they would not take as unable to manage acute detox.  No SI/HI.  MDM        I personally performed the services described in this documentation, which was scribed in my presence. The recorded information has been reviewed and is accurate.    Jimmye Norman, NP 07/25/12 [phone]

## 2012-07-24 NOTE — ED Notes (Signed)
Patient placed in paper scrubs belongings placed at nurse's station and wanded by security.

## 2012-07-24 NOTE — BH Assessment (Addendum)
Assessment Note   Kara Walker is an 22 y.o. female.  Patient comes to [facility] seeking detox from heroin.  She was trying to get into the [facility] tx program but she has to be detoxed first.  Patient is currently using between 6-8 bags a day for the past 3 months.  Patient reports last use as yesterday, 01/13.  Patient also had used cocaine and marijuana on Saturday, 01/11 but reports that this was a "one time use" and she does not normally use cocaine or marijuana.  Patient is denying any SI, HI, A/V hallucinations.  She does endorse some depression.  Currently she is mildly restless but is otherwise cooperative.  Patient has poor recent memory as she could not remember the date of the abortion she had recently.  She says it was not yesterday but a few days ago.  Patient was declined at [facility] and [facility] due to medical acuity.  Patient is being referred to [facility] for placement consideration. [facility]) called back and informed clinician that patient was declined.  Declined due to [facility] not doing just opiate detox any longer since it is not a life threatening situation.  This clinician talked to patient about doing outpatient detox through [facility] or other provider.  Patient said that she would contact [facility] in the AM and let them know she was going to do detox by herself.  Clinician encouraged her to go ahead and choose a provider from the list that she was provided.  Patient will be discharged in the AM.  Dr. Preston [facility]) informed. 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 UTI (urinary tract infection)    Axis IV: economic problems, occupational problems and other psychosocial or environmental problems Axis V: 31-40 impairment in reality testing  Past Medical History:  Past Medical History  Diagnosis Date  . UTI (urinary tract infection)     Past Surgical History  Procedure Date  . No past  surgeries   . Induced abortion     Family History: No family history on file.  Social History:  reports that she has been smoking Cigarettes.  She does not have any smokeless tobacco history on file. She reports that she uses illicit drugs (Heroin, Oxycodone, and IV) about 7 times per week. She reports that she does not drink alcohol.  Additional Social History:  Alcohol / Drug Use Pain Medications: Pt reports injecting heroin daily Prescriptions: None Over the Counter: N/A Longest period of sobriety (when/how long): Pt reports today that 30 days is her longest period of being sober Negative Consequences of Use: Financial;Personal relationships Withdrawal Symptoms: Agitation;Irritability;Cramps;Diarrhea;Fever / Chills;Nausea / Vomiting;Tingling;Tremors Substance #1 Name of Substance 1: Heroin 1 - Age of First Use: 22 years of age 6 - Amount (size/oz): 6-8 bags daily 1 - Frequency: Daily use 1 - Duration: Last three months at that rate 1 - Last Use / Amount: 01/13, used 4 bags Substance #2 Name of Substance 2: Roxycontin (snorts when heroin is not available) 2 - Age of First Use: 22 yrs old 2 - Amount (size/oz): 60-90 mg 2 - Frequency: Daily when heroin is not available 2 - Duration: Last 7-8 months 2 - Last Use / Amount: Could not recall  CIWA: CIWA-Ar BP: 103/65 mmHg Pulse Rate: 73  COWS: Clinical Opiate Withdrawal Scale (COWS) Resting Pulse Rate: Pulse Rate 80 or below Sweating: No report of chills or  flushing Restlessness: Reports difficulty sitting still, but is able to do so Pupil Size: Pupils possibly larger than normal for room light Bone or Joint Aches: Mild diffuse discomfort Runny Nose or Tearing: Nasal stuffiness or unusually moist eyes GI Upset: nausea or loose stool Tremor: No tremor Yawning: No yawning Anxiety or Irritability: Patient reports increasing irritability or anxiousness Gooseflesh Skin: Piloerection of skin can be felt or hairs standing up on  arms COWS Total Score: 10   Allergies: No Known Allergies  Home Medications:  (Not in a hospital admission)  OB/GYN Status:  Patient's last menstrual period was 07/22/2012.  General Assessment Data Location of Assessment: [facility] ED Living Arrangements: Parent (Lives with father) Can pt return to current living arrangement?: Yes Admission Status: Voluntary Is patient capable of signing voluntary admission?: Yes Transfer from: Acute Hospital Referral Source: Self/Family/Friend     Risk to self Suicidal Ideation: No Suicidal Intent: No Is patient at risk for suicide?: No Suicidal Plan?: No Access to Means: No What has been your use of drugs/alcohol within the last 12 months?: Heroin use Previous Attempts/Gestures: No How many times?: 0  Other Self Harm Risks: None reported Triggers for Past Attempts: None known Intentional Self Injurious Behavior: None Family Suicide History: No Recent stressful life event(s): Job Loss;Loss (Comment) (Mother died a year ago.) Persecutory voices/beliefs?: No Depression: Yes Depression Symptoms: Despondent;Loss of interest in usual pleasures;Feeling worthless/self pity;Isolating Substance abuse history and/or treatment for substance abuse?: Yes Suicide prevention information given to non-admitted patients: Not applicable  Risk to Others Homicidal Ideation: No Thoughts of Harm to Others: No Current Homicidal Intent: No Current Homicidal Plan: No Access to Homicidal Means: No Identified Victim: No one History of harm to others?: No Assessment of Violence: None Noted Violent Behavior Description: Pt calm and cooperative Does patient have access to weapons?: No Criminal Charges Pending?: No Does patient have a court date: No  Psychosis Hallucinations: None noted Delusions: None noted  Mental Status Report Appear/Hygiene:  (Casual) Eye Contact: Good Motor Activity: Restlessness;Freedom of movement Speech: Logical/coherent Level of  Consciousness: Alert Mood: Anxious Affect: Anxious;Appropriate to circumstance Anxiety Level: Minimal Thought Processes: Coherent;Relevant Judgement: Unimpaired Orientation: Person;Place;Time;Situation Obsessive Compulsive Thoughts/Behaviors: None  Cognitive Functioning Concentration: Decreased Memory: Recent Impaired;Remote Intact IQ: Average Insight: Good Impulse Control: Poor Appetite: Good Weight Loss: 0  Weight Gain: 0  Sleep: No Change Total Hours of Sleep:  (8-10 hours per night) Vegetative Symptoms: None  ADLScreening [facility] Assessment Services) Patient's cognitive ability adequate to safely complete daily activities?: Yes Patient able to express need for assistance with ADLs?: Yes Independently performs ADLs?: Yes (appropriate for developmental age)  Abuse/Neglect [facility]) Physical Abuse: Denies Verbal Abuse: Denies Sexual Abuse: Denies  Prior Inpatient Therapy Prior Inpatient Therapy: Yes Prior Therapy Dates: 01/2012 & in 2012-2013 Prior Therapy Facilty/Provider(s): [facility] and [facility] Reason for Treatment: Opioid Detox  Prior Outpatient Therapy Prior Outpatient Therapy: Yes Prior Therapy Dates: 2012, 2013 Prior Therapy Facilty/Provider(s): [facility] @ [facility] Reason for Treatment: Opioid addiction rehab  ADL Screening (condition at time of admission) Patient's cognitive ability adequate to safely complete daily activities?: Yes Patient able to express need for assistance with ADLs?: Yes Independently performs ADLs?: Yes (appropriate for developmental age) Weakness of Legs: None Weakness of Arms/Hands: None  Home Assistive Devices/Equipment Home Assistive Devices/Equipment: None    Abuse/Neglect Assessment (Assessment to be complete while patient is alone) Physical Abuse: Denies Verbal Abuse: Denies Sexual Abuse: Denies Exploitation of patient/patient's resources: Denies Self-Neglect: Denies Values / Beliefs Cultural Requests During  Hospitalization: None Spiritual  Requests During Hospitalization: None   Advance Directives (For Healthcare) Advance Directive: Patient does not have advance directive;Patient would not like information    Additional Information 1:1 In Past 12 Months?: No CIRT Risk: No Elopement Risk: No Does patient have medical clearance?: Yes     Disposition:  Disposition Disposition of Patient: Inpatient treatment program;Referred to Type of inpatient treatment program: Adult Patient referred to:  [facility] referral)  On Site Evaluation by:   Reviewed with Physician:  Dr. Karlene Einstein, Berna Spare Ray 07/24/2012 9:01 PM

## 2012-07-25 ENCOUNTER — Encounter (HOSPITAL_COMMUNITY): Payer: Self-pay | Admitting: *Deleted

## 2012-07-25 NOTE — ED Notes (Signed)
Pt belonging returned.  Duffle bag, purse, money, and 1 patient belonging bag.  Pt is currently waiting for ride.

## 2012-07-25 NOTE — ED Provider Notes (Signed)
Medical screening examination/treatment/procedure(s) were performed by non-physician practitioner and as supervising physician I was immediately available for consultation/collaboration.   [name], MD 07/25/12 [phone]

## 2012-07-25 NOTE — Discharge Instructions (Signed)
Opiate Dependence Followup at the outpatient resources given. Stop using heroin. Return to ED if you develop new or worsening symptoms. The above names are all different names used for opiates. Opiates are any medication made from the poppy plant. It is a medication which produces a calming, sleepy effect. Because achieving this effect requires more and more of this drug to get the same result, opiates become addictive. A family history of addiction or an addictive personality increases the risk. When drug use is interfering with normal living activities, it has become abuse. This includes problems with family, friends, and your job. Psychological dependence has developed when your mind tells you that the drug is needed. This emotional dependence is the craving for the "high" that some drugs cause. Emotional addicts always want this high instead of the way they are feeling when not using the drug. This is difficult to overcome. This is usually followed by physical dependence that has developed when continuing increases of drug are required to get the same feeling or "high". This is known as addiction or chemical dependency.  SIGNS OF CHEMICAL DEPENDENCY  Friends and family tell you there is a problem.  Fighting when using drugs.  Mood swings and insomnia.  Forgetfulness.  Not remembering what you do while using (blackouts).  Feeling sick from using drugs but you continue using.  Lie about use or amounts of drugs (chemicals) used.  Need chemicals to get you going.  Suffer in work International aid/development worker or school because of drug use.  Need drugs to relate to people or feel comfortable in social situations.  Use drugs to forget problems.  Difficulty with attention.  Neglecting obligations. If you answered "yes" to any or some of the above signs of chemical dependency, you may have a problem. The longer the use of drugs continues, the greater the problems will become. Do not experiment with drugs.    SIGNS AND SYMPTOMS OF PHYSICAL DEPENDENCE  Sudden stopping of the narcotic is uncomfortable when tolerance has developed. Physical problems will develop. This is called withdrawal.  How bad the withdrawal is varies from person to person. Some of the smaller problems are:  Tremors in the hands or shakes and jitters.  A fast heart rate and rapid breathing.  An increase in temperature.  Anxiety and panic attacks with bad dreams.  Muscle aches and pains. You may have more serious problems. These can include:  Feeling sick to your stomach or throwing up.  Dehydration develops if you cannot keep fluids down.  Tremors and chills or fever with sweating and anxiety.  Hallucinations and cravings.  Body aching with restlessness and insomnia.  Seizures or convulsions. These problems can last for months. These uncomfortable feeling can cause you to use drugs again just to feel better. OTHER HEALTH RISKS OF NARCOTICS USE INCLUDE:  The increased possibility of getting AIDS, hepatitis, other infectious or sexually transmitted diseases.  Unplanned pregnancy and having a baby born addicted to narcotics. You then put your baby through painful withdrawal symptoms including: shaking, jerking, and crying in pain. Many babies die. Other babies have lifelong disabilities and learning problems. TREATMENT  Effective treatment and management of narcotic addiction requires a multi-faceted, team approach that includes:  Medications to minimize the symptoms of narcotic withdrawal.  Medications to reduce the need for continued narcotic use.  Medical management of unrelated medical problems.  Pain management.  Social services.  Psychological treatment.  Behavioral therapies. Stopping your dependence is hard but may save your life. If  you continue using drugs, the only possible outcome is loss of self respect and esteem, violence, and eventually prison or death. To stop abuse, you must first realize  you have a problem. You control your behavior. Once you realize this, commit to quitting. Addiction is a disease. You need medical help to get well. Your caregiver can counsel you or refer you for counseling. The best way to do this is to seek out an organization for help. These include Alcoholics Anonymous, Narcotics Anonymous, or the [employer] on Alcoholism and Drug Dependence. HOW TO STAY CLEAN WHEN YOU HAVE QUIT USING  Develop healthy activities.  Form friendships with those who do not use drugs.  Stay away from all drugs. Alcohol will lessen your ability to say no.  Have ready excuses available about why you cannot use. If that is difficult, stay away from people who knew you used. HOME CARE INSTRUCTIONS   Both prescription medicines and over-the-counter medicines are used as part of the treatment. It is critical to follow the recommendations of your caregiver at home. Any additional over-the-counter medications or changes in the recommended treatment plan should be discussed with your caregiver first.  It is important to keep fluids down. Juices, soda, Gatorade, or a mixture will help prevent dehydration.  Be prepared for the emotional swings of quitting.  Call your local emergency services if seizures (convulsions) occur or if you are unable to keep liquids down.  Keep a written record of medications you take and times given. Overcoming addictions takes years. Over time you will have a lessening of the craving for narcotics. Talk to your caregiver or a member of your support group if you need more help.  Addiction cannot be cured but it can be stopped. Treatment centers are listed in the yellow pages under: Cocaine, Narcotics, and Alcoholics Anonymous. Most hospitals and clinics can refer you to a specialized care center. Document Released: 04/24/2007 Document Revised: 09/19/2011 Document Reviewed: 04/24/2007 [facility] Patient Information 2013 [place], [place]. Heroin Abuse and  Withdrawal Heroin is an illegal opiate (narcotic) drug. It is very addictive. Once the effects of the drug are felt, the need to use the drug again (known as craving) is strong. When the drug is suddenly stopped after using it on a regular basis, significant and painful physical withdrawal symptoms are experienced.  EFFECTS OF HEROIN USE AND ABUSE The drug produces a profound feeling of pleasure and relaxation (euphoria) that lasts for a short time. This is followed by sleepiness and then agitation with craving for more drugs. After a short time of regular use (days or weeks), dependence on the drug is developed. This means the user will become ill without it (withdrawal) and needs to keep using the drug to function. This is how fast heroin abuse becomes physical dependence and addiction (chemical dependency). EFFECTS ON THE BRAIN Heroin is addictive because it activates regions of the brain that are responsible for producing both the pleasurable sensation of "reward" and physical dependence. Together, these actions account for the user's loss of control and the rapid development of drug dependence. HOW IT IS USED  Heroin is a drug that is mostly taken by a shot with a needle (injection) in the vein. Using a needle can have harmful effects on the user. Often times, the needle is unclean (unsterile), the heroin is contaminated with cutting agents, or heroin is used in combination with other drugs such as alcohol or cocaine.  In recent years, the purity of street heroin has  increased a lot allowing users to "snort" the drug into their noses where it is absorbed through the lining of the nasal passages. You can become addicted this way. Many IV heroin users report they started by snorting. RELATED PROBLEMS  Needle sharing can cause AIDS. The AIDS virus is carried in contaminated blood left in the needle, syringe or other drug-related equipment. When unclean needles are used, the AIDS virus is injected into  the new user. There is no cure or proven vaccine for AIDS to prevent it.  Heroin use during pregnancy is associated with stillbirths. It can also increase the chance for miscarriage. Babies born addicted to heroin must undergo withdrawal after birth. These babies show a number of developmental problems.  Heroin use can cause serious health problems such as liver infection (hepatitis), skin abscesses, vein inflammation and heart disease. SYMPTOMS The signs and symptoms of heroin use include:  Euphoria.  Drowsiness.  Respiratory depression (which can progress until breathing stops).  Small (constricted) pupils and nausea.  Need for increasingly higher doses of the drug to get the same effect.  Weight loss.  "Track" marks (scars) on arms and legs.  Emotional instability. Symptoms of a heroin overdose include:   Shallow breathing.  Pinpoint pupils.  Clammy skin.  Convulsion.  Coma. WITHDRAWAL EFFECTS Withdrawal symptoms include:  Watery eyes.  Runny nose.  Yawning.  Loss of appetite.  Tremors.  Panic.  Chills.  Sweating.  Nausea.  Muscle cramps.  Not being able to sleep well (insomnia).  Depression and mood swings.  Elevations in blood pressure, pulse, respiratory rate and temperature. RISK OF OVERDOSE Because the drug is produced illegally, heroin users risk taking an unusually potent dose (due to differences in purity). This can lead to overdose, coma and possible death. Especially dangerous is the situation where a user has been "clean" (abstinent) from using the drug for a period of time and then relapses. Fatal overdoses can occur because the user fails to account for the change in the body's tolerance to the drug. TREATMENT  There are many treatment options for heroin addiction. They include medications as well as behavioral therapies. Research has demonstrated that many people can successfully stop using heroin when medication is combined with other  supportive services. Patients can return to stable and productive lives, living completely free of all narcotics. Some of the medications and other treatment approaches used are:  Methadone. This is a medication that blocks the effects of heroin for about 24 hours. It has a proven record of success when prescribed at a high enough dosage level for people addicted to heroin.  Naloxone may be used to treat cases of overdose, as well as naltrexone. Both of these block the effects of morphine, heroin and other opiates. People who have become free of narcotics often take naltrexone to avoid relapse.  Buprenorphine is now available for treating addiction to heroin and other opiates. This medication offers less risk of addiction. It can be dispensed in the privacy of a doctor's office.  Several other medications for use in heroin treatment programs are also under study.  There are many effective behavioral treatments available for heroin addiction. These can include residential and outpatient approaches. 12-step programs, such as Narcotics Anonymous, have also proven to be effective. HOME CARE INSTRUCTIONS  Some individuals can successfully stop using heroin at home with medically assisted detoxification. This requires following your caregiver's instructions very carefully. This may include use of some of the following medications and therapies:  Stomach  medicine can be used for the nausea.  Imodium can be used for the diarrhea.  Benadryl can be used for the sleeplessness and restlessness.  Anxiety medication such as Xanax or Ativan. These must be administered exactly as prescribed, usually with the help of a home caregiver.  Avoiding dehydration by drinking more fluids than usual. While water alone is fine, any non-alcoholic fluid is okay (soup, juice, etc.).  Providing quiet, calm support and cooling or warmth as needed.  Call your local emergency services (911 in [place] if seizures occur or if  the patient is unable to keep liquids down.  Keep a written record of medications given and times given. Addiction cannot be cured but it can be treated successfully.   Treatment centers are listed in the yellow pages under:  Substance Abuse.  Narcotics Anonymous.  Drug Abuse Counseling.  Most hospitals and clinics can refer you to a specialized care center.  The [place] government maintains a toll-free number for obtaining treatment referrals:1-567 326 3457 or 514 511 3393 (TDD) and maintains a website: http://findtreatment.RockToxic.pl.  Other websites for additional information are:  www.mentalhealth.RockToxic.pl.  GreatestFeeling.tn.  In [place] treatment resources are listed in each [place] with listings available under:  Oncologist for [employer] or similar titles. Document Released: 06/30/2003 Document Revised: 09/19/2011 Document Reviewed: 06/13/2008 [facility] Patient Information 2013 [place], [place].

## 2012-07-25 NOTE — ED Provider Notes (Signed)
This chart was scribed for Glynn Octave, MD by Bennett Scrape, ED Scribe. This patient was seen in room C28C/C28C and the patient's care was started at 10:10 AM.  Kara Walker is a 22 y.o. female who presents to the Emergency Department for Heroin detox. Pt states that she last used IV heroin 2 days ago. She had an abortion at [facility] in [place] at 5 weeks 2 days ago and states that she has been experiencing vaginal bleeding since. She reports that she believes that the vaginal bleeding is her normal period. She denies HI and SI currently.  PE  ABDOMEN: Soft and non-tender  10:25 AM- Advised pt that her pregnancy test was positive yesterday. Discussed pelvic exam and [place] to make sure that products of conception was not the cause of the vaginal bleeding. Pt states that she would rather follow up with the abortion clinic. Will discharge home   Patient to be discharged with outpatient resources for detox. She refuses to have her positive pregnancy test evaluated further. She states she had an elective abortion sometime last week.   Says she is having some vaginal bleeding off and on. She refuses pelvic exam or vaginal ultrasound to evaluate for retained products and will be leaving AGAINST MEDICAL ADVICE.  BP 107/62  Pulse 84  Temp 98.2 F (36.8 C) (Oral)  Resp 18  SpO2 98%  LMP 07/22/2012  I personally performed the services described in this documentation, which was scribed in my presence. The recorded information has been reviewed and is accurate.   Glynn Octave, MD 07/25/12 1426

## 2012-12-11 ENCOUNTER — Encounter (HOSPITAL_COMMUNITY): Payer: Self-pay | Admitting: *Deleted

## 2012-12-11 ENCOUNTER — Emergency Department (HOSPITAL_COMMUNITY)
Admission: EM | Admit: 2012-12-11 | Discharge: 2012-12-12 | Disposition: A | Discharge status: Home / Self Care | Payer: Self-pay | Attending: Emergency Medicine | Admitting: Emergency Medicine

## 2012-12-11 DIAGNOSIS — R1310 Dysphagia, unspecified: Secondary | ICD-10-CM | POA: Insufficient documentation

## 2012-12-11 DIAGNOSIS — Z3202 Encounter for pregnancy test, result negative: Secondary | ICD-10-CM | POA: Insufficient documentation

## 2012-12-11 DIAGNOSIS — J029 Acute pharyngitis, unspecified: Secondary | ICD-10-CM | POA: Insufficient documentation

## 2012-12-11 DIAGNOSIS — R05 Cough: Secondary | ICD-10-CM | POA: Insufficient documentation

## 2012-12-11 DIAGNOSIS — F1120 Opioid dependence, uncomplicated: Secondary | ICD-10-CM | POA: Insufficient documentation

## 2012-12-11 DIAGNOSIS — R059 Cough, unspecified: Secondary | ICD-10-CM | POA: Insufficient documentation

## 2012-12-11 DIAGNOSIS — Z0489 Encounter for examination and observation for other specified reasons: Secondary | ICD-10-CM | POA: Insufficient documentation

## 2012-12-11 DIAGNOSIS — R630 Anorexia: Secondary | ICD-10-CM | POA: Insufficient documentation

## 2012-12-11 DIAGNOSIS — Z87440 Personal history of urinary (tract) infections: Secondary | ICD-10-CM | POA: Insufficient documentation

## 2012-12-11 HISTORY — DX: Depression, unspecified: F32.A

## 2012-12-11 HISTORY — DX: Major depressive disorder, single episode, unspecified: F32.9

## 2012-12-11 HISTORY — DX: Other psychoactive substance use, unspecified, uncomplicated: F19.90

## 2012-12-11 MED ORDER — DEXAMETHASONE SODIUM PHOSPHATE 10 MG/ML IJ SOLN
10.0000 mg | Freq: Once | INTRAMUSCULAR | Status: AC
  Administered 2012-12-12: 10 mg via INTRAVENOUS
  Filled 2012-12-11: qty 1

## 2012-12-11 MED ORDER — SODIUM CHLORIDE 0.9 % IV BOLUS (SEPSIS)
1000.0000 mL | Freq: Once | INTRAVENOUS | Status: AC
  Administered 2012-12-12: 1000 mL via INTRAVENOUS

## 2012-12-11 MED ORDER — KETOROLAC TROMETHAMINE 30 MG/ML IJ SOLN
30.0000 mg | Freq: Once | INTRAMUSCULAR | Status: AC
  Administered 2012-12-12: 30 mg via INTRAVENOUS
  Filled 2012-12-11: qty 1

## 2012-12-11 NOTE — ED Provider Notes (Signed)
History    This chart was scribed for Junious Silk (PA) non-physician practitioner working with Raeford Razor, MD by Sofie Rower, ED Scribe. This patient was seen in room WTR8/WTR8 and the patient's care was started at 11:04PM.    CSN: [id number]  Arrival date & time 12/11/12  2114   First MD Initiated Contact with Patient 12/11/12 2304      Chief Complaint  Patient presents with  . Sore Throat    (Consider location/radiation/quality/duration/timing/severity/associated sxs/prior treatment) The history is provided by the patient. No language interpreter was used.    Kara Walker is a 22 y.o. female , with a hx of UTI, induced abortion, and drug abuse, who presents to the Emergency Department complaining of gradual, progressively worsening, sore throat, onset four days ago (12/07/12). Associated symptoms include productive gray cough and difficulty swallowing. The pt reports she has been experiencing a sore throat for the past three days, which has inhibited her ability to eat, prompting her concern and desire to seek medical evaluation at [facility] this evening (12/11/12). The pt has gargled salt water, taken ibuprofen, taken Nyquil, and taken dayquil, all of which do not provide relief of the sore throat nor associated symptoms. Furthermore, the pt informs she has been using heroin and cocaine, everyday, for the past year and a half, for which she now desires detox. No SI/HI. Last time using heroin was today.   The pt is a current some day smoker,  however, she does not drink alcohol.      Past Medical History  Diagnosis Date  . UTI (urinary tract infection)     Past Surgical History  Procedure Laterality Date  . No past surgeries    . Induced abortion      History reviewed. No pertinent family history.  History  Substance Use Topics  . Smoking status: Current Some Day Smoker    Types: Cigarettes    Last Attempt to Quit: 04/14/2011  . Smokeless tobacco: Not on file  . Alcohol Use:  No    OB History   Grav Para Term Preterm Abortions TAB SAB Ect Mult Living   0               Review of Systems  Constitutional: Positive for appetite change.  HENT: Positive for sore throat.   All other systems reviewed and are negative.    Allergies  Review of patient's allergies indicates no known allergies.  Home Medications  No current outpatient prescriptions on file.  BP 127/102  Pulse 107  Temp(Src) 98.2 F (36.8 C) (Oral)  Resp 20  SpO2 100%  Physical Exam  Nursing note and vitals reviewed. Constitutional: She is oriented to person, place, and time. She appears well-developed and well-nourished. No distress.  tearful  HENT:  Head: Normocephalic and atraumatic.  Right Ear: External ear normal.  Left Ear: External ear normal.  Nose: Nose normal.  Exudate on tonsils. Significantly enlarged tonsils bilaterally. Hoarse voice.   Eyes: Conjunctivae are normal.  Neck: Normal range of motion.  Cardiovascular: Normal rate, regular rhythm and normal heart sounds.   Pulmonary/Chest: Effort normal and breath sounds normal. No stridor. No respiratory distress. She has no wheezes. She has no rales.  Abdominal: Soft. She exhibits no distension.  Musculoskeletal: Normal range of motion.  Neurological: She is alert and oriented to person, place, and time. She has normal strength.  Skin: Skin is warm and dry. She is not diaphoretic. No erythema.  Psychiatric: She has a normal mood  and affect. Her behavior is normal.    ED Course  Procedures (including critical care time)  DIAGNOSTIC STUDIES: Oxygen Saturation is 100% on room air, normal by my interpretation.    COORDINATION OF CARE:   11:22 PM- Treatment plan discussed with patient. Pt agrees with treatment.  11:30PM- Pt evaluated by attending Physician (Dr. Juleen China). Treatment plan concerning IV fluids, pain management, and administration of antibiotics discussed with patient. Pt agrees with treatment.      Labs  Reviewed  URINALYSIS, ROUTINE W REFLEX MICROSCOPIC - Abnormal; Notable for the following:    Color, Urine AMBER (*)    APPearance CLOUDY (*)    Hgb urine dipstick MODERATE (*)    Bilirubin Urine MODERATE (*)    Protein, ur 100 (*)    Urobilinogen, UA 2.0 (*)    Leukocytes, UA SMALL (*)    All other components within normal limits  CBC WITH DIFFERENTIAL - Abnormal; Notable for the following:    Hemoglobin 11.9 (*)    HCT 34.6 (*)    All other components within normal limits  BASIC METABOLIC PANEL - Abnormal; Notable for the following:    Sodium 133 (*)    Potassium 2.9 (*)    Glucose, Bld 119 (*)    All other components within normal limits  URINE RAPID DRUG SCREEN (HOSP PERFORMED) - Abnormal; Notable for the following:    Opiates POSITIVE (*)    Cocaine POSITIVE (*)    Tetrahydrocannabinol POSITIVE (*)    All other components within normal limits  URINE MICROSCOPIC-ADD ON - Abnormal; Notable for the following:    Bacteria, UA MANY (*)    Casts HYALINE CASTS (*)    All other components within normal limits  BASIC METABOLIC PANEL - Abnormal; Notable for the following:    Glucose, Bld 140 (*)    All other components within normal limits  RAPID STREP SCREEN  CULTURE, GROUP A STREP  URINE CULTURE  POCT PREGNANCY, URINE   No results found.   1. Pharyngitis   2. Opioid dependence       MDM  Patient presents with pharyngitis. Tonsils swollen with exudate bilaterally. No concern for peritonsilar abscess, deep space infection, or ludwig's angina. 1st dose of abx given in ED. IVF, decadron, toradol given. Potassium replaced. ACT team consulted for heroin detox. No SI/HI. Sent home with clonidine, Ambien, and phenergan to help with detox. Dr. Juleen China evaluated patient and agrees with plan. Return instructions given. Vital signs stable for discharge. Patient / Family / Caregiver informed of clinical course, understand medical decision-making process, and agree with  plan.   Medications  sodium chloride 0.9 % bolus 1,000 mL (0 mLs Intravenous Stopped 12/12/12 0111)  dexamethasone (DECADRON) injection 10 mg (10 mg Intravenous Given 12/12/12 0006)  ketorolac (TORADOL) 30 MG/ML injection 30 mg (30 mg Intravenous Given 12/12/12 0006)  magic mouthwash (10 mLs Oral Given 12/12/12 0031)  penicillin g benzathine (BICILLIN LA) 1200000 UNIT/2ML injection 1.2 Million Units (1.2 Million Units Intramuscular Given 12/12/12 0143)  potassium chloride SA (K-DUR,KLOR-CON) CR tablet 60 mEq (60 mEq Oral Given 12/12/12 0540)  potassium chloride SA (K-DUR,KLOR-CON) CR tablet 40 mEq (40 mEq Oral Given 12/12/12 0735)      I personally performed the services described in this documentation, which was scribed in my presence. The recorded information has been reviewed and is accurate.     Mora Bellman, PA-C 12/13/12 [phone]

## 2012-12-11 NOTE — ED Notes (Signed)
Pt in c/o sore throat x2 days, increased pain with eating, also cough and congestion

## 2012-12-12 ENCOUNTER — Encounter (HOSPITAL_COMMUNITY): Payer: Self-pay | Admitting: Emergency Medicine

## 2012-12-12 LAB — BASIC METABOLIC PANEL
BUN: 7 mg/dL (ref 6–23)
BUN: 9 mg/dL (ref 6–23)
CO2: 27 mEq/L (ref 19–32)
Calcium: 9.1 mg/dL (ref 8.4–10.5)
Chloride: 98 mEq/L (ref 96–112)
Creatinine, Ser: 0.73 mg/dL (ref 0.50–1.10)
Creatinine, Ser: 0.64 mg/dL (ref 0.50–1.10)
GFR calc Af Amer: >90 mL/min (ref >90)
GFR calc Af Amer: >90 mL/min (ref >90)
GFR calc non Af Amer: >90 mL/min (ref >90)
GFR calc non Af Amer: >90 mL/min (ref >90)
Glucose, Bld: 140 mg/dL — ABNORMAL HIGH (ref 70–99)
Potassium: 2.9 mEq/L — ABNORMAL LOW (ref 3.5–5.1)
Potassium: 3.5 mEq/L (ref 3.5–5.1)
Sodium: 136 mEq/L (ref 135–145)

## 2012-12-12 LAB — CBC WITH DIFFERENTIAL/PLATELET
Basophils Absolute: 0.1 10*3/uL (ref 0.0–0.1)
Basophils Relative: 1 % (ref 0–1)
Eosinophils Relative: 1 % (ref 0–5)
HCT: 34.6 % — ABNORMAL LOW (ref 36.0–46.0)
MCHC: 34.4 g/dL (ref 30.0–36.0)
Monocytes Absolute: 0.6 10*3/uL (ref 0.1–1.0)
Neutro Abs: 6 10*3/uL (ref 1.7–7.7)
Platelets: 242 10*3/uL (ref 150–400)
RDW: 12.8 % (ref 11.5–15.5)

## 2012-12-12 LAB — URINALYSIS, ROUTINE W REFLEX MICROSCOPIC
Glucose, UA: NEGATIVE mg/dL
Protein, ur: 100 mg/dL — AB
Specific Gravity, Urine: 1.029 (ref 1.005–1.030)
pH: 5.5 (ref 5.0–8.0)

## 2012-12-12 LAB — POCT PREGNANCY, URINE: Preg Test, Ur: NEGATIVE

## 2012-12-12 LAB — URINE MICROSCOPIC-ADD ON

## 2012-12-12 MED ORDER — PENICILLIN G BENZATHINE 1200000 UNIT/2ML IM SUSP
1.2000 10*6.[IU] | Freq: Once | INTRAMUSCULAR | Status: AC
  Administered 2012-12-12: 1.2 10*6.[IU] via INTRAMUSCULAR
  Filled 2012-12-12: qty 2

## 2012-12-12 MED ORDER — POTASSIUM CHLORIDE CRYS ER 20 MEQ PO TBCR
60.0000 meq | EXTENDED_RELEASE_TABLET | Freq: Once | ORAL | Status: AC
  Administered 2012-12-12: 60 meq via ORAL
  Filled 2012-12-12: qty 3

## 2012-12-12 MED ORDER — ZOLPIDEM TARTRATE 5 MG PO TABS: 5.0000 mg | ORAL_TABLET | Freq: Every evening | ORAL | Status: DC | PRN

## 2012-12-12 MED ORDER — POTASSIUM CHLORIDE CRYS ER 20 MEQ PO TBCR
40.0000 meq | EXTENDED_RELEASE_TABLET | Freq: Once | ORAL | Status: AC
  Administered 2012-12-12: 40 meq via ORAL
  Filled 2012-12-12: qty 2

## 2012-12-12 MED ORDER — ONDANSETRON HCL 4 MG PO TABS: 4.0000 mg | ORAL_TABLET | Freq: Three times a day (TID) | ORAL | Status: DC | PRN

## 2012-12-12 MED ORDER — ALUM & MAG HYDROXIDE-SIMETH 200-200-20 MG/5ML PO SUSP: 30.0000 mL | ORAL | Status: DC | PRN

## 2012-12-12 MED ORDER — MAGIC MOUTHWASH
10.0000 mL | Freq: Once | ORAL | Status: AC
  Administered 2012-12-12: 10 mL via ORAL
  Filled 2012-12-12: qty 10

## 2012-12-12 MED ORDER — CLONIDINE HCL 0.1 MG PO TABS: 0.1000 mg | ORAL_TABLET | Freq: Three times a day (TID) | ORAL | Status: DC | PRN

## 2012-12-12 MED ORDER — PROMETHAZINE HCL 25 MG PO TABS: 25.0000 mg | ORAL_TABLET | Freq: Four times a day (QID) | ORAL | Status: DC | PRN

## 2012-12-12 MED ORDER — ACETAMINOPHEN 325 MG PO TABS: 650.0000 mg | ORAL_TABLET | ORAL | Status: DC | PRN

## 2012-12-12 MED ORDER — LORAZEPAM 1 MG PO TABS: 1.0000 mg | ORAL_TABLET | Freq: Three times a day (TID) | ORAL | Status: DC | PRN

## 2012-12-12 NOTE — Progress Notes (Signed)
[facility] did not get to see patient but will be sending her information using the address provided.

## 2012-12-12 NOTE — ED Notes (Signed)
Kohut MD made aware of 2.9 potassium. 60 Meq PO ordered.

## 2012-12-12 NOTE — ED Notes (Addendum)
Pt & belongings wanded.

## 2012-12-12 NOTE — ED Notes (Signed)
Pt expressed that she would like detox from Heroin. Junious Silk, PA notified.

## 2012-12-12 NOTE — BH Assessment (Signed)
Assessment Note   Kara Walker is a 22 y.o. female who [facility] for [facility] from heroin, crack and thc.  Pt denies SI/HI/Psych.  Pt reports the following: pt consumes 1 gram of heroin daily, last use was 12/12/12; pt has been using crack for 4 mos and uses thc(use varies).  Pt reports inpt admissions with [facility] and [facility] for(2010,2011,2012).  Pt currently lives with father and states her relationship is deteriorating and she is doing things that she thought she would never do because of her habit.  Pt c/o w/d sxs: sweats.  Pt given referrals for outpatient services and d/c home.    Axis I: Polysub Dep  Axis II: Deferred Axis III:  Past Medical History  Diagnosis Date  . UTI (urinary tract infection)   . IV drug user   . Depression    Axis IV: other psychosocial or environmental problems, problems related to social environment and problems with primary support group Axis V: 51-60 moderate symptoms  Past Medical History:  Past Medical History  Diagnosis Date  . UTI (urinary tract infection)   . IV drug user   . Depression     Past Surgical History  Procedure Laterality Date  . No past surgeries    . Induced abortion      Family History: History reviewed. No pertinent family history.  Social History:  reports that she has been smoking Cigarettes.  She has been smoking about 0.00 packs per day. She does not have any smokeless tobacco history on file. She reports that she uses illicit drugs (Heroin, Oxycodone, IV, "Crack" cocaine, and Marijuana) about 7 times per week. She reports that she does not drink alcohol.  Additional Social History:  Alcohol / Drug Use Pain Medications: None  Prescriptions: None  Over the Counter: None  History of alcohol / drug use?: Yes Longest period of sobriety (when/how long): Only when in detox Negative Consequences of Use: Financial;Personal relationships;Work / Programmer, multimedia Withdrawal Symptoms: Sweats Substance #1 Name of Substance 1:  Heroin--DOC  1 - Age of First Use: 18 YOF  1 - Amount (size/oz): 1+ Gram  1 - Frequency: Daily  1 - Duration: On-going  1 - Last Use / Amount: 12/12/12 Substance #2 Name of Substance 2: Cocaine-Crack  2 - Age of First Use: 21 YOF 2 - Amount (size/oz): Varies  2 - Frequency: Daily  2 - Duration: 4 mos  2 - Last Use / Amount: 12/12/12 Substance #3 Name of Substance 3: THC  3 - Age of First Use: 17 YOF  3 - Amount (size/oz): Varies  3 - Frequency: Monthly  3 - Duration: On-going  3 - Last Use / Amount: 12/12/12  CIWA: CIWA-Ar BP: 98/64 mmHg Pulse Rate: 72 COWS: Clinical Opiate Withdrawal Scale (COWS) Resting Pulse Rate: Pulse Rate 80 or below Sweating: Flushed or Observable moistness on face Restlessness: Able to sit still Pupil Size: Pupils pinned or normal size for room light Bone or Joint Aches: Not present Runny Nose or Tearing: Not present GI Upset: No GI symptoms Tremor: No tremor Yawning: No yawning Anxiety or Irritability: None Gooseflesh Skin: Skin is smooth COWS Total Score: 2  Allergies: No Known Allergies  Home Medications:  (Not in a hospital admission)  OB/GYN Status:  No LMP recorded.  General Assessment Data Location of Assessment: [facility] Living Arrangements: Parent (Lives with father ) Can pt return to current living arrangement?: Yes Admission Status: Voluntary Is patient capable of signing voluntary admission?: Yes Transfer from:  Acute Hospital Referral Source: MD  Education Status Is patient currently in school?: No Current Grade: None  Highest grade of school patient has completed: None  Name of school: None  Contact person: None   Risk to self Suicidal Ideation: No Suicidal Intent: No Is patient at risk for suicide?: No Suicidal Plan?: No Access to Means: No What has been your use of drugs/alcohol within the last 12 months?: Abusing: Crack, Heroin, THC  Previous Attempts/Gestures: No How many times?: 0 Other Self Harm Risks: None   Triggers for Past Attempts: None known Intentional Self Injurious Behavior: None Family Suicide History: No Recent stressful life event(s): Other (Comment) (Chronic SA) Persecutory voices/beliefs?: No Depression: Yes Depression Symptoms: Feeling angry/irritable Substance abuse history and/or treatment for substance abuse?: Yes Suicide prevention information given to non-admitted patients: Not applicable  Risk to Others Homicidal Ideation: No Thoughts of Harm to Others: No Current Homicidal Intent: No Current Homicidal Plan: No Access to Homicidal Means: No Identified Victim: None  History of harm to others?: No Assessment of Violence: None Noted Violent Behavior Description: None  Does patient have access to weapons?: No Criminal Charges Pending?: No Does patient have a court date: No  Psychosis Hallucinations: None noted Delusions: None noted  Mental Status Report Appear/Hygiene: Disheveled;Poor hygiene Eye Contact: Fair Motor Activity: Unremarkable Speech: Logical/coherent Level of Consciousness: Drowsy Mood: Irritable Affect: Irritable Anxiety Level: None Thought Processes: Coherent;Relevant Judgement: Unimpaired Orientation: Person;Place;Time;Situation Obsessive Compulsive Thoughts/Behaviors: None  Cognitive Functioning Concentration: Normal Memory: Recent Intact;Remote Intact IQ: Average Insight: Fair Impulse Control: Fair Appetite: Poor Weight Loss: 0 Weight Gain: 0 Sleep: Decreased Total Hours of Sleep:  (No sleep x2 days ) Vegetative Symptoms: None  ADLScreening [facility] Assessment Services) Patient's cognitive ability adequate to safely complete daily activities?: Yes Patient able to express need for assistance with ADLs?: Yes Independently performs ADLs?: Yes (appropriate for developmental age)  Abuse/Neglect [facility]) Physical Abuse: Denies Verbal Abuse: Denies Sexual Abuse: Denies  Prior Inpatient Therapy Prior Inpatient Therapy: Yes Prior  Therapy Dates: 2010,2011,2012 Prior Therapy Facilty/Provider(s): [facility], [facility]  Reason for Treatment: Detox/Rehab   Prior Outpatient Therapy Prior Outpatient Therapy: No Prior Therapy Dates: None  Prior Therapy Facilty/Provider(s): None  Reason for Treatment: None   ADL Screening (condition at time of admission) Patient's cognitive ability adequate to safely complete daily activities?: Yes Patient able to express need for assistance with ADLs?: Yes Independently performs ADLs?: Yes (appropriate for developmental age) Weakness of Legs: None Weakness of Arms/Hands: None  Home Assistive Devices/Equipment Home Assistive Devices/Equipment: None  Therapy Consults (therapy consults require a physician order) PT Evaluation Needed: No OT Evalulation Needed: No SLP Evaluation Needed: No Abuse/Neglect Assessment (Assessment to be complete while patient is alone) Physical Abuse: Denies Verbal Abuse: Denies Sexual Abuse: Denies Exploitation of patient/patient's resources: Denies Self-Neglect: Denies Values / Beliefs Cultural Requests During Hospitalization: None Spiritual Requests During Hospitalization: None Consults Spiritual Care Consult Needed: No Social Work Consult Needed: No Merchant navy officer (For Healthcare) Advance Directive: Patient does not have advance directive;Patient would not like information Pre-existing out of facility DNR order (yellow form or pink MOST form): No Nutrition Screen- [facility] Patient's home diet: Regular Have you recently lost weight without trying?: No Have you been eating poorly because of a decreased appetite?: No Malnutrition Screening Tool Score: 0  Additional Information 1:1 In Past 12 Months?: No CIRT Risk: No Elopement Risk: No Does patient have medical clearance?: Yes     Disposition:  Disposition Initial Assessment Completed for this Encounter: Yes  Disposition of Patient: Other dispositions;Referred to (Referrals provided  ) Other disposition(s): Referred to outside facility;Other (Comment) (Referrals provided ) Patient referred to: Outpatient clinic referral  On Site Evaluation by:   Reviewed with Physician:     Murrell Redden 12/12/2012 8:11 AM

## 2012-12-12 NOTE — ED Notes (Signed)
Pt has multiple track marks on both arms

## 2012-12-12 NOTE — ED Notes (Signed)
Unsuccessful attempt at drawing labs by this Clinical research associate and RN. Lab made aware and will be down to collect lab.

## 2012-12-12 NOTE — ED Notes (Signed)
Attempted blood draw x 2. Pt did not tolerate well. 2nd staff nurse to attempt.

## 2012-12-13 NOTE — ED Provider Notes (Signed)
Medical screening examination/treatment/procedure(s) were performed by non-physician practitioner and as supervising physician I was immediately available for consultation/collaboration.  Tyrihanna Wingert, MD 12/13/12 0533 

## 2012-12-14 LAB — CULTURE, GROUP A STREP

## 2012-12-14 LAB — URINE CULTURE: Colony Count: >=100000

## 2012-12-15 ENCOUNTER — Emergency Department (HOSPITAL_COMMUNITY): Admission: EM | Admit: 2012-12-15 | Discharge: 2012-12-15 | Discharge status: Against Medical Advice | Payer: Self-pay

## 2012-12-15 ENCOUNTER — Telehealth (HOSPITAL_COMMUNITY): Payer: Self-pay | Admitting: Emergency Medicine

## 2012-12-15 NOTE — ED Notes (Signed)
Post ED Visit - Positive Culture Follow-up: Successful Patient Follow-Up  Culture assessed and recommendations reviewed by: [x]  Wes Dulaney, Pharm.D., BCPS []  Celedonio Miyamoto, Pharm.D., BCPS []  Georgina Pillion, Pharm.D., BCPS []  [place], [address].D., BCPS, AAHIVP []  Estella Husk, Pharm.D., BCPS, AAHIVP  Positive urine culture  [x]  Patient discharged without antimicrobial prescription and treatment is now indicated []  Organism is resistant to prescribed ED discharge antimicrobial []  Patient with positive blood cultures  Changes discussed with ED provider: Fayrene Helper PA-C New antibiotic prescription: If symptomatic, Keflex 500 mg PO BID x 7 days    Kylie A Holland 12/15/2012, 4:38 PM

## 2012-12-15 NOTE — Progress Notes (Signed)
ED Antimicrobial Stewardship Positive Culture Follow Up   Kara Walker is an 22 y.o. female who presented to [facility] on 12/11/2012 with a chief complaint of  Chief Complaint  Patient presents with  . Sore Throat  . Medical Clearance    Recent Results (from the past 720 hour(s))  RAPID STREP SCREEN     Status: None   Collection Time    12/11/12 11:45 PM      Result Value Range Status   Streptococcus, Group A Screen (Direct) NEGATIVE  NEGATIVE Final   Comment: DELTA CHECK NOTED     (NOTE)     A Rapid Antigen test may result negative if the antigen level in the     sample is below the detection level of this test. The FDA has not     cleared this test as a stand-alone test therefore the rapid antigen     negative result has reflexed to a Group A Strep culture.  CULTURE, GROUP A STREP     Status: None   Collection Time    12/11/12 11:45 PM      Result Value Range Status   Specimen Description THROAT   Final   Special Requests NONE   Final   Culture STREPTOCOCCUS,BETA HEMOLYIC NOT GROUP A   Final   Report Status 12/14/2012 FINAL   Final  URINE CULTURE     Status: None   Collection Time    12/12/12  1:17 AM      Result Value Range Status   Specimen Description URINE, CLEAN CATCH   Final   Special Requests NONE   Final   Culture  Setup Time 12/12/2012 12:18   Final   Colony Count >=100,000 COLONIES/ML   Final   Culture ESCHERICHIA COLI   Final   Report Status 12/14/2012 FINAL   Final   Organism ID, Bacteria ESCHERICHIA COLI   Final    [x]  Treated with Bicillin LA 1.2 million units x 1 for pharyngitis but may not be adequate treatment for urinary Ecoli that has grown (no other antimicrobial agent prescribed).  Recommendation: Perform symptom check. If urinary tract infection symptoms are present:   New antibiotic prescription: Cephalexin 500mg  po BID x 7 days  ED Provider: Fayrene Helper, PA-C   [name] 12/15/2012, 4:04 PM Infectious Diseases Pharmacist Phone#  [phone]

## 2012-12-15 NOTE — ED Notes (Signed)
Pt states she does not want to stay. Denies SI/HI. Left pre-triage.

## 2012-12-16 ENCOUNTER — Telehealth (HOSPITAL_COMMUNITY): Payer: Self-pay | Admitting: Emergency Medicine

## 2013-01-01 ENCOUNTER — Encounter (HOSPITAL_COMMUNITY): Payer: Self-pay | Admitting: *Deleted

## 2013-01-01 ENCOUNTER — Emergency Department (HOSPITAL_COMMUNITY)
Admission: EM | Admit: 2013-01-01 | Discharge: 2013-01-01 | Disposition: A | Discharge status: Rehabilitation Facility | Payer: Self-pay | Attending: Emergency Medicine | Admitting: Emergency Medicine

## 2013-01-01 DIAGNOSIS — F1910 Other psychoactive substance abuse, uncomplicated: Secondary | ICD-10-CM

## 2013-01-01 DIAGNOSIS — Z3202 Encounter for pregnancy test, result negative: Secondary | ICD-10-CM | POA: Insufficient documentation

## 2013-01-01 DIAGNOSIS — F1110 Opioid abuse, uncomplicated: Secondary | ICD-10-CM | POA: Insufficient documentation

## 2013-01-01 DIAGNOSIS — Z8659 Personal history of other mental and behavioral disorders: Secondary | ICD-10-CM | POA: Insufficient documentation

## 2013-01-01 DIAGNOSIS — Z87440 Personal history of urinary (tract) infections: Secondary | ICD-10-CM | POA: Insufficient documentation

## 2013-01-01 DIAGNOSIS — R748 Abnormal levels of other serum enzymes: Secondary | ICD-10-CM | POA: Insufficient documentation

## 2013-01-01 DIAGNOSIS — F17200 Nicotine dependence, unspecified, uncomplicated: Secondary | ICD-10-CM | POA: Insufficient documentation

## 2013-01-01 LAB — COMPREHENSIVE METABOLIC PANEL
Albumin: 3.3 g/dL — ABNORMAL LOW (ref 3.5–5.2)
Alkaline Phosphatase: 61 U/L (ref 39–117)
BUN: 7 mg/dL (ref 6–23)
CO2: 30 mEq/L (ref 19–32)
Chloride: 102 mEq/L (ref 96–112)
GFR calc Af Amer: >90 mL/min (ref >90)
Glucose, Bld: 84 mg/dL (ref 70–99)
Potassium: 3.5 mEq/L (ref 3.5–5.1)
Total Bilirubin: 0.2 mg/dL — ABNORMAL LOW (ref 0.3–1.2)

## 2013-01-01 LAB — CBC
HCT: 38.1 % (ref 36.0–46.0)
Hemoglobin: 12.9 g/dL (ref 12.0–15.0)
RBC: 4.43 MIL/uL (ref 3.87–5.11)
WBC: 4 10*3/uL (ref 4.0–10.5)

## 2013-01-01 LAB — POCT PREGNANCY, URINE: Preg Test, Ur: NEGATIVE

## 2013-01-01 LAB — ACETAMINOPHEN LEVEL: Acetaminophen (Tylenol), Serum: <15 ug/mL (ref 10–30)

## 2013-01-01 LAB — RAPID URINE DRUG SCREEN, HOSP PERFORMED
Barbiturates: NOT DETECTED
Benzodiazepines: NOT DETECTED

## 2013-01-01 LAB — ETHANOL: Alcohol, Ethyl (B): <11 mg/dL (ref 0–11)

## 2013-01-01 MED ORDER — IBUPROFEN 400 MG PO TABS: 600.0000 mg | ORAL_TABLET | Freq: Three times a day (TID) | ORAL | Status: DC | PRN

## 2013-01-01 MED ORDER — ALUM & MAG HYDROXIDE-SIMETH 200-200-20 MG/5ML PO SUSP: 30.0000 mL | ORAL | Status: DC | PRN

## 2013-01-01 MED ORDER — ONDANSETRON HCL 4 MG PO TABS: 4.0000 mg | ORAL_TABLET | Freq: Three times a day (TID) | ORAL | Status: DC | PRN

## 2013-01-01 MED ORDER — ACETAMINOPHEN 325 MG PO TABS: 650.0000 mg | ORAL_TABLET | ORAL | Status: DC | PRN

## 2013-01-01 MED ORDER — ZOLPIDEM TARTRATE 5 MG PO TABS: 5.0000 mg | ORAL_TABLET | Freq: Every evening | ORAL | Status: DC | PRN

## 2013-01-01 NOTE — ED Provider Notes (Signed)
Accepted at [facility] for detox. 1815 Patient aware she needs followup for liver tests.  Hurman Horn, MD 01/03/13 [phone]

## 2013-01-01 NOTE — BH Assessment (Signed)
Assessment Note  Update:  Received call from Raoul at [facility] stating pt has been accepted there for detox pending authorization from [facility].  Called [facility], left message @ 1610.  Called again @ 1715, as had not heard from them (on phone with Delice Bison at [facility] until 1758), gave clinical, and received [facility] # [id number] for [id number].  Called and gave TAR to [place] at [facility] @ 1800.  Updated EDP Bednar and ED staff.  [name] father here to transport pt and [facility] aware of this.  Pt to be discharged to [facility] for detox.  Updated assessment disposition.    Disposition:  Disposition Initial Assessment Completed for this Encounter: Yes Disposition of Patient: Inpatient treatment program Type of inpatient treatment program: Adult Other disposition(s): Referred to outside facility Patient referred to: [facility] (Pt accepted [facility])  On Site Evaluation by:   Reviewed with Physician:  Jocabed, Cheese 01/01/2013 6:31 PM

## 2013-01-01 NOTE — ED Notes (Signed)
PA at bedside.

## 2013-01-01 NOTE — ED Notes (Signed)
In paper scrubs, belongings at nurses station to be inventoried, security at bedside to wand pt, aware of need for urine sample

## 2013-01-01 NOTE — ED Notes (Signed)
Discussed with [facility] plan of care for pt. Pt has room at [facility], waiting for insurance information to be complete. Dad, Shey Yott, will transport pt to facility. Pt resting comfortably in bed, chest visibly rising and falling.

## 2013-01-01 NOTE — ED Notes (Signed)
Patient lunch ordered

## 2013-01-01 NOTE — ED Notes (Signed)
Pt and belongings moved to Pod C, report given to Amy

## 2013-01-01 NOTE — BH Assessment (Addendum)
Assessment Note   Kara Walker is an 22 y.o. female that presents to [facility] with her father requesting detox from heroin.  Pt reported she has been using daily over the past month and has had ongoing use for years.  Pt reports injecting 1+ grams daily, last use $ 25 at 0745 this morning.  Pt also admits to crack cocaine use, last use $20 yesterday.  Pt admits to monthly marijuana use and cannot recall the amount or the last time she smoked it.  Pt denies current withdrawal sx.  Pt denies current or past SI/HI or psychosis.  Pt is calm and cooperative, although has a depressed affect and somewhat irritable mood.  Pt admits to current depressive sx and grief for the loss of her mother who died 2 years ago from a recreational overdose on drugs.  Pt stated her dad is supportive.  Pt has had several inpatient admissions for detox in the past for heroin and other opiates.  Pt also went through SA-IOP at [facility] in 2012.  Pt is not currently in any treatment.  Pt reported she was arrested yesterday for possession of heroin and drug paraphernalia in her home after she called the police because her friend that was in her home using with her overdosed and "I didn't want him to die, so I called the police and got arrested because the drugs and stuff were in my house."  Pt stated she called [facility] and there is a bed saved for her.  This was in fact confirmed by this Clinical research associate and referral sent for review.  Completed assessment and [facility] prescreen.  Faxed there for review.  Pt stated her father can transport her there once accepted.  Note:  This Clinical research associate has to wait to see pt after being medically cleared.  Per PA Reola Calkins, pt is cleared but needs to follow up for elevated liver enzymes.  Updated ED staff.    Axis I: 304.80 Polysubstance Dependence, 311 Depressive Disorder NOS Axis II: Deferred Axis III:  Past Medical History  Diagnosis Date  . UTI (urinary tract infection)   . IV drug user   . Depression    Axis IV: economic  problems, housing problems, occupational problems, other psychosocial or environmental problems, problems related to legal system/crime, problems with access to health care services and problems with primary support group Axis V: 31-40 impairment in reality testing  Past Medical History:  Past Medical History  Diagnosis Date  . UTI (urinary tract infection)   . IV drug user   . Depression     Past Surgical History  Procedure Laterality Date  . No past surgeries    . Induced abortion      Family History: No family history on file.  Social History:  reports that she has been smoking Cigarettes.  She has been smoking about 0.00 packs per day. She does not have any smokeless tobacco history on file. She reports that she uses illicit drugs (Heroin, IV, "Crack" cocaine, and Marijuana) about 7 times per week. She reports that she does not drink alcohol.  Additional Social History:  Alcohol / Drug Use Pain Medications: None  Prescriptions: None  Over the Counter: None  History of alcohol / drug use?: Yes Longest period of sobriety (when/how long): 90 days - June 2013 Negative Consequences of Use: Financial;Personal relationships;Work / Programmer, multimedia Withdrawal Symptoms:  (pt denies) Substance #1 Name of Substance 1: Heroin - IV 1 - Age of First Use: 18 1 - Amount (size/oz):  1 + gram 1 - Frequency: daily 1 - Duration: ongong 1 - Last Use / Amount: Today - 01/01/13 - $25 @ 0745 Substance #2 Name of Substance 2: Cocaine-Crack  2 - Age of First Use: 21 2 - Amount (size/oz): Varies 2 - Frequency: 1 x/week 2 - Duration: ongoing 2 - Last Use / Amount: 12/31/12 - $20 Substance #3 Name of Substance 3: THC  3 - Age of First Use: 17 3 - Amount (size/oz): Varies 3 - Frequency: Monthly 3 - Duration: ongoing 3 - Last Use / Amount: Pt cannot remember by report  CIWA: CIWA-Ar BP: 91/54 mmHg Pulse Rate: 87 COWS: Clinical Opiate Withdrawal Scale (COWS) Resting Pulse Rate: Pulse Rate 80 or  below Sweating: No report of chills or flushing Restlessness: Able to sit still Pupil Size: Pupils pinned or normal size for room light Bone or Joint Aches: Not present Runny Nose or Tearing: Not present GI Upset: No GI symptoms Tremor: No tremor Yawning: No yawning Anxiety or Irritability: None Gooseflesh Skin: Skin is smooth COWS Total Score: 0  Allergies: No Known Allergies  Home Medications:  (Not in a hospital admission)  OB/GYN Status:  Patient's last menstrual period was 12/30/2012.  General Assessment Data Location of Assessment: [facility] ED Living Arrangements: Parent (Lives with father) Can pt return to current living arrangement?: Yes Admission Status: Voluntary Is patient capable of signing voluntary admission?: Yes Transfer from: Home Referral Source: Self/Family/Friend  Education Status Is patient currently in school?: No  Risk to self Suicidal Ideation: No Suicidal Intent: No Is patient at risk for suicide?: No Suicidal Plan?: No Access to Means: No What has been your use of drugs/alcohol within the last 12 months?: Pt admits to daily heroin use, crack and marijuana use Previous Attempts/Gestures: No How many times?: 0 Other Self Harm Risks: pt denies Triggers for Past Attempts: None known Intentional Self Injurious Behavior: Damaging Comment - Self Injurious Behavior: Ongoing SA Family Suicide History: No ([name] mother did unintentionally overdose) Recent stressful life event(s): Financial Problems;Legal Issues;Recent negative physical changes;Turmoil (Comment);Other (Comment) (Ongoing SA, financial, job, grief over mothers' death 2 yrs ) Persecutory voices/beliefs?: No Depression: Yes Depression Symptoms: Despondent;Loss of interest in usual pleasures;Feeling worthless/self pity;Feeling angry/irritable Substance abuse history and/or treatment for substance abuse?: Yes Suicide prevention information given to non-admitted patients: Not applicable  Risk to  Others Homicidal Ideation: No Thoughts of Harm to Others: No Current Homicidal Intent: No Current Homicidal Plan: No Access to Homicidal Means: No Identified Victim: pt denies History of harm to others?: No Assessment of Violence: None Noted Violent Behavior Description: na - pt calm, cooperative Does patient have access to weapons?: No Criminal Charges Pending?: Yes Describe Pending Criminal Charges: Possession Does patient have a court date: Yes Court Date: 01/29/13  Psychosis Hallucinations: None noted Delusions: None noted  Mental Status Report Appear/Hygiene: Disheveled Eye Contact: Fair Motor Activity: Freedom of movement;Unremarkable Speech: Logical/coherent Level of Consciousness: Drowsy Mood: Depressed;Empty Affect: Depressed;Appropriate to circumstance Anxiety Level: None Thought Processes: Coherent;Relevant Judgement: Impaired Orientation: Person;Place;Time;Situation Obsessive Compulsive Thoughts/Behaviors: None  Cognitive Functioning Concentration: Normal Memory: Recent Intact;Remote Impaired IQ: Average Insight: Fair Impulse Control: Poor Appetite: Poor Weight Loss: 15 Weight Gain: 0 Sleep: No Change Total Hours of Sleep:  (varies) Vegetative Symptoms: None  ADLScreening [facility] Assessment Services) Patient's cognitive ability adequate to safely complete daily activities?: Yes Patient able to express need for assistance with ADLs?: Yes Independently performs ADLs?: Yes (appropriate for developmental age)  Abuse/Neglect [facility]) Physical Abuse: Denies Verbal Abuse:  Denies Sexual Abuse: Denies  Prior Inpatient Therapy Prior Inpatient Therapy: Yes Prior Therapy Dates: 2010,2011,2012 Prior Therapy Facilty/Provider(s): [facility]  Reason for Treatment: Detox/Rehab   Prior Outpatient Therapy Prior Outpatient Therapy: No Prior Therapy Dates: None  Prior Therapy Facilty/Provider(s): None  Reason for Treatment: None   ADL Screening (condition at  time of admission) Patient's cognitive ability adequate to safely complete daily activities?: Yes Patient able to express need for assistance with ADLs?: Yes Independently performs ADLs?: Yes (appropriate for developmental age)  Home Assistive Devices/Equipment Home Assistive Devices/Equipment: None    Abuse/Neglect Assessment (Assessment to be complete while patient is alone) Physical Abuse: Denies Verbal Abuse: Denies Sexual Abuse: Denies Exploitation of patient/patient's resources: Denies Self-Neglect: Denies Values / Beliefs Cultural Requests During Hospitalization: None Spiritual Requests During Hospitalization: None Consults Spiritual Care Consult Needed: No Social Work Consult Needed: No Merchant navy officer (For Healthcare) Advance Directive: Patient does not have advance directive;Patient would not like information    Additional Information 1:1 In Past 12 Months?: No CIRT Risk: No Elopement Risk: No Does patient have medical clearance?: Yes     Disposition:  Disposition Initial Assessment Completed for this Encounter: Yes Disposition of Patient: Referred to;Inpatient treatment program Type of inpatient treatment program: Adult Other disposition(s): Referred to outside facility Patient referred to: [facility]  On Site Evaluation by:   Reviewed with Physician:  Glade Nurse, PA-C   Kayelee, Herbig 01/01/2013 2:58 PM

## 2013-01-01 NOTE — ED Provider Notes (Signed)
History    CSN: [id number] Arrival date & time 01/01/13  4098  First MD Initiated Contact with Patient 01/01/13 1011     Chief Complaint  Patient presents with  . Medical Clearance   (Consider location/radiation/quality/duration/timing/severity/associated sxs/prior Treatment) HPI Comments: Kara Walker is a 22 y.o. female , with a hx of UTI, induced abortion, and drug abuse, seeking detox from IV heroin. Pt states she last used about $20 worth at 7:30am this morning. Pt has hx of seeking detox and has been unsuccessful at remaining clean. Pt also has had inpt care previously at [facility] and [facility] (2010, 2011, 2012). Detox sx typically include nausea, vomiting, chills, anxiety, but pt does not endorse any of these sx presently. No somatic complaints. No hx of seizures. Pt was here 12/11/12 where she was referred for outpt detox, but said that "didn't work." She "needs inpatient." Pt states she has a bed waiting for her at [facility] in [place] if she can get medical clearance today.  Pt states no SI/HI or audio/visual hallucinations today.  Past Medical History  Diagnosis Date  . UTI (urinary tract infection)   . IV drug user   . Depression    Past Surgical History  Procedure Laterality Date  . No past surgeries    . Induced abortion     No family history on file. History  Substance Use Topics  . Smoking status: Current Some Day Smoker    Types: Cigarettes    Last Attempt to Quit: 04/14/2011  . Smokeless tobacco: Not on file  . Alcohol Use: No   OB History   Grav Para Term Preterm Abortions TAB SAB Ect Mult Living   0              Review of Systems  Constitutional: Negative for fever and diaphoresis.  HENT: Negative for neck pain and neck stiffness.   Eyes: Negative for visual disturbance.  Respiratory: Negative for apnea, chest tightness and shortness of breath.   Cardiovascular: Negative for chest pain and palpitations.  Gastrointestinal: Negative for nausea, vomiting,  diarrhea and constipation.  Genitourinary: Negative for dysuria.  Musculoskeletal: Negative for gait problem.  Skin: Negative for rash.  Neurological: Negative for dizziness, weakness, light-headedness, numbness and headaches.    Allergies  Review of patient's allergies indicates no known allergies.  Home Medications  No current outpatient prescriptions on file. BP 113/64  Pulse 73  Temp(Src) 97.6 F (36.4 C) (Oral)  Resp 18  SpO2 98%  LMP 12/30/2012 Physical Exam  Nursing note and vitals reviewed. Constitutional: She is oriented to person, place, and time. No distress.  HENT:  Head: Normocephalic and atraumatic.  Eyes: Conjunctivae and EOM are normal. Pupils are equal, round, and reactive to light.  Neck: Normal range of motion. Neck supple.  No meningeal signs  Cardiovascular: Normal rate, regular rhythm, normal heart sounds and intact distal pulses.  Exam reveals no gallop and no friction rub.   No murmur heard. Pulmonary/Chest: Effort normal and breath sounds normal. No respiratory distress. She has no wheezes. She has no rales. She exhibits no tenderness.  Abdominal: Soft. Bowel sounds are normal. She exhibits no distension. There is no tenderness. There is no rebound and no guarding.  Musculoskeletal: Normal range of motion. She exhibits no edema and no tenderness.  FROM to upper and lower extremities  Neurological: She is alert and oriented to person, place, and time. No cranial nerve deficit.  Speech is clear and goal oriented, follows commands Sensation normal  to light touch and two point discrimination Moves extremities without ataxia, coordination intact Normal gait and balance Normal strength in upper and lower extremities bilaterally including dorsiflexion and plantar flexion, strong and equal grip strength   Skin: Skin is warm and dry. She is not diaphoretic. No erythema.  Psychiatric:  Calm, cooperative    ED Course  Procedures (including critical care  time) Labs Reviewed  COMPREHENSIVE METABOLIC PANEL - Abnormal; Notable for the following:    Albumin 3.3 (*)    AST 196 (*)    ALT 262 (*)    Total Bilirubin 0.2 (*)    All other components within normal limits  SALICYLATE LEVEL - Abnormal; Notable for the following:    Salicylate Lvl <2.0 (*)    All other components within normal limits  URINE RAPID DRUG SCREEN (HOSP PERFORMED) - Abnormal; Notable for the following:    Opiates POSITIVE (*)    Cocaine POSITIVE (*)    Tetrahydrocannabinol POSITIVE (*)    All other components within normal limits  ACETAMINOPHEN LEVEL  CBC  ETHANOL  HEPATITIS PANEL, ACUTE  POCT PREGNANCY, URINE   No results found. No diagnosis found.  MDM  Pt presents to the ED for medical clearance.  Pt is not currently having SI or HI ideations. The patient currently does not have any acute physical complaints and is in no acute distress. Wishes assistance detoxing from heroin and help staying sober. Father is at bedside. Pt was brought in by self seeking voluntary placement/behavioral health assistance.   Labs have resulted. Significantly elevated AST/ALT. Pt does not express any abdominal complaints and physical exam does not reveal any tenderness or hepatomegaly. ACT consult and psych hold orders placed. Pt was appreciated and pt was moved to Psych ED for further evaluation.  Discussed elevated AST/ALT with pt and with Dr. Rosalia Hammers. Ordered Hep C panel. Pt denies abuse of any acetaminophen-containing narcotics such as Norco, Percocet, Tylenol 3. She does state that she has taken heroin laced with fentanyl in the past but does not know how much or how often. Pt states that she takes heroin intravenously daily. ACT will continue to help pt reach her detox goals. Pt is stable for discharge and can be considered medically cleared for detox. However, it was stressed to the patient the importance of following up with her lab work and to have her liver enzymes checked in the next  3-5 days. While this medical concern needs to be address, there is nothing acute or emergent to do today.  Discussed reasons to seek immediate care. Patient expresses understanding and agrees with plan.   Glade Nurse, PA-C 01/01/13 1552  Glade Nurse, PA-C 01/01/13 [phone]

## 2013-01-01 NOTE — ED Notes (Signed)
Pt is trying to get into [facility] in [place] and needs medical clearance.  Pt is detoxing from IV heroin.  Last use this am.    No pain.   NO SI/HI

## 2013-01-01 NOTE — BH Assessment (Signed)
BHH Assessment Progress Note      Attempted to assess pt.  Pt not yet medically cleared.

## 2013-01-02 LAB — HEPATITIS PANEL, ACUTE: Hep A IgM: NEGATIVE

## 2013-01-03 NOTE — ED Provider Notes (Signed)
History/physical exam/procedure(s) were performed by non-physician practitioner and as supervising physician I was immediately available for consultation/collaboration. I have reviewed all notes and am in agreement with care and plan.   Kara Wendorff S Bria Sparr, MD 01/03/13 1110 

## 2013-02-01 ENCOUNTER — Encounter (HOSPITAL_COMMUNITY): Payer: Self-pay | Admitting: Adult Health

## 2013-02-01 DIAGNOSIS — F17200 Nicotine dependence, unspecified, uncomplicated: Secondary | ICD-10-CM | POA: Insufficient documentation

## 2013-02-01 DIAGNOSIS — F1110 Opioid abuse, uncomplicated: Secondary | ICD-10-CM | POA: Insufficient documentation

## 2013-02-01 DIAGNOSIS — Z3202 Encounter for pregnancy test, result negative: Secondary | ICD-10-CM | POA: Insufficient documentation

## 2013-02-01 DIAGNOSIS — Z8659 Personal history of other mental and behavioral disorders: Secondary | ICD-10-CM | POA: Insufficient documentation

## 2013-02-01 DIAGNOSIS — Z87440 Personal history of urinary (tract) infections: Secondary | ICD-10-CM | POA: Insufficient documentation

## 2013-02-01 LAB — CBC
HCT: 39.8 % (ref 36.0–46.0)
MCV: 85 fL (ref 78.0–100.0)
Platelets: 295 10*3/uL (ref 150–400)
RBC: 4.68 MIL/uL (ref 3.87–5.11)
WBC: 7.1 10*3/uL (ref 4.0–10.5)

## 2013-02-01 LAB — POCT PREGNANCY, URINE: Preg Test, Ur: NEGATIVE

## 2013-02-01 LAB — COMPREHENSIVE METABOLIC PANEL
AST: 443 U/L — ABNORMAL HIGH (ref 0–37)
Alkaline Phosphatase: 96 U/L (ref 39–117)
CO2: 30 mEq/L (ref 19–32)
Chloride: 101 mEq/L (ref 96–112)
Creatinine, Ser: 0.78 mg/dL (ref 0.50–1.10)
GFR calc non Af Amer: >90 mL/min (ref >90)
Potassium: 3.4 mEq/L — ABNORMAL LOW (ref 3.5–5.1)
Total Bilirubin: 0.6 mg/dL (ref 0.3–1.2)

## 2013-02-01 LAB — RAPID URINE DRUG SCREEN, HOSP PERFORMED
Amphetamines: NOT DETECTED
Barbiturates: NOT DETECTED
Benzodiazepines: NOT DETECTED
Tetrahydrocannabinol: NOT DETECTED

## 2013-02-01 LAB — SALICYLATE LEVEL: Salicylate Lvl: <2 mg/dL — ABNORMAL LOW (ref 2.8–20.0)

## 2013-02-01 LAB — ACETAMINOPHEN LEVEL: Acetaminophen (Tylenol), Serum: <15 ug/mL (ref 10–30)

## 2013-02-01 NOTE — ED Notes (Signed)
Presents requesting detox from heroin. Last use at 20:30, pt is drowsy, pupils sluggish, pinpoint. States, "I have a bed at [facility] I just need to be cleared medically"

## 2013-02-02 ENCOUNTER — Emergency Department (HOSPITAL_COMMUNITY): Payer: Self-pay

## 2013-02-02 ENCOUNTER — Emergency Department (HOSPITAL_COMMUNITY)
Admission: EM | Admit: 2013-02-02 | Discharge: 2013-02-02 | Disposition: A | Discharge status: Home / Self Care | Payer: Self-pay | Attending: Emergency Medicine | Admitting: Emergency Medicine

## 2013-02-02 DIAGNOSIS — F1110 Opioid abuse, uncomplicated: Secondary | ICD-10-CM

## 2013-02-02 MED ORDER — SODIUM CHLORIDE 0.9 % IV BOLUS (SEPSIS): 1000.0000 mL | Freq: Once | INTRAVENOUS | Status: DC

## 2013-02-02 MED ORDER — CLONIDINE HCL 0.2 MG PO TABS: 0.1000 mg | ORAL_TABLET | Freq: Two times a day (BID) | ORAL | Status: DC

## 2013-02-02 MED ORDER — POTASSIUM CHLORIDE CRYS ER 20 MEQ PO TBCR
20.0000 meq | EXTENDED_RELEASE_TABLET | Freq: Once | ORAL | Status: AC
  Administered 2013-02-02: 20 meq via ORAL
  Filled 2013-02-02: qty 1

## 2013-02-02 NOTE — ED Notes (Signed)
Pt taken to ultrasound with transport. Pt alert and in NAD at time of transport

## 2013-02-02 NOTE — BHH Counselor (Signed)
At the start of shift, pt had been released and had returned to [facility] with her father requesting assistance in the placement of the pt to [facility]. Per shift report, the pt had been declined at [facility], but the father call there to advocate for his daughter to be placed there. Writer called [facility] to verify the information and was told by Raelyn Ensign, RN that an error had incurred and the pt "will be accepted with the pre-screen, last assessment and the labs". Writer contacted [employer] [phone] Marylene Land) and after 2 hrs and 20 min, writer was provided an authorization [id number], lasting 3 days (7/26-7/28). All documents were faxed to [facility]. Denice Bors, AADC 02/02/2013 6:40 PM

## 2013-02-02 NOTE — ED Notes (Signed)
Spoke to ACT team about pt placement, told to assess [facility] plan and then if [facility] does not accept to get ACT team to try [facility] again with updated medical records.

## 2013-02-02 NOTE — BH Assessment (Addendum)
Assessment Note   Kara Walker is an 22 y.o. female who presents to [facility] seeking detox from Heroin.  She reports using a gram or more daily for the last 4-5 months.  She was last detoxed at [facility] a month ago and reports she was discharged on a Friday and was to begin Treatment on Monday, but used over the weekend.  She states that she had the opportunity to stay through the weekend when she could go straight to treatment, but she decided to leave on Friday because she was excited that she'd have two days use.  She states she feels like she is finally to the point where her drug use has really affected her life.  "I feel like I don't have anything anymore.  I've burned all of my bridges.  I've hit rock bottom."  She also reports ocassional cocaine use, approximately once per a month, and her last use was Friday afternoon.  She denies SI, HI, or AVH now or in the last six months.    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 UTI (urinary tract infection)   . IV drug user   . Depression    Axis IV: occupational problems and problems with access to health care services Axis V: 41-50 serious symptoms  Past Medical History:  Past Medical History  Diagnosis Date  . UTI (urinary tract infection)   . IV drug user   . Depression     Past Surgical History  Procedure Laterality Date  . No past surgeries    . Induced abortion      Family History: History reviewed. No pertinent family history.  Social History:  reports that she has been smoking Cigarettes.  She has been smoking about 0.00 packs per day. She does not have any smokeless tobacco history on file. She reports that she uses illicit drugs (Heroin, IV, "Crack" cocaine, and Marijuana) about 7 times per week. She reports that she does not drink alcohol.  Additional Social History:  Alcohol / Drug Use History of alcohol / drug use?:  Yes Substance #1 Name of Substance 1: Heroin - IV 1 - Age of First Use: 18 1 - Amount (size/oz): 1 gram 1 - Frequency: daily 1 - Duration: 4-5 mos 1 - Last Use / Amount: 02/01/13 1800 $40  Substance #2 Name of Substance 2: powder cocaine 2 - Age of First Use: teens 2 - Amount (size/oz): varies 2 - Frequency: once a month 2 - Duration: ongoing 2 - Last Use / Amount: 01/02/13  CIWA: CIWA-Ar BP: 93/53 mmHg Pulse Rate: 79 COWS:    Allergies: No Known Allergies  Home Medications:  (Not in a hospital admission)  OB/GYN Status:  No LMP recorded.  General Assessment Data Location of Assessment: [facility] ED Living Arrangements: Parent Can pt return to current living arrangement?: Yes Admission Status: Voluntary Is patient capable of signing voluntary admission?: Yes Transfer from: Acute Hospital Referral Source: Self/Family/Friend  Education Status Is patient currently in school?: No Highest grade of school patient has completed: 12  Risk to self Suicidal Ideation: No Suicidal Intent: No Is patient at risk for suicide?: No Suicidal Plan?: No Access to Means: No What has been your use of drugs/alcohol within the last 12 months?: ongoing Previous Attempts/Gestures: No Intentional Self Injurious Behavior: None Family Suicide History: No Recent stressful life event(s):  (relapse, "everythign is stressful") Persecutory voices/beliefs?: No  Depression: Yes Depression Symptoms: Feeling worthless/self pity;Guilt;Feeling angry/irritable;Loss of interest in usual pleasures;Isolating;Fatigue Substance abuse history and/or treatment for substance abuse?: Yes Suicide prevention information given to non-admitted patients: Yes  Risk to Others Homicidal Ideation: No Thoughts of Harm to Others: No Current Homicidal Intent: No Current Homicidal Plan: No Access to Homicidal Means: No History of harm to others?: No Assessment of Violence: None Noted Does patient have access to weapons?:  No Criminal Charges Pending?: Yes Describe Pending Criminal Charges: posession of cocaine and drug paraphernalia Does patient have a court date: Yes Court Date: 02/18/13  Psychosis Hallucinations: None noted Delusions: None noted  Mental Status Report Appear/Hygiene: Disheveled Eye Contact: Fair Motor Activity: Freedom of movement Speech: Logical/coherent Level of Consciousness: Drowsy Mood: Depressed Affect: Depressed;Appropriate to circumstance Anxiety Level: None Thought Processes: Relevant;Coherent Judgement: Impaired Orientation: Person;Place;Time;Situation Obsessive Compulsive Thoughts/Behaviors: None  Cognitive Functioning Concentration: Decreased Memory: Recent Impaired;Remote Impaired IQ: Average Insight: Fair Impulse Control: Poor Appetite: Poor Weight Loss: 20 Weight Gain: 0 Sleep: Decreased Total Hours of Sleep: 4 Vegetative Symptoms: Staying in bed  ADLScreening [facility] Assessment Services) Patient's cognitive ability adequate to safely complete daily activities?: Yes Patient able to express need for assistance with ADLs?: Yes Independently performs ADLs?: Yes (appropriate for developmental age)  Abuse/Neglect [facility]) Physical Abuse: Denies Verbal Abuse: Denies Sexual Abuse: Denies  Prior Inpatient Therapy Prior Inpatient Therapy: Yes Prior Therapy Dates: 2010,2011,2012, 2014 Prior Therapy Facilty/Provider(s): [facility], [facility], [facility] Reason for Treatment: Detox/Rehab   Prior Outpatient Therapy Prior Outpatient Therapy: Yes Prior Therapy Dates: 2012 Prior Therapy Facilty/Provider(s): [facility] Reason for Treatment: SA  ADL Screening (condition at time of admission) Patient's cognitive ability adequate to safely complete daily activities?: Yes Patient able to express need for assistance with ADLs?: Yes Independently performs ADLs?: Yes (appropriate for developmental age) Weakness of Legs: None Weakness of Arms/Hands: None  Home Assistive  Devices/Equipment Home Assistive Devices/Equipment: None    Abuse/Neglect Assessment (Assessment to be complete while patient is alone) Physical Abuse: Denies Verbal Abuse: Denies Sexual Abuse: Denies Exploitation of patient/patient's resources: Denies Self-Neglect: Denies Values / Beliefs Cultural Requests During Hospitalization: None Spiritual Requests During Hospitalization: None     Nutrition Screen- [facility] Adult/[facility]/AP Patient's home diet: Regular  Additional Information 1:1 In Past 12 Months?: No CIRT Risk: No Elopement Risk: No Does patient have medical clearance?: Yes     Disposition:  Disposition Initial Assessment Completed for this Encounter: Yes Disposition of Patient: Inpatient treatment program Type of inpatient treatment program: Adult Other disposition(s): Referred to outside facility Patient referred to: [facility]  On Site Evaluation by:  Dierdre Highman Reviewed with Physician:  Patriciaann Clan Marlana Latus 02/02/2013 1:53 AM

## 2013-02-02 NOTE — ED Provider Notes (Signed)
CSN: [id number]     Arrival date & time 02/01/13  2129 History     First MD Initiated Contact with Patient 02/02/13 0021     Chief Complaint  Patient presents with  . 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 Last use 2 hours ago. No N/V/D, F/C or ABD pain. She's previously been to [facility] for the same about a month ago. No chest pain or shortness of breath. She is anxious with symptoms moderate in severity   Past Medical History  Diagnosis Date  . UTI (urinary tract infection)   . IV drug user   . Depression    Past Surgical History  Procedure Laterality Date  . No past surgeries    . Induced abortion     History reviewed. No pertinent family history. History  Substance Use Topics  . Smoking status: Current Some Day Smoker    Types: Cigarettes    Last Attempt to Quit: 04/14/2011  . Smokeless tobacco: Not on file  . Alcohol Use: No   OB History   Grav Para Term Preterm Abortions TAB SAB Ect Mult Living   0              Review of Systems  Constitutional: Negative for fever and chills.  HENT: Negative for neck pain and neck stiffness.   Eyes: Negative for pain.  Respiratory: Negative for shortness of breath.   Cardiovascular: Negative for chest pain.  Gastrointestinal: Negative for abdominal pain.  Genitourinary: Negative for dysuria.  Musculoskeletal: Negative for back pain.  Skin: Negative for rash.  Neurological: Negative for headaches.  All other systems reviewed and are negative.    Allergies  Review of patient's allergies indicates no known allergies.  Home Medications  No current outpatient prescriptions on file. BP 93/53  Pulse 79  Temp(Src) 97.7 F (36.5 C) (Oral)  Resp 16  SpO2 96% Physical Exam  Constitutional: She is oriented to person, place, and time. She appears  well-developed and well-nourished.  HENT:  Head: Normocephalic and atraumatic.  Eyes: EOM are normal. Pupils are equal, round, and reactive to light. No scleral icterus.  Neck: Neck supple.  Cardiovascular: Normal rate, regular rhythm and intact distal pulses.   Pulmonary/Chest: Effort normal and breath sounds normal. No respiratory distress.  Abdominal: Soft. She exhibits no distension. There is no tenderness.  Musculoskeletal: Normal range of motion. She exhibits no edema.  Neurological: She is alert and oriented to person, place, and time.  Skin: Skin is warm and dry.    ED Course   Procedures (including critical care time)  Results for orders placed during the hospital encounter of 02/02/13  ACETAMINOPHEN LEVEL      Result Value Range   Acetaminophen (Tylenol), Serum <15.0  10 - 30 ug/mL  CBC      Result Value Range   WBC 7.1  4.0 - 10.5 K/uL   RBC 4.68  3.87 - 5.11 MIL/uL   Hemoglobin 14.2  12.0 - 15.0 g/dL   HCT 95.6  21.3 - 08.6 %   MCV 85.0  78.0 - 100.0 fL   MCH 30.3  26.0 - 34.0 pg   MCHC 35.7  30.0 - 36.0 g/dL   RDW 57.8  46.9 - 62.9 %   Platelets 295  150 - 400 K/uL  COMPREHENSIVE METABOLIC PANEL      Result Value Range  Sodium 139  135 - 145 mEq/L   Potassium 3.4 (*) 3.5 - 5.1 mEq/L   Chloride 101  96 - 112 mEq/L   CO2 30  19 - 32 mEq/L   Glucose, Bld 112 (*) 70 - 99 mg/dL   BUN 10  6 - 23 mg/dL   Creatinine, Ser 9.60  0.50 - 1.10 mg/dL   Calcium 9.3  8.4 - 45.4 mg/dL   Total Protein 7.0  6.0 - 8.3 g/dL   Albumin 3.2 (*) 3.5 - 5.2 g/dL   AST 098 (*) 0 - 37 U/L   ALT 387 (*) 0 - 35 U/L   Alkaline Phosphatase 96  39 - 117 U/L   Total Bilirubin 0.6  0.3 - 1.2 mg/dL   GFR calc non Af Amer >90  >90 mL/min   GFR calc Af Amer >90  >90 mL/min  ETHANOL      Result Value Range   Alcohol, Ethyl (B) <11  0 - 11 mg/dL  SALICYLATE LEVEL      Result Value Range   Salicylate Lvl <2.0 (*) 2.8 - 20.0 mg/dL  URINE RAPID DRUG SCREEN (HOSP PERFORMED)      Result  Value Range   Opiates POSITIVE (*) NONE DETECTED   Cocaine POSITIVE (*) NONE DETECTED   Benzodiazepines NONE DETECTED  NONE DETECTED   Amphetamines NONE DETECTED  NONE DETECTED   Tetrahydrocannabinol NONE DETECTED  NONE DETECTED   Barbiturates NONE DETECTED  NONE DETECTED  POCT PREGNANCY, URINE      Result Value Range   Preg Test, Ur NEGATIVE  NEGATIVE   US Abdomen Complete  02/02/2013   *RADIOLOGY REPORT*  Clinical Data:  Elevated liver function tests.  COMPLETE ABDOMINAL ULTRASOUND  Comparison:  None.  Findings:  Gallbladder:  The gallbladder is contracted.  This is likely physiologic if the patient is not been fasting.  Wall thickness is borderline enlarged but this is likely due to contraction.  No stones or sludge identified.  Murphy's sign is negative.  Common bile duct:  Normal caliber with measured diameter of 4.4 mm.  Liver:  Normal parenchymal echotexture.  No focal lesion identified.  Limited color flow Doppler images of the main portal vein show flow in the proper direction.  IVC:  Appears normal.  Pancreas:  No focal abnormality seen.  Spleen:  Spleen length measures 10.5 cm.  Normal parenchymal echotexture.  Right Kidney:  Right kidney measures 11.6 cm length.  No hydronephrosis.  Left Kidney:  Left kidney measures 12 cm length.  No hydronephrosis.  Abdominal aorta:  No aneurysm identified.  IMPRESSION: Contracted gallbladder is likely physiologic.  Examination is otherwise unremarkable.   Original Report Authenticated By: Burman Nieves, M.D.    12:28 AM ACT to evaluate.    Labs reviewed. Elevated liver enzymes. Patient denies any alcohol use. She states last time she went [facility] she was evaluated here with elevated liver enzymes and negative hepatitis blood work.   By review of previous records LFTs on 01-01-13 were:  AST 196 (H) ALT 262 (H). No record of previous imaging of her liver. Ultrasound ordered  [facility] contacted and declined patient based on elevated LFTs. Ultrasound reviewed  as above. I had a long discussion with patient regarding need for primary care followup. She is concerned about not having insurance and I offered her referral to the adult care clinic. She seems reluctant but agrees to followup. She did declines GI referral. Prescription for clonidine offered. Outpatient resources and referrals provided  by ACT team. Patient stable for discharge home at this time    MDM  Heroin abuse and withdrawal requesting detox Labs reviewed. Elevated LFTs. Normal Tylenol level, nondrinker, reported recent hepatitis workup negative ACT consulted/ unable to get PT to detox/ outpatient referrals provided  Sunnie Nielsen, MD 02/03/13 [phone]

## 2013-02-02 NOTE — ED Notes (Addendum)
Pt eating at bedside, food provided by pt's family. Spoke to EDP about pt BP and fluids, pt not required at this time to have IV fluids will continue to monitor and update EDP

## 2013-02-02 NOTE — ED Notes (Signed)
Pt called father for ride home.

## 2013-02-02 NOTE — BH Assessment (Signed)
[facility]HH Assessment Progress Note      Spoke to Darlene from [facility].  [name] labs and BP rule her out for admission to their facility.  EDP notified.

## 2013-02-04 LAB — HEPATITIS PANEL, ACUTE
Hep B C IgM: NEGATIVE
Hepatitis B Surface Ag: NEGATIVE

## 2013-02-05 ENCOUNTER — Telehealth (HOSPITAL_COMMUNITY): Payer: Self-pay | Admitting: Emergency Medicine

## 2013-02-05 NOTE — ED Notes (Signed)
+  Hepatitis. Chart sent to EDP office for review. 

## 2013-02-05 NOTE — ED Notes (Addendum)
Patient has +Hepetitis.

## 2013-04-11 ENCOUNTER — Encounter (HOSPITAL_COMMUNITY): Payer: Self-pay | Admitting: Emergency Medicine

## 2013-04-11 ENCOUNTER — Emergency Department (HOSPITAL_COMMUNITY)
Admission: EM | Admit: 2013-04-11 | Discharge: 2013-04-12 | Disposition: A | Discharge status: Home / Self Care | Payer: Self-pay | Attending: Emergency Medicine | Admitting: Emergency Medicine

## 2013-04-11 DIAGNOSIS — F17200 Nicotine dependence, unspecified, uncomplicated: Secondary | ICD-10-CM | POA: Insufficient documentation

## 2013-04-11 DIAGNOSIS — Z87440 Personal history of urinary (tract) infections: Secondary | ICD-10-CM | POA: Insufficient documentation

## 2013-04-11 DIAGNOSIS — F1110 Opioid abuse, uncomplicated: Secondary | ICD-10-CM | POA: Insufficient documentation

## 2013-04-11 DIAGNOSIS — Z8659 Personal history of other mental and behavioral disorders: Secondary | ICD-10-CM | POA: Insufficient documentation

## 2013-04-11 DIAGNOSIS — Z3202 Encounter for pregnancy test, result negative: Secondary | ICD-10-CM | POA: Insufficient documentation

## 2013-04-11 LAB — RAPID URINE DRUG SCREEN, HOSP PERFORMED
Amphetamines: NOT DETECTED
Barbiturates: NOT DETECTED
Benzodiazepines: NOT DETECTED
Cocaine: POSITIVE — AB

## 2013-04-11 LAB — URINALYSIS, ROUTINE W REFLEX MICROSCOPIC
Bilirubin Urine: NEGATIVE
Glucose, UA: NEGATIVE mg/dL
Hgb urine dipstick: NEGATIVE
Ketones, ur: NEGATIVE mg/dL
Specific Gravity, Urine: 1.032 — ABNORMAL HIGH (ref 1.005–1.030)
pH: 5 (ref 5.0–8.0)

## 2013-04-11 LAB — CBC WITH DIFFERENTIAL/PLATELET
Basophils Absolute: 0 10*3/uL (ref 0.0–0.1)
Basophils Relative: 0 % (ref 0–1)
Eosinophils Relative: 2 % (ref 0–5)
HCT: 39 % (ref 36.0–46.0)
Lymphocytes Relative: 46 % (ref 12–46)
MCHC: 34.6 g/dL (ref 30.0–36.0)
Monocytes Absolute: 0.6 10*3/uL (ref 0.1–1.0)
Neutro Abs: 4 10*3/uL (ref 1.7–7.7)
Platelets: 305 10*3/uL (ref 150–400)
RDW: 13 % (ref 11.5–15.5)
WBC: 8.9 10*3/uL (ref 4.0–10.5)

## 2013-04-11 LAB — BASIC METABOLIC PANEL
CO2: 31 mEq/L (ref 19–32)
Calcium: 9.6 mg/dL (ref 8.4–10.5)
Chloride: 102 mEq/L (ref 96–112)
Creatinine, Ser: 0.73 mg/dL (ref 0.50–1.10)
GFR calc Af Amer: >90 mL/min (ref >90)
Sodium: 140 mEq/L (ref 135–145)

## 2013-04-11 LAB — ETHANOL: Alcohol, Ethyl (B): <11 mg/dL (ref 0–11)

## 2013-04-11 LAB — POCT PREGNANCY, URINE: Preg Test, Ur: NEGATIVE

## 2013-04-11 MED ORDER — ONDANSETRON HCL 4 MG PO TABS: 4.0000 mg | ORAL_TABLET | Freq: Three times a day (TID) | ORAL | Status: DC | PRN

## 2013-04-11 MED ORDER — LORAZEPAM 1 MG PO TABS: 1.0000 mg | ORAL_TABLET | Freq: Three times a day (TID) | ORAL | Status: DC | PRN

## 2013-04-11 NOTE — ED Provider Notes (Signed)
Medical screening examination/treatment/procedure(s) were performed by non-physician practitioner and as supervising physician I was immediately available for consultation/collaboration. Devoria Albe, MD, FACEP   Ward Givens, MD 04/11/13 1800

## 2013-04-11 NOTE — ED Provider Notes (Signed)
CSN: [id number]     Arrival date & time 04/11/13  1450 History   First MD Initiated Contact with Patient 04/11/13 1524     Chief Complaint  Patient presents with  . Medical Clearance  This chart was scribed for Fuller [place] by Ladona Ridgel Day, ED scribe. This patient was seen in room [facility] and the patient's care was started at 325 PM.  The history is provided by the patient. No language interpreter was used.   HPI Comments: Kara Walker is a 22 y.o. female who presents to the Emergency Department with IV papers and here under Judge's orders. She admits to using heroin and has last used this AM. She states also uses crack. She denies SI or HI. She denies ETOH use. She denies chest pain, SOB, nausea, emesis, diarrhea, constipation, or pain over her body. She states has "high enzymes". She denies allergies to medicines.    Past Medical History  Diagnosis Date  . UTI (urinary tract infection)   . IV drug user   . Depression    Past Surgical History  Procedure Laterality Date  . No past surgeries    . Induced abortion     No family history on file. History  Substance Use Topics  . Smoking status: Current Some Day Smoker    Types: Cigarettes    Last Attempt to Quit: 04/14/2011  . Smokeless tobacco: Not on file  . Alcohol Use: No   OB History   Grav Para Term Preterm Abortions TAB SAB Ect Mult Living   0              Review of Systems  Constitutional: Negative for fever and chills.  HENT: Negative for congestion.   Respiratory: Negative for shortness of breath.   Cardiovascular: Negative for chest pain.  Gastrointestinal: Negative for nausea and vomiting.  Musculoskeletal: Negative for back pain.  Skin: Negative for color change.  Neurological: Negative for weakness.  Psychiatric/Behavioral: Negative for suicidal ideas and self-injury.       Last used heroin this AM  All other systems reviewed and are negative.  A complete 10 system review of systems was obtained and  all systems are negative except as noted in the HPI and PMH.    Allergies  Review of patient's allergies indicates no known allergies.  Home Medications  No current outpatient prescriptions on file. Triage Vitals: BP 110/64  Pulse 92  Temp(Src) 98.1 F (36.7 C) (Oral)  Resp 16  SpO2 97% Physical Exam  Nursing note and vitals reviewed. Constitutional: She is oriented to person, place, and time. She appears well-developed and well-nourished.  HENT:  Head: Normocephalic.  Mouth/Throat: Oropharynx is clear and moist. No oropharyngeal exudate.  Eyes: EOM are normal.  Neck: Normal range of motion.  Cardiovascular: Normal rate, regular rhythm and normal heart sounds.  Exam reveals no gallop and no friction rub.   No murmur heard. Pulmonary/Chest: Effort normal and breath sounds normal. No respiratory distress. She has no wheezes. She has no rales. She exhibits no tenderness.  Abdominal: Soft. She exhibits no distension and no mass. There is no tenderness. There is no rebound and no guarding.  Musculoskeletal: Normal range of motion.  Neurological: She is alert and oriented to person, place, and time.  Skin: Skin is warm and dry.  Psychiatric:  Withdrawn    ED Course  Procedures (including critical care time) DIAGNOSTIC STUDIES: Oxygen Saturation is 99% on room air, normal by my interpretation.    COORDINATION OF  CARE: At 330 PM Discussed treatment plan with patient which includes UA/UD, blood work, ETOH level. Patient agrees.   Labs Review Labs Reviewed  URINALYSIS, ROUTINE W REFLEX MICROSCOPIC  URINE RAPID DRUG SCREEN (HOSP PERFORMED)  CBC WITH DIFFERENTIAL  BASIC METABOLIC PANEL  ETHANOL  POCT PREGNANCY, URINE   Results for orders placed during the hospital encounter of 04/11/13  URINALYSIS, ROUTINE W REFLEX MICROSCOPIC      Result Value Range   Color, Urine YELLOW  YELLOW   APPearance CLEAR  CLEAR   Specific Gravity, Urine 1.032 (*) 1.005 - 1.030   pH 5.0  5.0 -  8.0   Glucose, UA NEGATIVE  NEGATIVE mg/dL   Hgb urine dipstick NEGATIVE  NEGATIVE   Bilirubin Urine NEGATIVE  NEGATIVE   Ketones, ur NEGATIVE  NEGATIVE mg/dL   Protein, ur NEGATIVE  NEGATIVE mg/dL   Urobilinogen, UA 0.2  0.0 - 1.0 mg/dL   Nitrite NEGATIVE  NEGATIVE   Leukocytes, UA NEGATIVE  NEGATIVE  URINE RAPID DRUG SCREEN (HOSP PERFORMED)      Result Value Range   Opiates POSITIVE (*) NONE DETECTED   Cocaine POSITIVE (*) NONE DETECTED   Benzodiazepines NONE DETECTED  NONE DETECTED   Amphetamines NONE DETECTED  NONE DETECTED   Tetrahydrocannabinol NONE DETECTED  NONE DETECTED   Barbiturates NONE DETECTED  NONE DETECTED  CBC WITH DIFFERENTIAL      Result Value Range   WBC 8.9  4.0 - 10.5 K/uL   RBC 4.55  3.87 - 5.11 MIL/uL   Hemoglobin 13.5  12.0 - 15.0 g/dL   HCT 16.1  09.6 - 04.5 %   MCV 85.7  78.0 - 100.0 fL   MCH 29.7  26.0 - 34.0 pg   MCHC 34.6  30.0 - 36.0 g/dL   RDW 40.9  81.1 - 91.4 %   Platelets 305  150 - 400 K/uL   Neutrophils Relative % 44  43 - 77 %   Neutro Abs 4.0  1.7 - 7.7 K/uL   Lymphocytes Relative 46  12 - 46 %   Lymphs Abs 4.1 (*) 0.7 - 4.0 K/uL   Monocytes Relative 7  3 - 12 %   Monocytes Absolute 0.6  0.1 - 1.0 K/uL   Eosinophils Relative 2  0 - 5 %   Eosinophils Absolute 0.2  0.0 - 0.7 K/uL   Basophils Relative 0  0 - 1 %   Basophils Absolute 0.0  0.0 - 0.1 K/uL  BASIC METABOLIC PANEL      Result Value Range   Sodium 140  135 - 145 mEq/L   Potassium 3.9  3.5 - 5.1 mEq/L   Chloride 102  96 - 112 mEq/L   CO2 31  19 - 32 mEq/L   Glucose, Bld 70  70 - 99 mg/dL   BUN 8  6 - 23 mg/dL   Creatinine, Ser 7.82  0.50 - 1.10 mg/dL   Calcium 9.6  8.4 - 95.6 mg/dL   GFR calc non Af Amer >90  >90 mL/min   GFR calc Af Amer >90  >90 mL/min  POCT PREGNANCY, URINE      Result Value Range   Preg Test, Ur NEGATIVE  NEGATIVE       MDM  No diagnosis found.  Patient IVD'd for detox from heroin.  Patient denies SI/HI.  She is medically clear. Will move to  psych ED and place psych hold orders.  [facility] consult is pending.  Roxy Horseman, PA-C 04/11/13 1701

## 2013-04-11 NOTE — BH Assessment (Signed)
LCSW attempted to [facility] assessment: Patient approached at 8:30 PM and requested with closed eyes that LCSW return to speak with her "in a little while" as she was not feeling well. LCSW stopped in briefly at 9:45 and patient was heard softly snoring.  Carney Bern, LCSWA

## 2013-04-11 NOTE — ED Notes (Signed)
Per pt, here because "judge is making her"-daily heroin user-last use today-guarded, not willing to answer questions

## 2013-04-11 NOTE — ED Notes (Signed)
When trying to do a psychiatric assessment pt decided she wasn't going to speak with me

## 2013-04-11 NOTE — ED Notes (Signed)
Pt here for heroin detox.  A/Ox3.  VSS. Positive for opiates and cocaine.  Awaiting psych assessment.

## 2013-04-12 NOTE — Progress Notes (Signed)
[facility] CL did not get to see patient but will be sending information about the [facility] [employer] Card, using the address provided.  °

## 2013-04-12 NOTE — B[facility] Counselor (Signed)
This Clinical research associate contacted [facility], per Dennie Bible, no [place] beds avail.  Placement will need to be pursued at other facilities.

## 2013-04-12 NOTE — BH Assessment (Signed)
Assessment Note  Kara Walker is a 22 y.o. female who presents to [facility] for detox from Heroin and Cocaine.  Pt denies SI/HI/Psych. Pt consumes 1/2 gram of Heroin, daily, last use was 04/11/13.  Pt admits using Cocaine, 2x's a week. Pt has had 6 past admissions with [facility] for detox/rehab(2012.2013,2014).  Pt says she was brought to [facility] by Vesta Mixer at the request of the court, she has been mandated to pursue detox.  Pt has a court date 04/16/13; has a possession charge and served 8 days.  Pt denies any current seizures/blackouts, no w/d sxs.  Pt shoots in bilateral arms. Pt is irritable during interview and unable to answer some questions with clarity.  Pt has is coherent but has slurred speech at times.    Axis I: Opioid Dependence, Cocaine Abuse Axis II: Deferred Axis III:  Past Medical History  Diagnosis Date  . UTI (urinary tract infection)   . IV drug user   . Depression    Axis IV: other psychosocial or environmental problems, problems related to legal system/crime, problems related to social environment and problems with primary support group Axis V: 51-60 moderate symptoms  Past Medical History:  Past Medical History  Diagnosis Date  . UTI (urinary tract infection)   . IV drug user   . Depression     Past Surgical History  Procedure Laterality Date  . No past surgeries    . Induced abortion      Family History: No family history on file.  Social History:  reports that she has been smoking Cigarettes.  She has been smoking about 0.00 packs per day. She does not have any smokeless tobacco history on file. She reports that she uses illicit drugs (Heroin, IV, "Crack" cocaine, and Marijuana) about 7 times per week. She reports that she does not drink alcohol.  Additional Social History:  Alcohol / Drug Use Pain Medications: None  Prescriptions: None  Over the Counter: None  History of alcohol / drug use?: Yes Longest period of sobriety (when/how long): Only when in dedtox   Negative Consequences of Use: Legal;Work / School;Personal relationships;Financial Withdrawal Symptoms: Other (Comment) (No current w/d sxs ) Substance #1 Name of Substance 1: Heroin  1 - Age of First Use: Teens  1 - Amount (size/oz): 1/2 Gram  1 - Frequency: Daily  1 - Duration: On-going  1 - Last Use / Amount: 04/11/13 Substance #2 Name of Substance 2: Cocaine  2 - Age of First Use: Teens  2 - Amount (size/oz): Unk--"I don't know"  2 - Frequency: 2x's  2 - Duration: On-going  2 - Last Use / Amount: 04/11/13  CIWA: CIWA-Ar BP: 111/62 mmHg Pulse Rate: 92 COWS:    Allergies: No Known Allergies  Home Medications:  (Not in a hospital admission)  OB/GYN Status:  No LMP recorded. Patient is not currently having periods (Reason: Other).  General Assessment Data Location of Assessment: [facility] Is this a Tele or Face-to-Face Assessment?: Tele Assessment Is this an Initial Assessment or a Re-assessment for this encounter?: Initial Assessment Living Arrangements: Parent (Lives with parents ) Can pt return to current living arrangement?: Yes Admission Status: Voluntary Is patient capable of signing voluntary admission?: Yes Transfer from: Acute Hospital Referral Source: MD  Medical Screening Exam [facility] Walk-in ONLY) Medical Exam completed: No Reason for MSE not completed: Other: (None )  [facility] Crisis Care Plan Living Arrangements: Parent (Lives with parents ) Name of Psychiatrist: None  Name of Therapist: None  Education Status Is patient currently in school?: No Current Grade: None  Highest grade of school patient has completed: None  Name of school: None  Contact person: None   Risk to self Suicidal Ideation: No Suicidal Intent: No Is patient at risk for suicide?: No Suicidal Plan?: No Access to Means: No What has been your use of drugs/alcohol within the last 12 months?: Abusing: heroin, cocaine  Previous Attempts/Gestures: No How many times?: 0 Other Self Harm  Risks: None  Triggers for Past Attempts: None known Intentional Self Injurious Behavior: None Family Suicide History: No Recent stressful life event(s): Other (Comment);Legal Issues (Chronic SA ) Persecutory voices/beliefs?: No Depression: No Depression Symptoms:  (None reported ) Substance abuse history and/or treatment for substance abuse?: Yes Suicide prevention information given to non-admitted patients: Not applicable  Risk to Others Homicidal Ideation: No Thoughts of Harm to Others: No Current Homicidal Intent: No Current Homicidal Plan: No Access to Homicidal Means: No Identified Victim: None  History of harm to others?: No Assessment of Violence: None Noted Violent Behavior Description: None  Does patient have access to weapons?: No Criminal Charges Pending?: Yes Describe Pending Criminal Charges: Possession of Heroin  Does patient have a court date: Yes Court Date: 04/16/13  Psychosis Hallucinations: None noted Delusions: None noted  Mental Status Report Appear/Hygiene: Disheveled;Poor hygiene Eye Contact: Fair Motor Activity: Unremarkable Speech: Logical/coherent;Slurred Level of Consciousness: Alert Mood: Apathetic Affect: Apathetic Anxiety Level: None Thought Processes: Relevant;Coherent Judgement: Unimpaired Orientation: Person;Place;Time;Situation Obsessive Compulsive Thoughts/Behaviors: None  Cognitive Functioning Concentration: Normal Memory: Recent Intact;Remote Intact IQ: Average Insight: Poor Impulse Control: Fair Appetite: Fair Weight Loss: 0 Weight Gain: 0 Sleep: No Change Total Hours of Sleep: 6 Vegetative Symptoms: None  ADLScreening [facility] Assessment Services) Patient's cognitive ability adequate to safely complete daily activities?: Yes Patient able to express need for assistance with ADLs?: Yes Independently performs ADLs?: Yes (appropriate for developmental age)  Prior Inpatient Therapy Prior Inpatient Therapy: Yes Prior  Therapy Dates: 2012,2013,2014 Prior Therapy Facilty/Provider(s): [facility]  Reason for Treatment: Detox/Rehab   Prior Outpatient Therapy Prior Outpatient Therapy: No Prior Therapy Dates: None  Prior Therapy Facilty/Provider(s): None  Reason for Treatment: None   ADL Screening (condition at time of admission) Patient's cognitive ability adequate to safely complete daily activities?: Yes Is the patient deaf or have difficulty hearing?: No Does the patient have difficulty seeing, even when wearing glasses/contacts?: No Does the patient have difficulty concentrating, remembering, or making decisions?: No Patient able to express need for assistance with ADLs?: Yes Does the patient have difficulty dressing or bathing?: No Independently performs ADLs?: Yes (appropriate for developmental age) Does the patient have difficulty walking or climbing stairs?: No Weakness of Legs: None Weakness of Arms/Hands: None  Home Assistive Devices/Equipment Home Assistive Devices/Equipment: None  Therapy Consults (therapy consults require a physician order) PT Evaluation Needed: No OT Evalulation Needed: No SLP Evaluation Needed: No Abuse/Neglect Assessment (Assessment to be complete while patient is alone) Physical Abuse: Denies Verbal Abuse: Denies Sexual Abuse: Denies Exploitation of patient/patient's resources: Denies Self-Neglect: Denies Values / Beliefs Cultural Requests During Hospitalization: None Spiritual Requests During Hospitalization: None Consults Spiritual Care Consult Needed: No Social Work Consult Needed: No Merchant [employer] officer (For Healthcare) Advance Directive: Patient does not have advance directive;Patient would not like information Pre-existing out of facility DNR order (yellow form or pink MOST form): No Nutrition Screen- [facility] Patient's home diet: Regular  Additional Information 1:1 In Past 12 Months?: No CIRT Risk: No Elopement Risk: No Does patient have  medical  clearance?: Yes     Disposition:  Disposition Initial Assessment Completed for this Encounter: Yes Disposition of Patient: Inpatient treatment program;Referred to [facility]) Type of inpatient treatment program: Adult Patient referred to: Other (Comment) [facility] )  On Site Evaluation by:   Reviewed with Physician:    Murrell Redden 04/12/2013 3:52 AM

## 2013-04-12 NOTE — ED Notes (Signed)
Pt is awake and alert, pleasant and cooperative. Patient denies HI, SI AH or VH. Discharge vitals 128/89 HR 88 RR 16 and unlabored. Pt has outpatient treatment scheduled. Will continue to monitor for safety. Patient escorted to lobby without incident. T.Melvyn Neth RN

## 2013-04-12 NOTE — ED Provider Notes (Signed)
Currently no inpt detox.  Pt d/ced home with outpt resources  Gwyneth Sprout, MD 04/12/13 [phone]

## 2013-06-20 ENCOUNTER — Emergency Department (HOSPITAL_BASED_OUTPATIENT_CLINIC_OR_DEPARTMENT_OTHER)
Admission: EM | Admit: 2013-06-20 | Discharge: 2013-06-21 | Disposition: A | Discharge status: Home / Self Care | Payer: Self-pay | Attending: Emergency Medicine | Admitting: Emergency Medicine

## 2013-06-20 ENCOUNTER — Encounter (HOSPITAL_BASED_OUTPATIENT_CLINIC_OR_DEPARTMENT_OTHER): Payer: Self-pay | Admitting: Emergency Medicine

## 2013-06-20 DIAGNOSIS — Z3202 Encounter for pregnancy test, result negative: Secondary | ICD-10-CM | POA: Insufficient documentation

## 2013-06-20 DIAGNOSIS — F17200 Nicotine dependence, unspecified, uncomplicated: Secondary | ICD-10-CM | POA: Insufficient documentation

## 2013-06-20 DIAGNOSIS — N390 Urinary tract infection, site not specified: Secondary | ICD-10-CM | POA: Insufficient documentation

## 2013-06-20 DIAGNOSIS — Z8659 Personal history of other mental and behavioral disorders: Secondary | ICD-10-CM | POA: Insufficient documentation

## 2013-06-20 NOTE — ED Notes (Signed)
Pain after urination x2 days and "strong" odor.

## 2013-06-20 NOTE — ED Provider Notes (Signed)
CSN: [id number]     Arrival date & time 06/20/13  2334 History  This chart was scribed for Hanley Seamen, MD by Carl Best, ED Scribe. This patient was seen in room MH07/MH07 and the patient's care was started at 11:56 PM.     Chief Complaint  Patient presents with  . Dysuria    Patient is a 22 y.o. female presenting with dysuria. The history is provided by the patient. No language interpreter was used.  Dysuria  HPI Comments: Kara Walker is a 22 y.o. female who presents to the Emergency Department complaining of moderate dysuria that started two days ago.  The patient describes the pain as pressure at the end of urinating.  She lists urinary urgency as an associated symptom but states that she has been avoiding urination due to discomfort.  She denies fever, chills, abdominal pain, nausea, emesis, hematuria, vaginal bleeding, and vaginal discharge as associated symptoms. She notes her urine to have a strong odor.  Past Medical History  Diagnosis Date  . UTI (urinary tract infection)   . IV drug user   . Depression    Past Surgical History  Procedure Laterality Date  . No past surgeries    . Induced abortion     No family history on file. History  Substance Use Topics  . Smoking status: Current Some Day Smoker    Types: Cigarettes    Last Attempt to Quit: 04/14/2011  . Smokeless tobacco: Not on file  . Alcohol Use: No   OB History   Grav Para Term Preterm Abortions TAB SAB Ect Mult Living   0              Review of Systems  Genitourinary: Positive for dysuria.  All other systems reviewed and are negative.    Allergies  Review of patient's allergies indicates no known allergies.  Home Medications  No current outpatient prescriptions on file.  Triage Vitals: BP 109/69  Pulse 94  Temp(Src) 98.2 F (36.8 C) (Oral)  Resp 16  Ht 5\' 3"  (1.6 m)  Wt 130 lb (58.968 kg)  BMI 23.03 kg/m2  SpO2 99%  LMP 06/11/2013  Physical Exam  Nursing note and vitals  reviewed. Constitutional: She is oriented to person, place, and time. She appears well-developed and well-nourished. No distress.  HENT:  Head: Normocephalic and atraumatic.  Mouth/Throat: Oropharynx is clear and moist. No oropharyngeal exudate.  Eyes: EOM are normal. Pupils are equal, round, and reactive to light.  Neck: Normal range of motion. Neck supple. No tracheal deviation present.  Cardiovascular: Normal rate, regular rhythm and intact distal pulses.  Exam reveals no gallop and no friction rub.   No murmur heard. Pulmonary/Chest: Effort normal and breath sounds normal. No respiratory distress.  Abdominal: Soft. Bowel sounds are normal. She exhibits no distension and no mass. There is no tenderness. There is no rebound.  Genitourinary:  No CVA tenderness.  Musculoskeletal: Normal range of motion. She exhibits no edema.  Neurological: She is alert and oriented to person, place, and time.  Skin: Skin is warm and dry.  Psychiatric: She has a normal mood and affect. Her behavior is normal.    ED Course  Procedures (including critical care time)  DIAGNOSTIC STUDIES: Oxygen Saturation is 99% on room air, normal by my interpretation.    COORDINATION OF CARE: 11:59 PM- Discussed obtaining a UA in the ED with the patient and the patient agreed to the treatment plan.   MDM  Nursing notes  and vitals signs, including pulse oximetry, reviewed.  Summary of this visit's results, reviewed by myself:  Labs:  Results for orders placed during the hospital encounter of 06/20/13 (from the past 24 hour(s))  URINALYSIS, ROUTINE W REFLEX MICROSCOPIC     Status: Abnormal   Collection Time    06/20/13 11:50 PM      Result Value Range   Color, Urine YELLOW  YELLOW   APPearance CLOUDY (*) CLEAR   Specific Gravity, Urine 1.030  1.005 - 1.030   pH 5.5  5.0 - 8.0   Glucose, UA NEGATIVE  NEGATIVE mg/dL   Hgb urine dipstick NEGATIVE  NEGATIVE   Bilirubin Urine NEGATIVE  NEGATIVE   Ketones, ur  NEGATIVE  NEGATIVE mg/dL   Protein, ur NEGATIVE  NEGATIVE mg/dL   Urobilinogen, UA 1.0  0.0 - 1.0 mg/dL   Nitrite NEGATIVE  NEGATIVE   Leukocytes, UA SMALL (*) NEGATIVE  PREGNANCY, URINE     Status: None   Collection Time    06/20/13 11:50 PM      Result Value Range   Preg Test, Ur NEGATIVE  NEGATIVE  URINE MICROSCOPIC-ADD ON     Status: Abnormal   Collection Time    06/20/13 11:50 PM      Result Value Range   Squamous Epithelial / LPF FEW (*) RARE   WBC, UA 7-10  <3 WBC/hpf   Bacteria, UA MANY (*) RARE   Urine-Other MUCOUS PRESENT        I personally performed the services described in this documentation, which was scribed in my presence.  The recorded information has been reviewed and is accurate.   Hanley Seamen, MD 06/21/13 [phone]

## 2013-06-21 LAB — PREGNANCY, URINE: Preg Test, Ur: NEGATIVE

## 2013-06-21 LAB — URINALYSIS, ROUTINE W REFLEX MICROSCOPIC
Bilirubin Urine: NEGATIVE
Glucose, UA: NEGATIVE mg/dL
Hgb urine dipstick: NEGATIVE
Ketones, ur: NEGATIVE mg/dL
Protein, ur: NEGATIVE mg/dL
Specific Gravity, Urine: 1.03 (ref 1.005–1.030)

## 2013-06-21 LAB — URINE MICROSCOPIC-ADD ON

## 2013-06-21 MED ORDER — PHENAZOPYRIDINE HCL 200 MG PO TABS: 200.0000 mg | ORAL_TABLET | Freq: Three times a day (TID) | ORAL | Status: DC

## 2013-06-21 MED ORDER — NITROFURANTOIN MONOHYD MACRO 100 MG PO CAPS
100.0000 mg | ORAL_CAPSULE | Freq: Once | ORAL | Status: AC
  Administered 2013-06-21: 100 mg via ORAL
  Filled 2013-06-21: qty 1

## 2013-06-21 MED ORDER — PHENAZOPYRIDINE HCL 100 MG PO TABS
200.0000 mg | ORAL_TABLET | Freq: Once | ORAL | Status: AC
  Administered 2013-06-21: 200 mg via ORAL
  Filled 2013-06-21: qty 2

## 2013-06-21 MED ORDER — NITROFURANTOIN MONOHYD MACRO 100 MG PO CAPS: 100.0000 mg | ORAL_CAPSULE | Freq: Two times a day (BID) | ORAL | Status: DC

## 2013-06-24 LAB — URINE CULTURE: Colony Count: >=100000

## 2013-06-25 NOTE — ED Notes (Signed)
Post ED Visit - Positive Culture Follow-up: Successful Patient Follow-Up  Culture assessed and recommendations reviewed by: []  Wes Dulaney, Pharm.D., BCPS [x]  Celedonio Miyamoto, Pharm.D., BCPS []  Georgina Pillion, [address].D., BCPS []  [place], [place].D., BCPS, AAHIVP []  Estella Husk, Pharm.D., BCPS, AAHIVP  Positive urine culture  []  Patient discharged without antimicrobial prescription and treatment is now indicated [x]  Organism is resistant to prescribed ED discharge antimicrobial []  Patient with positive blood cultures  Changes discussed with ED provider:Tayana Kirchenko New antibiotic prescription Cipro-250 mg po TID X 3 days      Larena Sox 06/25/2013, 10:36 PM

## 2013-06-27 ENCOUNTER — Telehealth (HOSPITAL_COMMUNITY): Payer: Self-pay | Admitting: *Deleted

## 2013-07-01 NOTE — ED Notes (Signed)
Unable to contact via phone letter sent to [facility] address. 

## 2013-07-25 ENCOUNTER — Emergency Department (HOSPITAL_COMMUNITY)
Admission: EM | Admit: 2013-07-25 | Discharge: 2013-07-25 | Disposition: A | Discharge status: Home / Self Care | Payer: Self-pay | Attending: Emergency Medicine | Admitting: Emergency Medicine

## 2013-07-25 ENCOUNTER — Encounter (HOSPITAL_COMMUNITY): Payer: Self-pay | Admitting: Emergency Medicine

## 2013-07-25 DIAGNOSIS — F3289 Other specified depressive episodes: Secondary | ICD-10-CM | POA: Insufficient documentation

## 2013-07-25 DIAGNOSIS — K5900 Constipation, unspecified: Secondary | ICD-10-CM | POA: Insufficient documentation

## 2013-07-25 DIAGNOSIS — R300 Dysuria: Secondary | ICD-10-CM | POA: Insufficient documentation

## 2013-07-25 DIAGNOSIS — F1910 Other psychoactive substance abuse, uncomplicated: Secondary | ICD-10-CM | POA: Insufficient documentation

## 2013-07-25 DIAGNOSIS — F17200 Nicotine dependence, unspecified, uncomplicated: Secondary | ICD-10-CM | POA: Insufficient documentation

## 2013-07-25 DIAGNOSIS — N390 Urinary tract infection, site not specified: Secondary | ICD-10-CM | POA: Insufficient documentation

## 2013-07-25 DIAGNOSIS — F329 Major depressive disorder, single episode, unspecified: Secondary | ICD-10-CM | POA: Insufficient documentation

## 2013-07-25 DIAGNOSIS — Z3202 Encounter for pregnancy test, result negative: Secondary | ICD-10-CM | POA: Insufficient documentation

## 2013-07-25 LAB — URINALYSIS, ROUTINE W REFLEX MICROSCOPIC
BILIRUBIN URINE: NEGATIVE
Glucose, UA: NEGATIVE mg/dL
HGB URINE DIPSTICK: NEGATIVE
Ketones, ur: NEGATIVE mg/dL
Leukocytes, UA: NEGATIVE
Nitrite: NEGATIVE
Protein, ur: NEGATIVE mg/dL
Specific Gravity, Urine: 1.029 (ref 1.005–1.030)
UROBILINOGEN UA: 1 mg/dL (ref 0.0–1.0)
pH: 6 (ref 5.0–8.0)

## 2013-07-25 LAB — POCT PREGNANCY, URINE: Preg Test, Ur: NEGATIVE

## 2013-07-25 MED ORDER — IBUPROFEN 800 MG PO TABS: 800.0000 mg | ORAL_TABLET | Freq: Three times a day (TID) | ORAL | Status: DC

## 2013-07-25 MED ORDER — PHENAZOPYRIDINE HCL 200 MG PO TABS: 200.0000 mg | ORAL_TABLET | Freq: Three times a day (TID) | ORAL | Status: DC | PRN

## 2013-07-25 MED ORDER — IBUPROFEN 800 MG PO TABS
800.0000 mg | ORAL_TABLET | Freq: Once | ORAL | Status: AC
  Administered 2013-07-25: 800 mg via ORAL
  Filled 2013-07-25: qty 1

## 2013-07-25 MED ORDER — POLYETHYLENE GLYCOL 3350 17 GM/SCOOP PO POWD: 17.0000 g | Freq: Every day | ORAL | Status: DC

## 2013-07-25 MED ORDER — PHENAZOPYRIDINE HCL 200 MG PO TABS
200.0000 mg | ORAL_TABLET | Freq: Three times a day (TID) | ORAL | Status: DC
  Administered 2013-07-25: 200 mg via ORAL
  Filled 2013-07-25: qty 1

## 2013-07-25 NOTE — ED Provider Notes (Signed)
Medical screening examination/treatment/procedure(s) were performed by non-physician practitioner and as supervising physician I was immediately available for consultation/collaboration.    Marquinn Meschke M Eileen Kangas, MD 07/25/13 0656 

## 2013-07-25 NOTE — ED Notes (Signed)
Pt states she is having urinary tract infection symptoms  [facility] same  Pt states she has recurrent UTIs  Pt is c/o lower abd/pelvic pressure   Pt states she has not had a BM for the past 2 days  Pt states she lives at the [facility] so she cannot receive any narcotic medication

## 2013-07-25 NOTE — ED Provider Notes (Signed)
CSN: [id number]     Arrival date & time 07/25/13  0139 History   First MD Initiated Contact with Patient 07/25/13 0236     Chief Complaint  Patient presents with  . Urinary Frequency   HPI   history provided by the patient. Patient is a 23 year old female with history of IV drug use, UTIs and depression who presents with concerns for a UTI. Patient states that she felt pressure and discomfort after urinating earlier in the day. This happened again shortly after and patient is felt some slight frequency and urgency. Symptoms for someone of previous UTI infections. Patient reports being treated for a UTI last month and seemed to feel better after taking antibiotics. Patient reports that she was recently released from prison 2 months ago. She has not been socially active since that time. She denies any vaginal bleeding or vaginal discharge. Last normal menstrual cycle was January 7. Denies any other aggravating or alleviating factors. No other associated symptoms. No fever, chills or sweats. No flank pain. No nausea or vomiting.    Past Medical History  Diagnosis Date  . UTI (urinary tract infection)   . IV drug user   . Depression    Past Surgical History  Procedure Laterality Date  . No past surgeries    . Induced abortion     History reviewed. No pertinent family history. History  Substance Use Topics  . Smoking status: Current Some Day Smoker    Types: Cigarettes    Last Attempt to Quit: 04/14/2011  . Smokeless tobacco: Not on file  . Alcohol Use: No   OB History   Grav Para Term Preterm Abortions TAB SAB Ect Mult Living   0              Review of Systems  Constitutional: Negative for fever, chills and diaphoresis.  Gastrointestinal: Negative for nausea and vomiting.  Genitourinary: Positive for dysuria and frequency. Negative for hematuria, flank pain, vaginal bleeding and vaginal discharge.  All other systems reviewed and are negative.    Allergies  Review of  patient's allergies indicates no known allergies.  Home Medications   Current Outpatient Rx  Name  Route  Sig  Dispense  Refill  . ibuprofen (ADVIL,MOTRIN) 800 MG tablet   Oral   Take 1 tablet (800 mg total) by mouth 3 (three) times daily.   21 tablet   0   . phenazopyridine (PYRIDIUM) 200 MG tablet   Oral   Take 1 tablet (200 mg total) by mouth 3 (three) times daily as needed for pain.   6 tablet   0   . polyethylene glycol powder (GLYCOLAX/MIRALAX) powder   Oral   Take 17 g by mouth daily.   255 g   0    BP 98/63  Pulse 69  Temp(Src) 98.3 F (36.8 C) (Oral)  Resp 18  SpO2 100%  LMP 07/17/2013 Physical Exam  Nursing note and vitals reviewed. Constitutional: She is oriented to person, place, and time. She appears well-developed and well-nourished. No distress.  HENT:  Head: Normocephalic.  Cardiovascular: Normal rate and regular rhythm.   Pulmonary/Chest: Effort normal and breath sounds normal. No respiratory distress. She has no wheezes.  Abdominal: Soft. There is no tenderness. There is no rebound and no guarding.  No CVA tenderness  Neurological: She is alert and oriented to person, place, and time.  Skin: Skin is warm and dry. No rash noted.  Psychiatric: She has a normal mood and affect. Her  behavior is normal.    ED Course  Procedures   Patient seen and evaluated. Patient appears well comfortable in no acute distress. Abdomen is soft and benign on exam. UA unremarkable with no sign of UTI. Patient without any other vaginal complaints. She has had constipation for 2 days. Discussed treatment plan with patient who agrees. Strict return precautions given.   Results for orders placed during the hospital encounter of 07/25/13  URINALYSIS, ROUTINE W REFLEX MICROSCOPIC      Result Value Range   Color, Urine YELLOW  YELLOW   APPearance CLEAR  CLEAR   Specific Gravity, Urine 1.029  1.005 - 1.030   pH 6.0  5.0 - 8.0   Glucose, UA NEGATIVE  NEGATIVE mg/dL   Hgb  urine dipstick NEGATIVE  NEGATIVE   Bilirubin Urine NEGATIVE  NEGATIVE   Ketones, ur NEGATIVE  NEGATIVE mg/dL   Protein, ur NEGATIVE  NEGATIVE mg/dL   Urobilinogen, UA 1.0  0.0 - 1.0 mg/dL   Nitrite NEGATIVE  NEGATIVE   Leukocytes, UA NEGATIVE  NEGATIVE  POCT PREGNANCY, URINE      Result Value Range   Preg Test, Ur NEGATIVE  NEGATIVE    MDM   1. Dysuria   2. Constipation        Angus Sellereter S Jacklynn Dehaas, PA-C 07/25/13 [phone]

## 2013-07-25 NOTE — Discharge Instructions (Signed)
Your urine test did not show any signs of infection today. Please use medications to help with your symptoms of pressure and help with your constipation. Drink plenty of water to stay hydrated. Followup with the primary care provider for continued evaluation and treatment.    Constipation, Adult Constipation is when a person has fewer than 3 bowel movements a week; has difficulty having a bowel movement; or has stools that are dry, hard, or larger than normal. As people grow older, constipation is more common. If you try to fix constipation with medicines that make you have a bowel movement (laxatives), the problem may get worse. Long-term laxative use may cause the muscles of the colon to become weak. A low-fiber diet, not taking in enough fluids, and taking certain medicines may make constipation worse. CAUSES   Certain medicines, such as antidepressants, pain medicine, iron supplements, antacids, and water pills.   Certain diseases, such as diabetes, irritable bowel syndrome (IBS), thyroid disease, or depression.   Not drinking enough water.   Not eating enough fiber-rich foods.   Stress or travel.  Lack of physical activity or exercise.  Not going to the restroom when there is the urge to have a bowel movement.  Ignoring the urge to have a bowel movement.  Using laxatives too much. SYMPTOMS   Having fewer than 3 bowel movements a week.   Straining to have a bowel movement.   Having hard, dry, or larger than normal stools.   Feeling full or bloated.   Pain in the lower abdomen.  Not feeling relief after having a bowel movement. DIAGNOSIS  Your caregiver will take a medical history and perform a physical exam. Further testing may be done for severe constipation. Some tests may include:   A barium enema X-ray to examine your rectum, colon, and sometimes, your small intestine.  A sigmoidoscopy to examine your lower colon.  A colonoscopy to examine your entire  colon. TREATMENT  Treatment will depend on the severity of your constipation and what is causing it. Some dietary treatments include drinking more fluids and eating more fiber-rich foods. Lifestyle treatments may include regular exercise. If these diet and lifestyle recommendations do not help, your caregiver may recommend taking over-the-counter laxative medicines to help you have bowel movements. Prescription medicines may be prescribed if over-the-counter medicines do not work.  HOME CARE INSTRUCTIONS   Increase dietary fiber in your diet, such as fruits, vegetables, whole grains, and beans. Limit high-fat and processed sugars in your diet, such as [place] fries, hamburgers, cookies, candies, and soda.   A fiber supplement may be added to your diet if you cannot get enough fiber from foods.   Drink enough fluids to keep your urine clear or pale yellow.   Exercise regularly or as directed by your caregiver.   Go to the restroom when you have the urge to go. Do not hold it.  Only take medicines as directed by your caregiver. Do not take other medicines for constipation without talking to your caregiver first. SEEK IMMEDIATE MEDICAL CARE IF:   You have bright red blood in your stool.   Your constipation lasts for more than 4 days or gets worse.   You have abdominal or rectal pain.   You have thin, pencil-like stools.  You have unexplained weight loss. MAKE SURE YOU:   Understand these instructions.  Will watch your condition.  Will get help right away if you are not doing well or get worse. Document Released: 03/25/2004 Document  Revised: 09/19/2011 Document Reviewed: 04/08/2013 [facility] Patient Information 2014 [place], [place].    Dysuria Dysuria is the medical term for pain with urination. There are many causes for dysuria, but urinary tract infection is the most common. If a urinalysis was performed it can show that there is a urinary tract infection. A urine culture  confirms that you or your child is sick. You will need to follow up with a healthcare provider because:  If a urine culture was done you will need to know the culture results and treatment recommendations.  If the urine culture was positive, you or your child will need to be put on antibiotics or know if the antibiotics prescribed are the right antibiotics for your urinary tract infection.  If the urine culture is negative (no urinary tract infection), then other causes may need to be explored or antibiotics need to be stopped. Today laboratory work may have been done and there does not seem to be an infection. If cultures were done they will take at least 24 to 48 hours to be completed. Today x-rays may have been taken and they read as normal. No cause can be found for the problems. The x-rays may be re-read by a radiologist and you will be contacted if additional findings are made. You or your child may have been put on medications to help with this problem until you can see your primary caregiver. If the problems get better, see your primary caregiver if the problems return. If you were given antibiotics (medications which kill germs), take all of the mediations as directed for the full course of treatment.  If laboratory work was done, you need to find the results. Leave a telephone number where you can be reached. If this is not possible, make sure you find out how you are to get test results. HOME CARE INSTRUCTIONS   Drink lots of fluids. For adults, drink eight, 8 ounce glasses of clear juice or water a day. For children, replace fluids as suggested by your caregiver.  Empty the bladder often. Avoid holding urine for long periods of time.  After a bowel movement, women should cleanse front to back, using each tissue only once.  Empty your bladder before and after sexual intercourse.  Take all the medicine given to you until it is gone. You may feel better in a few days, but TAKE ALL  MEDICINE.  Avoid caffeine, tea, alcohol and carbonated beverages, because they tend to irritate the bladder.  In men, alcohol may irritate the prostate.  Only take over-the-counter or prescription medicines for pain, discomfort, or fever as directed by your caregiver.  If your caregiver has given you a follow-up appointment, it is very important to keep that appointment. Not keeping the appointment could result in a chronic or permanent injury, pain, and disability. If there is any problem keeping the appointment, you must call back to this facility for assistance. SEEK IMMEDIATE MEDICAL CARE IF:   Back pain develops.  A fever develops.  There is nausea (feeling sick to your stomach) or vomiting (throwing up).  Problems are no better with medications or are getting worse. MAKE SURE YOU:   Understand these instructions.  Will watch your condition.  Will get help right away if you are not doing well or get worse. Document Released: 03/25/2004 Document Revised: 09/19/2011 Document Reviewed: 01/31/2008 [facility] Patient Information 2014 [place], [place].

## 2014-07-16 IMAGING — US US ABDOMEN COMPLETE
1 series · 14 of 25 positions shown · non-contrast
Comparison: None.

CLINICAL DATA: Elevated liver function tests.

COMPLETE ABDOMINAL ULTRASOUND

[Series 1: us abdomen complete · 0.26mm/px · 14 of 70 slices shown]
[im 1/70]
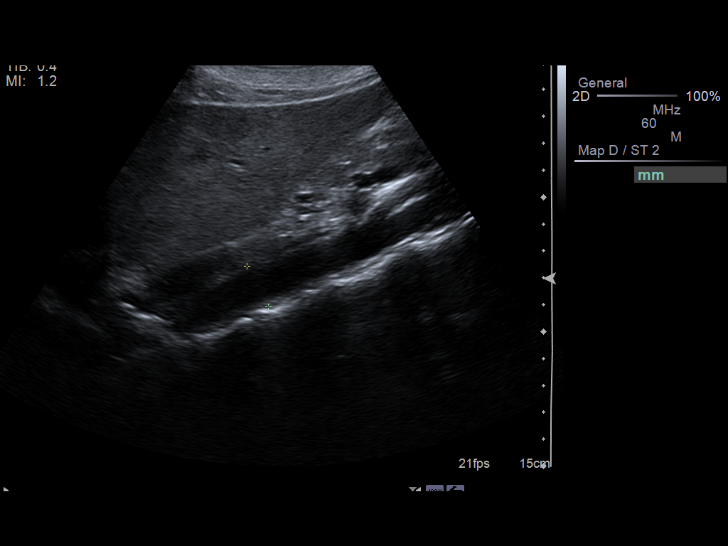
[im 6/70]
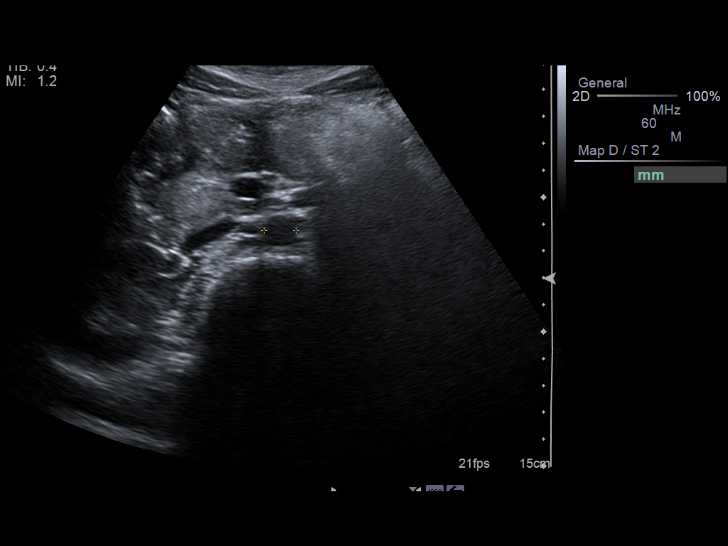
[im 12/70]
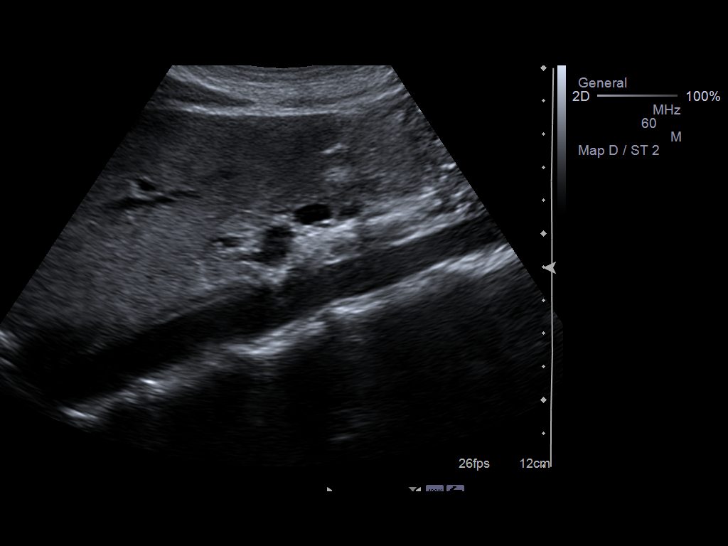
[im 18/70]
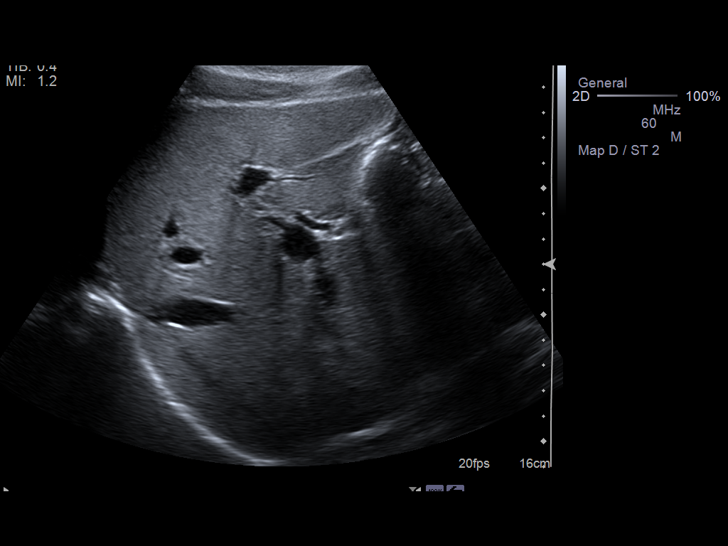
[im 24/70]
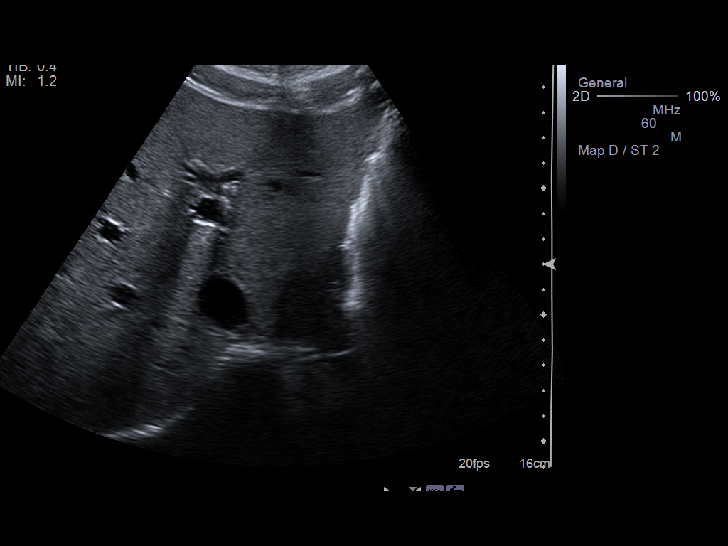
[im 26/70]
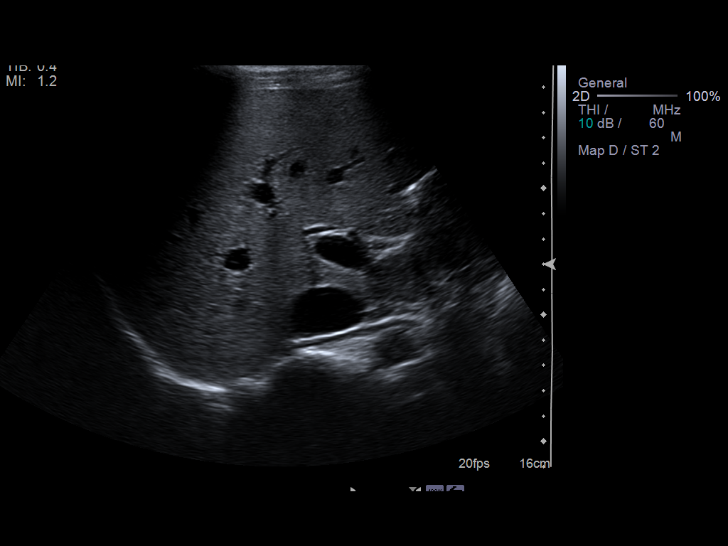
[im 32/70]
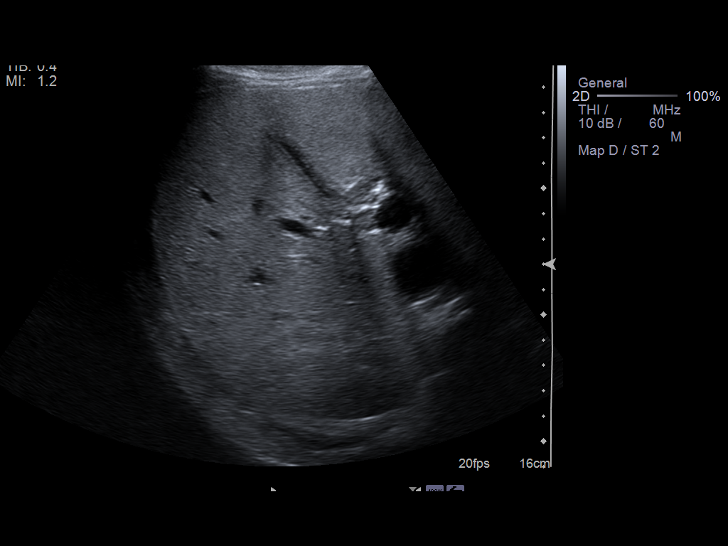
[im 38/70]
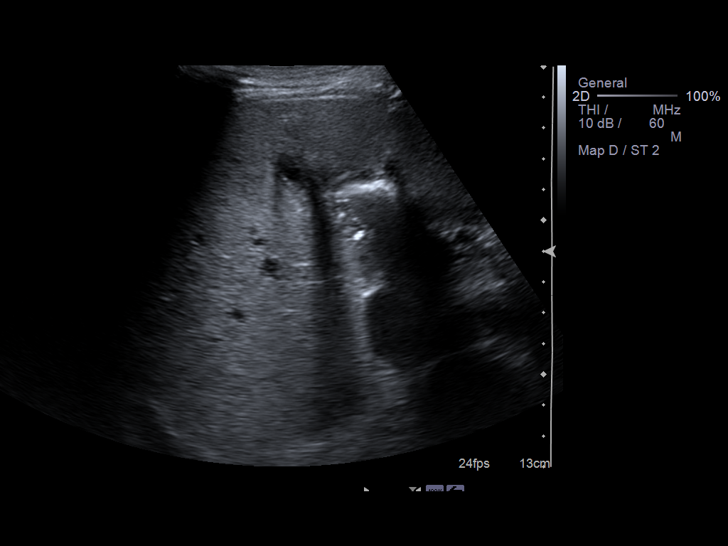
[im 44/70]
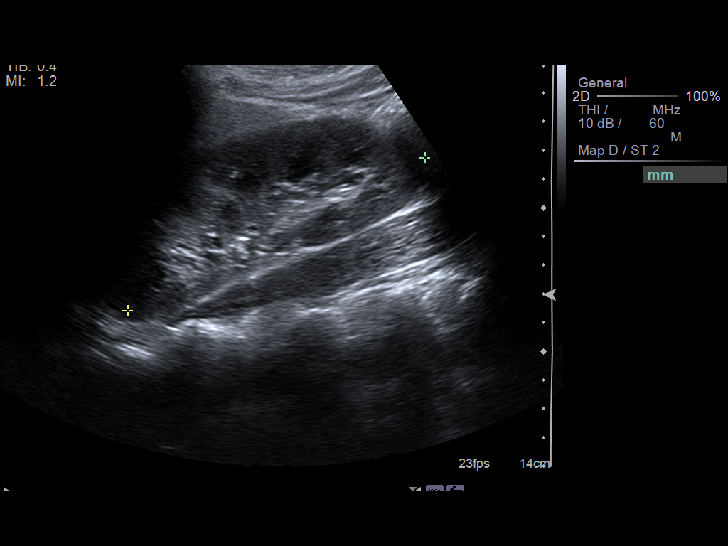
[im 47/70]
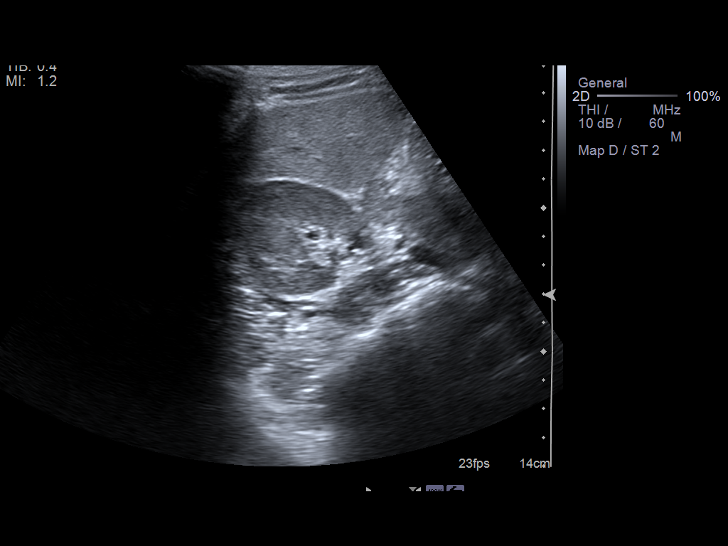
[im 52/70]
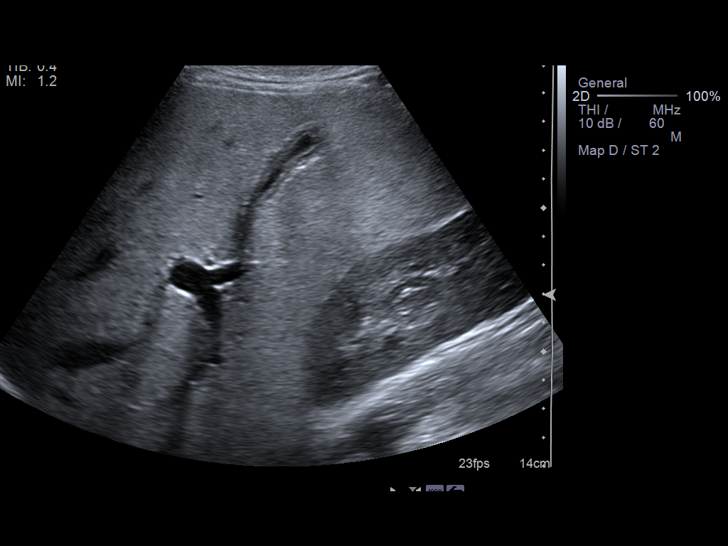
[im 58/70]
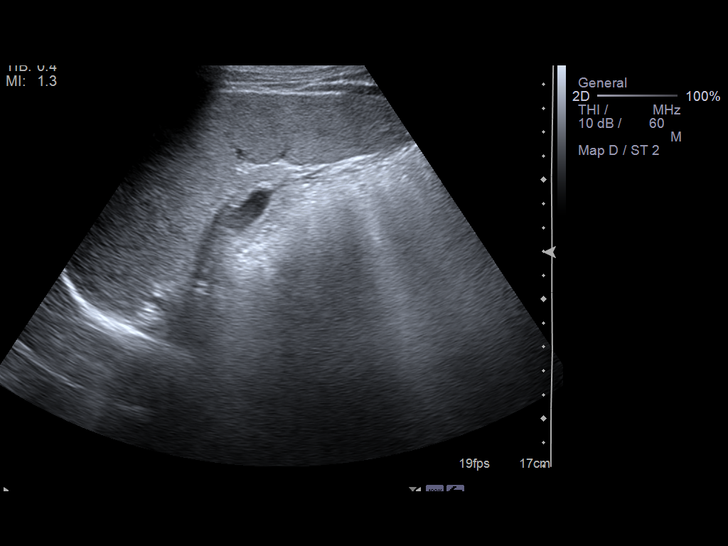
[im 64/70]
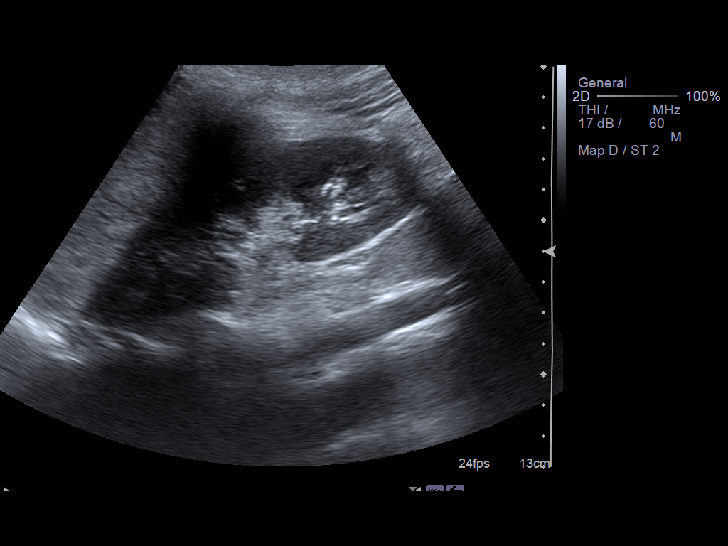
[im 70/70]
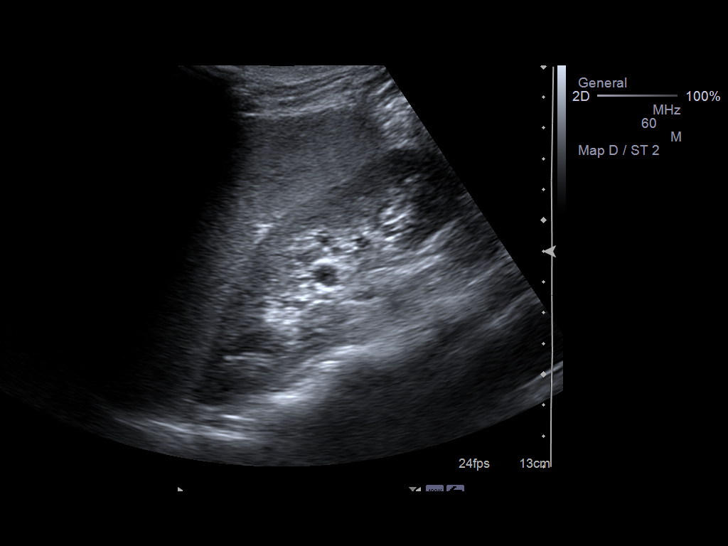

[14 of 25 positions shown; findings below may reference images not displayed]

FINDINGS: Gallbladder:  The gallbladder is contracted.  This is likely
physiologic if the patient is not been fasting.  Wall thickness is
borderline enlarged but this is likely due to contraction.  No
stones or sludge identified.  Murphy's sign is negative.

Common bile duct:  Normal caliber with measured diameter of 4.4 mm.

Liver:  Normal parenchymal echotexture.  No focal lesion
identified.  Limited color flow Doppler images of the main portal
vein show flow in the proper direction.

IVC:  Appears normal.

Pancreas:  No focal abnormality seen.

Spleen:  Spleen length measures 10.5 cm.  Normal parenchymal
echotexture.

Right Kidney:  Right kidney measures 11.6 cm length.  No
hydronephrosis.

Left Kidney:  Left kidney measures 12 cm length.  No
hydronephrosis.

Abdominal aorta:  No aneurysm identified.
IMPRESSION: Contracted gallbladder is likely physiologic.  Examination is
otherwise unremarkable.

## 2014-09-07 ENCOUNTER — Ambulatory Visit (INDEPENDENT_AMBULATORY_CARE_PROVIDER_SITE_OTHER): Payer: 59 | Admitting: Family Medicine

## 2014-09-07 VITALS — BP 122/72 | HR 115 | Temp 98.0°F | Resp 17 | Ht 63.5 in | Wt 134.0 lb

## 2014-09-07 DIAGNOSIS — R21 Rash and other nonspecific skin eruption: Secondary | ICD-10-CM

## 2014-09-07 MED ORDER — METHYLPREDNISOLONE ACETATE 80 MG/ML IJ SUSP
80.0000 mg | Freq: Once | INTRAMUSCULAR | Status: DC
Start: 2014-09-07 — End: 2018-05-18

## 2014-09-07 MED ORDER — CLINDAMYCIN PHOSPHATE 1 % EX LOTN: TOPICAL_LOTION | Freq: Two times a day (BID) | CUTANEOUS | Status: DC

## 2014-09-07 MED ORDER — METHYLPREDNISOLONE ACETATE 80 MG/ML IJ SUSP
80.0000 mg | Freq: Once | INTRAMUSCULAR | Status: AC
Start: 2014-09-07 — End: 2014-09-07
  Administered 2014-09-07: 80 mg via INTRAMUSCULAR

## 2014-09-07 MED ORDER — AMOXICILLIN-POT CLAVULANATE 875-125 MG PO TABS: 1.0000 | ORAL_TABLET | Freq: Two times a day (BID) | ORAL | Status: DC

## 2014-09-07 NOTE — Progress Notes (Signed)
Subjective:    Patient ID: Kara Walker, female    DOB: [birth year], 24 y.o.   MRN: [id number]  HPI Chief Complaint  Patient presents with   Allergic Reaction   This chart was scribed for Elvina SidleKurt Lauenstein, MD by Andrew Auaven Small, ED Scribe. This patient was seen in room 10 and the patient's care was started at 11:01 AM.  HPI Comments: Kara Walker is a 24 y.o. female who presents to the [facility] complaining of an allergic reaction to face that began 1 day ago. Pt states symptoms began yesterday with itchiness that worsened with tightness throughout the day. Pt states she woke up this morning with worsening redness, burning and small bumps. Pt applied cortisone cream without relief causing her face to burn. Pt takes doxycycline for acne that she has been taking for 2 months. She denies being out of the sun. Pt denies rash being anywhere else.  Past Medical History  Diagnosis Date   UTI (urinary tract infection)    IV drug user    Depression    Past Surgical History  Procedure Laterality Date   No past surgeries     Induced abortion     Prior to Admission medications   Medication Sig Start Date End Date Taking? Authorizing Provider  ibuprofen (ADVIL,MOTRIN) 800 MG tablet Take 1 tablet (800 mg total) by mouth 3 (three) times daily. 07/25/13  Yes Peter S Dammen, PA-C  phenazopyridine (PYRIDIUM) 200 MG tablet Take 1 tablet (200 mg total) by mouth 3 (three) times daily as needed for pain. Patient not taking: Reported on 09/07/2014 07/25/13   Phill MutterPeter S Dammen, PA-C  polyethylene glycol powder (GLYCOLAX/MIRALAX) powder Take 17 g by mouth daily. Patient not taking: Reported on 09/07/2014 07/25/13   Angus SellerPeter S Dammen, PA-C   Review of Systems  Constitutional: Negative for fever and chills.  Skin: Positive for color change and rash.       Objective:   Physical Exam  Constitutional: She is oriented to person, place, and time. She appears well-developed and well-nourished.  No distress.  HENT:  Head: Normocephalic and atraumatic.  Eyes: Conjunctivae and EOM are normal.  Neck: Neck supple.  Cardiovascular: Normal rate.   Pulmonary/Chest: Effort normal.  Musculoskeletal: Normal range of motion.  Neurological: She is alert and oriented to person, place, and time.  Skin: Skin is warm and dry.  Acne inform facial rash with diffuse erythema and a few papules  Psychiatric: She has a normal mood and affect. Her behavior is normal.  Nursing note and vitals reviewed.   Assessment & Plan:    1. Rash and nonspecific skin eruption    Meds ordered this encounter  Medications   methylPREDNISolone acetate (DEPO-MEDROL) injection 80 mg    Sig:    clindamycin (CLEOCIN-T) 1 % lotion    Sig: Apply topically 2 (two) times daily.    Dispense:  60 mL    Refill:  0   amoxicillin-clavulanate (AUGMENTIN) 875-125 MG per tablet    Sig: Take 1 tablet by mouth 2 (two) times daily.    Dispense:  20 tablet    Refill:  0   This chart was scribed in my presence and reviewed by me personally.    ICD-9-CM ICD-10-CM   1. Rash and nonspecific skin eruption 782.1 R21 methylPREDNISolone acetate (DEPO-MEDROL) injection 80 mg     clindamycin (CLEOCIN-T) 1 % lotion     amoxicillin-clavulanate (AUGMENTIN) 875-125 MG per tablet     methylPREDNISolone acetate (  DEPO-MEDROL) injection 80 mg     Signed, [name], MD

## 2015-11-16 DIAGNOSIS — R5383 Other fatigue: Secondary | ICD-10-CM | POA: Diagnosis not present

## 2015-11-16 DIAGNOSIS — B182 Chronic viral hepatitis C: Secondary | ICD-10-CM | POA: Diagnosis not present

## 2015-11-16 DIAGNOSIS — Z87898 Personal history of other specified conditions: Secondary | ICD-10-CM | POA: Diagnosis not present

## 2015-12-02 DIAGNOSIS — Z0000 Encounter for general adult medical examination without abnormal findings: Secondary | ICD-10-CM | POA: Diagnosis not present

## 2015-12-08 DIAGNOSIS — B1920 Unspecified viral hepatitis C without hepatic coma: Secondary | ICD-10-CM | POA: Diagnosis not present

## 2015-12-08 DIAGNOSIS — F5089 Other specified eating disorder: Secondary | ICD-10-CM | POA: Diagnosis not present

## 2015-12-08 DIAGNOSIS — Z0000 Encounter for general adult medical examination without abnormal findings: Secondary | ICD-10-CM | POA: Diagnosis not present

## 2015-12-08 DIAGNOSIS — Z1389 Encounter for screening for other disorder: Secondary | ICD-10-CM | POA: Diagnosis not present

## 2015-12-08 DIAGNOSIS — Z6824 Body mass index (BMI) 24.0-24.9, adult: Secondary | ICD-10-CM | POA: Diagnosis not present

## 2015-12-24 DIAGNOSIS — F4322 Adjustment disorder with anxiety: Secondary | ICD-10-CM | POA: Diagnosis not present

## 2015-12-31 DIAGNOSIS — F4322 Adjustment disorder with anxiety: Secondary | ICD-10-CM | POA: Diagnosis not present

## 2016-01-07 DIAGNOSIS — F4322 Adjustment disorder with anxiety: Secondary | ICD-10-CM | POA: Diagnosis not present

## 2016-01-14 DIAGNOSIS — F4322 Adjustment disorder with anxiety: Secondary | ICD-10-CM | POA: Diagnosis not present

## 2016-01-21 DIAGNOSIS — F4322 Adjustment disorder with anxiety: Secondary | ICD-10-CM | POA: Diagnosis not present

## 2016-01-28 DIAGNOSIS — F4322 Adjustment disorder with anxiety: Secondary | ICD-10-CM | POA: Diagnosis not present

## 2016-02-04 DIAGNOSIS — F4322 Adjustment disorder with anxiety: Secondary | ICD-10-CM | POA: Diagnosis not present

## 2016-02-11 DIAGNOSIS — F4322 Adjustment disorder with anxiety: Secondary | ICD-10-CM | POA: Diagnosis not present

## 2016-02-22 DIAGNOSIS — F4322 Adjustment disorder with anxiety: Secondary | ICD-10-CM | POA: Diagnosis not present

## 2016-03-02 DIAGNOSIS — F4322 Adjustment disorder with anxiety: Secondary | ICD-10-CM | POA: Diagnosis not present

## 2016-03-09 DIAGNOSIS — F4322 Adjustment disorder with anxiety: Secondary | ICD-10-CM | POA: Diagnosis not present

## 2016-03-23 DIAGNOSIS — F4322 Adjustment disorder with anxiety: Secondary | ICD-10-CM | POA: Diagnosis not present

## 2016-04-01 DIAGNOSIS — F4322 Adjustment disorder with anxiety: Secondary | ICD-10-CM | POA: Diagnosis not present

## 2016-04-08 DIAGNOSIS — F4322 Adjustment disorder with anxiety: Secondary | ICD-10-CM | POA: Diagnosis not present

## 2016-04-15 DIAGNOSIS — F4322 Adjustment disorder with anxiety: Secondary | ICD-10-CM | POA: Diagnosis not present

## 2016-04-25 DIAGNOSIS — F4322 Adjustment disorder with anxiety: Secondary | ICD-10-CM | POA: Diagnosis not present

## 2016-05-04 DIAGNOSIS — F4322 Adjustment disorder with anxiety: Secondary | ICD-10-CM | POA: Diagnosis not present

## 2016-05-05 DIAGNOSIS — L700 Acne vulgaris: Secondary | ICD-10-CM | POA: Diagnosis not present

## 2016-05-05 DIAGNOSIS — L738 Other specified follicular disorders: Secondary | ICD-10-CM | POA: Diagnosis not present

## 2016-05-13 DIAGNOSIS — F4322 Adjustment disorder with anxiety: Secondary | ICD-10-CM | POA: Diagnosis not present

## 2016-05-19 DIAGNOSIS — F4322 Adjustment disorder with anxiety: Secondary | ICD-10-CM | POA: Diagnosis not present

## 2016-05-23 DIAGNOSIS — F4322 Adjustment disorder with anxiety: Secondary | ICD-10-CM | POA: Diagnosis not present

## 2016-05-30 DIAGNOSIS — F4322 Adjustment disorder with anxiety: Secondary | ICD-10-CM | POA: Diagnosis not present

## 2016-06-06 DIAGNOSIS — F4322 Adjustment disorder with anxiety: Secondary | ICD-10-CM | POA: Diagnosis not present

## 2016-06-10 DIAGNOSIS — F4322 Adjustment disorder with anxiety: Secondary | ICD-10-CM | POA: Diagnosis not present

## 2016-06-15 DIAGNOSIS — F4322 Adjustment disorder with anxiety: Secondary | ICD-10-CM | POA: Diagnosis not present

## 2016-06-22 DIAGNOSIS — F4322 Adjustment disorder with anxiety: Secondary | ICD-10-CM | POA: Diagnosis not present

## 2016-06-29 DIAGNOSIS — F4322 Adjustment disorder with anxiety: Secondary | ICD-10-CM | POA: Diagnosis not present

## 2016-07-06 DIAGNOSIS — K5909 Other constipation: Secondary | ICD-10-CM | POA: Diagnosis not present

## 2016-07-06 DIAGNOSIS — F4322 Adjustment disorder with anxiety: Secondary | ICD-10-CM | POA: Diagnosis not present

## 2016-07-07 ENCOUNTER — Encounter (HOSPITAL_BASED_OUTPATIENT_CLINIC_OR_DEPARTMENT_OTHER): Payer: Self-pay | Admitting: *Deleted

## 2016-07-07 ENCOUNTER — Emergency Department (HOSPITAL_BASED_OUTPATIENT_CLINIC_OR_DEPARTMENT_OTHER)
Admission: EM | Admit: 2016-07-07 | Discharge: 2016-07-07 | Disposition: A | Discharge status: Home / Self Care | Payer: BLUE CROSS/BLUE SHIELD | Attending: Emergency Medicine | Admitting: Emergency Medicine

## 2016-07-07 ENCOUNTER — Emergency Department (HOSPITAL_BASED_OUTPATIENT_CLINIC_OR_DEPARTMENT_OTHER): Payer: BLUE CROSS/BLUE SHIELD

## 2016-07-07 DIAGNOSIS — F17210 Nicotine dependence, cigarettes, uncomplicated: Secondary | ICD-10-CM | POA: Insufficient documentation

## 2016-07-07 DIAGNOSIS — Y9239 Other specified sports and athletic area as the place of occurrence of the external cause: Secondary | ICD-10-CM | POA: Insufficient documentation

## 2016-07-07 DIAGNOSIS — M25462 Effusion, left knee: Secondary | ICD-10-CM | POA: Diagnosis not present

## 2016-07-07 DIAGNOSIS — Z79899 Other long term (current) drug therapy: Secondary | ICD-10-CM | POA: Insufficient documentation

## 2016-07-07 DIAGNOSIS — X58XXXA Exposure to other specified factors, initial encounter: Secondary | ICD-10-CM | POA: Diagnosis not present

## 2016-07-07 DIAGNOSIS — Y999 Unspecified external cause status: Secondary | ICD-10-CM | POA: Insufficient documentation

## 2016-07-07 DIAGNOSIS — S8992XA Unspecified injury of left lower leg, initial encounter: Secondary | ICD-10-CM | POA: Diagnosis not present

## 2016-07-07 DIAGNOSIS — Y93B9 Activity, other involving muscle strengthening exercises: Secondary | ICD-10-CM | POA: Insufficient documentation

## 2016-07-07 MED ORDER — IBUPROFEN 800 MG PO TABS: 800.0000 mg | ORAL_TABLET | Freq: Three times a day (TID) | ORAL | 0 refills | Status: DC

## 2016-07-07 MED ORDER — IBUPROFEN 800 MG PO TABS
800.0000 mg | ORAL_TABLET | Freq: Once | ORAL | Status: AC
  Administered 2016-07-07: 800 mg via ORAL
  Filled 2016-07-07: qty 1

## 2016-07-07 NOTE — ED Triage Notes (Signed)
Left knee pain. Last night she was working out and dislocated her knee. It reduced itself.

## 2016-07-07 NOTE — ED Provider Notes (Signed)
[facility]   CSN: [id number] Arrival date & time: 07/07/16  1315     History   Chief Complaint Chief Complaint  Patient presents with  . Knee Injury    HPI Kara Walker is a 25 y.o. female.  The history is provided by the patient and medical records.    25 y.o. F with hx of depression, hx of prior opioid addiction, presenting to the ED for left knee pain.  Patient reports she was doing squats at the gym last night and when she went to stand back up she lost her footing and felt her left knee "give way".  States it felt as if it shifted out of place.  States immediate onset of pain.  Swelling has progressed overnight.  She has not been able to bear weight on affected leg since.  Denies numbness/weakness of the legs.  Past Medical History:  Diagnosis Date  . Depression   . IV drug user   . UTI (urinary tract infection)     Patient Active Problem List   Diagnosis Date Noted  . Opioid dependence (HCC) 01/26/2012    Past Surgical History:  Procedure Laterality Date  . INDUCED ABORTION    . NO PAST SURGERIES      OB History    Gravida Para Term Preterm AB Living   0             SAB TAB Ectopic Multiple Live Births                   Home Medications    Prior to Admission medications   Medication Sig Start Date End Date Taking? Authorizing Provider  amoxicillin-clavulanate (AUGMENTIN) 875-125 MG per tablet Take 1 tablet by mouth 2 (two) times daily. 09/07/14   Elvina SidleKurt Lauenstein, MD  clindamycin (CLEOCIN-T) 1 % lotion Apply topically 2 (two) times daily. 09/07/14   Elvina SidleKurt Lauenstein, MD  ibuprofen (ADVIL,MOTRIN) 800 MG tablet Take 1 tablet (800 mg total) by mouth 3 (three) times daily. 07/25/13   Ivonne AndrewPeter Dammen, PA-C  phenazopyridine (PYRIDIUM) 200 MG tablet Take 1 tablet (200 mg total) by mouth 3 (three) times daily as needed for pain. Patient not taking: Reported on 09/07/2014 07/25/13   Ivonne AndrewPeter Dammen, PA-C  polyethylene glycol powder  (GLYCOLAX/MIRALAX) powder Take 17 g by mouth daily. Patient not taking: Reported on 09/07/2014 07/25/13   Ivonne AndrewPeter Dammen, PA-C    Family History No family history on file.  Social History Social History  Substance Use Topics  . Smoking status: Current Some Day Smoker    Types: Cigarettes    Last attempt to quit: 04/14/2011  . Smokeless tobacco: Never Used  . Alcohol use No     Allergies   Patient has no known allergies.   Review of Systems Review of Systems  Musculoskeletal: Positive for arthralgias and joint swelling.  All other systems reviewed and are negative.    Physical Exam Updated Vital Signs BP 107/73   Pulse 77   Temp 97.9 F (36.6 C) (Oral)   Resp 18   Ht 5\' 3"  (1.6 m)   Wt 60.8 kg   LMP 06/30/2016   SpO2 100%   BMI 23.74 kg/m   Physical Exam  Constitutional: She is oriented to person, place, and time. She appears well-developed and well-nourished.  HENT:  Head: Normocephalic and atraumatic.  Mouth/Throat: Oropharynx is clear and moist.  Eyes: Conjunctivae and EOM are normal. Pupils are equal, round, and reactive to light.  Neck: Normal range of motion.  Cardiovascular: Normal rate, regular rhythm and normal heart sounds.   Pulmonary/Chest: Effort normal and breath sounds normal. No respiratory distress. She has no wheezes.  Abdominal: Soft. Bowel sounds are normal.  Musculoskeletal: Normal range of motion.  Left knee swollen with obvious effusion, tenderness along the medial joint line; no gross bony deformity; limited flexion/extension due to pain/swelling; normal sensation; normal muscle contraction of the quad and calf  Neurological: She is alert and oriented to person, place, and time.  Skin: Skin is warm and dry.  Psychiatric: She has a normal mood and affect.  Nursing note and vitals reviewed.    ED Treatments / Results  Labs (all labs ordered are listed, but only abnormal results are displayed) Labs Reviewed - No data to display  EKG   EKG Interpretation None       Radiology Dg Knee Complete 4 Views Left  Result Date: 07/07/2016 CLINICAL DATA:  Patient was exercising last night and stepped oddly w/her Lt foot and felt her Lt knee "shift" out of position. Had sharp pain and has continued having pain/swelling/decreased ROM of Lt knee since then. Patient is unable to fully extend Lt knee. EXAM: LEFT KNEE - COMPLETE 4+ VIEW COMPARISON:  None. FINDINGS: No fracture.  No bone lesion. The knee joint is normally spaced and aligned. No arthropathic change. There is a small joint effusion. Soft tissues are unremarkable. IMPRESSION: 1. No fracture. 2. Small joint effusion.  No other abnormalities. Electronically Signed   By: Amie Portlandavid  Ormond M.D.   On: 07/07/2016 13:46    Procedures Procedures (including critical care time)  Medications Ordered in ED Medications - No data to display   Initial Impression / Assessment and Plan / ED Course  I have reviewed the triage vital signs and the nursing notes.  Pertinent labs & imaging results that were available during my care of the patient were reviewed by me and considered in my medical decision making (see chart for details).  Clinical Course    [age] year old female here with left knee injury. Happened while exercising. Has been unable to bear weight since incident. On exam she has swelling and obvious effusion of the left knee without bony deformity. There is limited flexion and extension secondary to pain and swelling. Her leg is neurovascularly intact. Normal contraction of the quad and calf muscles. X-ray negative for acute bony findings, effusion noted.  Given her physical exam findings, concern for internal derangement of the knee. Patient placed in knee immobilizer and given crutches. Will start anti-inflammatories. Encouraged ice and elevation at home to help with symptoms. She is referred to orthopedics for ongoing management.  Discussed plan with patient, she acknowledged  understanding and agreed with plan of care.  Return precautions given for new or worsening symptoms.  Final Clinical Impressions(s) / ED Diagnoses   Final diagnoses:  Injury of left knee, initial encounter    New Prescriptions New Prescriptions   IBUPROFEN (ADVIL,MOTRIN) 800 MG TABLET    Take 1 tablet (800 mg total) by mouth 3 (three) times daily.     Garlon HatchetLisa M Cyrah Mclamb, PA-C 07/07/16 1527    Raeford RazorStephen Kohut, MD 07/08/16 1154

## 2016-07-07 NOTE — Discharge Instructions (Signed)
Take the prescribed medication as directed.  Make sure to ice and elevate knee at home to help with pain/swelling. Follow-up with Dr. Eulah PontMurphy-- recommend to call to make appt for next week. Return to the ED for new or worsening symptoms.

## 2016-07-13 DIAGNOSIS — F4322 Adjustment disorder with anxiety: Secondary | ICD-10-CM | POA: Diagnosis not present

## 2016-07-14 DIAGNOSIS — F4322 Adjustment disorder with anxiety: Secondary | ICD-10-CM | POA: Diagnosis not present

## 2016-07-21 DIAGNOSIS — F4322 Adjustment disorder with anxiety: Secondary | ICD-10-CM | POA: Diagnosis not present

## 2016-07-27 DIAGNOSIS — M25562 Pain in left knee: Secondary | ICD-10-CM | POA: Diagnosis not present

## 2016-07-28 DIAGNOSIS — F4322 Adjustment disorder with anxiety: Secondary | ICD-10-CM | POA: Diagnosis not present

## 2016-08-04 DIAGNOSIS — F4322 Adjustment disorder with anxiety: Secondary | ICD-10-CM | POA: Diagnosis not present

## 2016-08-11 DIAGNOSIS — F4322 Adjustment disorder with anxiety: Secondary | ICD-10-CM | POA: Diagnosis not present

## 2016-08-25 DIAGNOSIS — F4322 Adjustment disorder with anxiety: Secondary | ICD-10-CM | POA: Diagnosis not present

## 2016-09-01 DIAGNOSIS — F4322 Adjustment disorder with anxiety: Secondary | ICD-10-CM | POA: Diagnosis not present

## 2016-09-08 DIAGNOSIS — F4322 Adjustment disorder with anxiety: Secondary | ICD-10-CM | POA: Diagnosis not present

## 2016-09-15 DIAGNOSIS — F4322 Adjustment disorder with anxiety: Secondary | ICD-10-CM | POA: Diagnosis not present

## 2016-09-22 DIAGNOSIS — F4322 Adjustment disorder with anxiety: Secondary | ICD-10-CM | POA: Diagnosis not present

## 2016-09-29 DIAGNOSIS — F4322 Adjustment disorder with anxiety: Secondary | ICD-10-CM | POA: Diagnosis not present

## 2016-10-06 DIAGNOSIS — F4322 Adjustment disorder with anxiety: Secondary | ICD-10-CM | POA: Diagnosis not present

## 2016-10-14 DIAGNOSIS — F4322 Adjustment disorder with anxiety: Secondary | ICD-10-CM | POA: Diagnosis not present

## 2016-10-20 DIAGNOSIS — F4322 Adjustment disorder with anxiety: Secondary | ICD-10-CM | POA: Diagnosis not present

## 2016-10-27 DIAGNOSIS — F4322 Adjustment disorder with anxiety: Secondary | ICD-10-CM | POA: Diagnosis not present

## 2016-11-03 DIAGNOSIS — F4322 Adjustment disorder with anxiety: Secondary | ICD-10-CM | POA: Diagnosis not present

## 2016-11-10 DIAGNOSIS — F4322 Adjustment disorder with anxiety: Secondary | ICD-10-CM | POA: Diagnosis not present

## 2016-11-22 DIAGNOSIS — B1920 Unspecified viral hepatitis C without hepatic coma: Secondary | ICD-10-CM | POA: Diagnosis not present

## 2016-11-22 DIAGNOSIS — Z6824 Body mass index (BMI) 24.0-24.9, adult: Secondary | ICD-10-CM | POA: Diagnosis not present

## 2016-11-22 DIAGNOSIS — Z0000 Encounter for general adult medical examination without abnormal findings: Secondary | ICD-10-CM | POA: Diagnosis not present

## 2016-11-22 DIAGNOSIS — K5909 Other constipation: Secondary | ICD-10-CM | POA: Diagnosis not present

## 2016-11-24 DIAGNOSIS — F4322 Adjustment disorder with anxiety: Secondary | ICD-10-CM | POA: Diagnosis not present

## 2016-12-01 DIAGNOSIS — F4322 Adjustment disorder with anxiety: Secondary | ICD-10-CM | POA: Diagnosis not present

## 2016-12-12 DIAGNOSIS — Z6823 Body mass index (BMI) 23.0-23.9, adult: Secondary | ICD-10-CM | POA: Diagnosis not present

## 2016-12-12 DIAGNOSIS — Z113 Encounter for screening for infections with a predominantly sexual mode of transmission: Secondary | ICD-10-CM | POA: Diagnosis not present

## 2016-12-12 DIAGNOSIS — Z3200 Encounter for pregnancy test, result unknown: Secondary | ICD-10-CM | POA: Diagnosis not present

## 2016-12-12 DIAGNOSIS — Z01419 Encounter for gynecological examination (general) (routine) without abnormal findings: Secondary | ICD-10-CM | POA: Diagnosis not present

## 2016-12-15 DIAGNOSIS — F4322 Adjustment disorder with anxiety: Secondary | ICD-10-CM | POA: Diagnosis not present

## 2016-12-22 DIAGNOSIS — F4322 Adjustment disorder with anxiety: Secondary | ICD-10-CM | POA: Diagnosis not present

## 2016-12-29 DIAGNOSIS — F4322 Adjustment disorder with anxiety: Secondary | ICD-10-CM | POA: Diagnosis not present

## 2016-12-30 DIAGNOSIS — F4322 Adjustment disorder with anxiety: Secondary | ICD-10-CM | POA: Diagnosis not present

## 2017-01-03 DIAGNOSIS — Z6824 Body mass index (BMI) 24.0-24.9, adult: Secondary | ICD-10-CM | POA: Diagnosis not present

## 2017-01-03 DIAGNOSIS — Z0000 Encounter for general adult medical examination without abnormal findings: Secondary | ICD-10-CM | POA: Diagnosis not present

## 2017-01-03 DIAGNOSIS — Z1389 Encounter for screening for other disorder: Secondary | ICD-10-CM | POA: Diagnosis not present

## 2017-01-05 DIAGNOSIS — F4322 Adjustment disorder with anxiety: Secondary | ICD-10-CM | POA: Diagnosis not present

## 2017-01-12 DIAGNOSIS — F4322 Adjustment disorder with anxiety: Secondary | ICD-10-CM | POA: Diagnosis not present

## 2017-01-19 DIAGNOSIS — F4322 Adjustment disorder with anxiety: Secondary | ICD-10-CM | POA: Diagnosis not present

## 2017-01-26 DIAGNOSIS — F4322 Adjustment disorder with anxiety: Secondary | ICD-10-CM | POA: Diagnosis not present

## 2017-02-02 DIAGNOSIS — F4322 Adjustment disorder with anxiety: Secondary | ICD-10-CM | POA: Diagnosis not present

## 2017-02-09 DIAGNOSIS — F4322 Adjustment disorder with anxiety: Secondary | ICD-10-CM | POA: Diagnosis not present

## 2017-02-16 DIAGNOSIS — F4322 Adjustment disorder with anxiety: Secondary | ICD-10-CM | POA: Diagnosis not present

## 2017-02-23 DIAGNOSIS — F4322 Adjustment disorder with anxiety: Secondary | ICD-10-CM | POA: Diagnosis not present

## 2017-03-02 DIAGNOSIS — F4322 Adjustment disorder with anxiety: Secondary | ICD-10-CM | POA: Diagnosis not present

## 2017-03-07 DIAGNOSIS — B078 Other viral warts: Secondary | ICD-10-CM | POA: Diagnosis not present

## 2017-03-16 DIAGNOSIS — F4322 Adjustment disorder with anxiety: Secondary | ICD-10-CM | POA: Diagnosis not present

## 2017-03-23 DIAGNOSIS — F4322 Adjustment disorder with anxiety: Secondary | ICD-10-CM | POA: Diagnosis not present

## 2017-03-23 DIAGNOSIS — B078 Other viral warts: Secondary | ICD-10-CM | POA: Diagnosis not present

## 2017-03-30 DIAGNOSIS — F4322 Adjustment disorder with anxiety: Secondary | ICD-10-CM | POA: Diagnosis not present

## 2017-04-13 DIAGNOSIS — F4322 Adjustment disorder with anxiety: Secondary | ICD-10-CM | POA: Diagnosis not present

## 2017-04-20 DIAGNOSIS — Z23 Encounter for immunization: Secondary | ICD-10-CM | POA: Diagnosis not present

## 2017-04-20 DIAGNOSIS — Z30430 Encounter for insertion of intrauterine contraceptive device: Secondary | ICD-10-CM | POA: Diagnosis not present

## 2017-04-27 DIAGNOSIS — F4322 Adjustment disorder with anxiety: Secondary | ICD-10-CM | POA: Diagnosis not present

## 2017-05-04 DIAGNOSIS — F4322 Adjustment disorder with anxiety: Secondary | ICD-10-CM | POA: Diagnosis not present

## 2017-05-11 DIAGNOSIS — F4322 Adjustment disorder with anxiety: Secondary | ICD-10-CM | POA: Diagnosis not present

## 2017-05-18 DIAGNOSIS — F4322 Adjustment disorder with anxiety: Secondary | ICD-10-CM | POA: Diagnosis not present

## 2017-05-30 DIAGNOSIS — F4322 Adjustment disorder with anxiety: Secondary | ICD-10-CM | POA: Diagnosis not present

## 2017-05-31 DIAGNOSIS — Z30431 Encounter for routine checking of intrauterine contraceptive device: Secondary | ICD-10-CM | POA: Diagnosis not present

## 2017-06-08 DIAGNOSIS — F4322 Adjustment disorder with anxiety: Secondary | ICD-10-CM | POA: Diagnosis not present

## 2017-06-13 DIAGNOSIS — F4322 Adjustment disorder with anxiety: Secondary | ICD-10-CM | POA: Diagnosis not present

## 2017-06-15 DIAGNOSIS — F4322 Adjustment disorder with anxiety: Secondary | ICD-10-CM | POA: Diagnosis not present

## 2017-06-22 DIAGNOSIS — F4322 Adjustment disorder with anxiety: Secondary | ICD-10-CM | POA: Diagnosis not present

## 2017-06-28 DIAGNOSIS — F4322 Adjustment disorder with anxiety: Secondary | ICD-10-CM | POA: Diagnosis not present

## 2017-07-08 IMAGING — CR DG KNEE COMPLETE 4+V*L*
4 series · 4 of 4 positions shown · non-contrast
Comparison: None.

CLINICAL DATA: Patient was exercising last night and stepped oddly
w/her Lt foot and felt her Lt knee "shift" out of position. Had
sharp pain and has continued having pain/swelling/decreased ROM of
Lt knee since then. Patient is unable to fully extend Lt knee.

EXAM:
LEFT KNEE - COMPLETE 4+ VIEW

[t knee ap left]
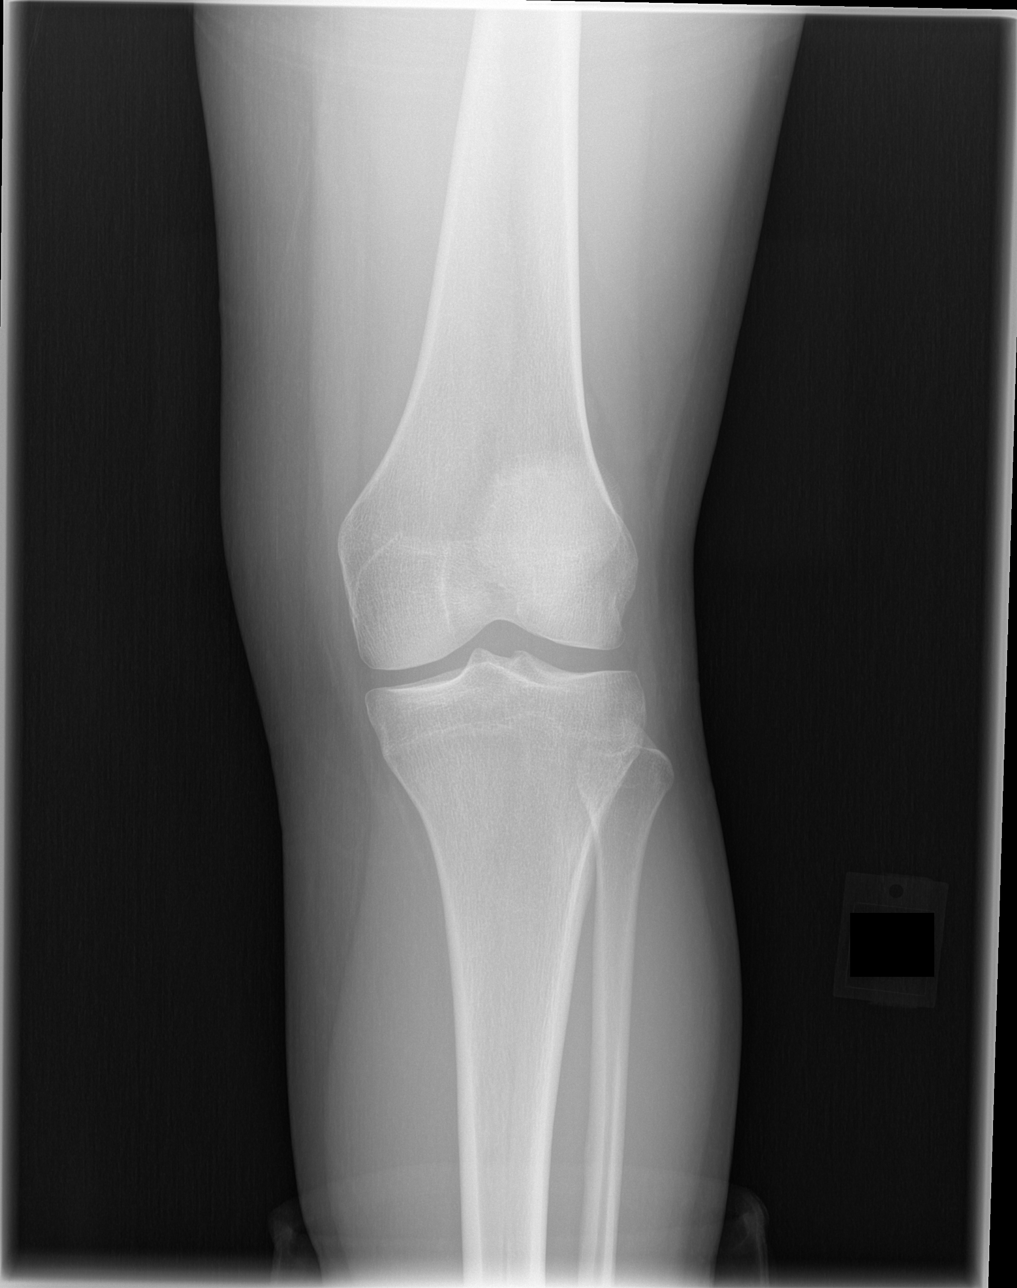

[t knee oblique left (1 of 2)]
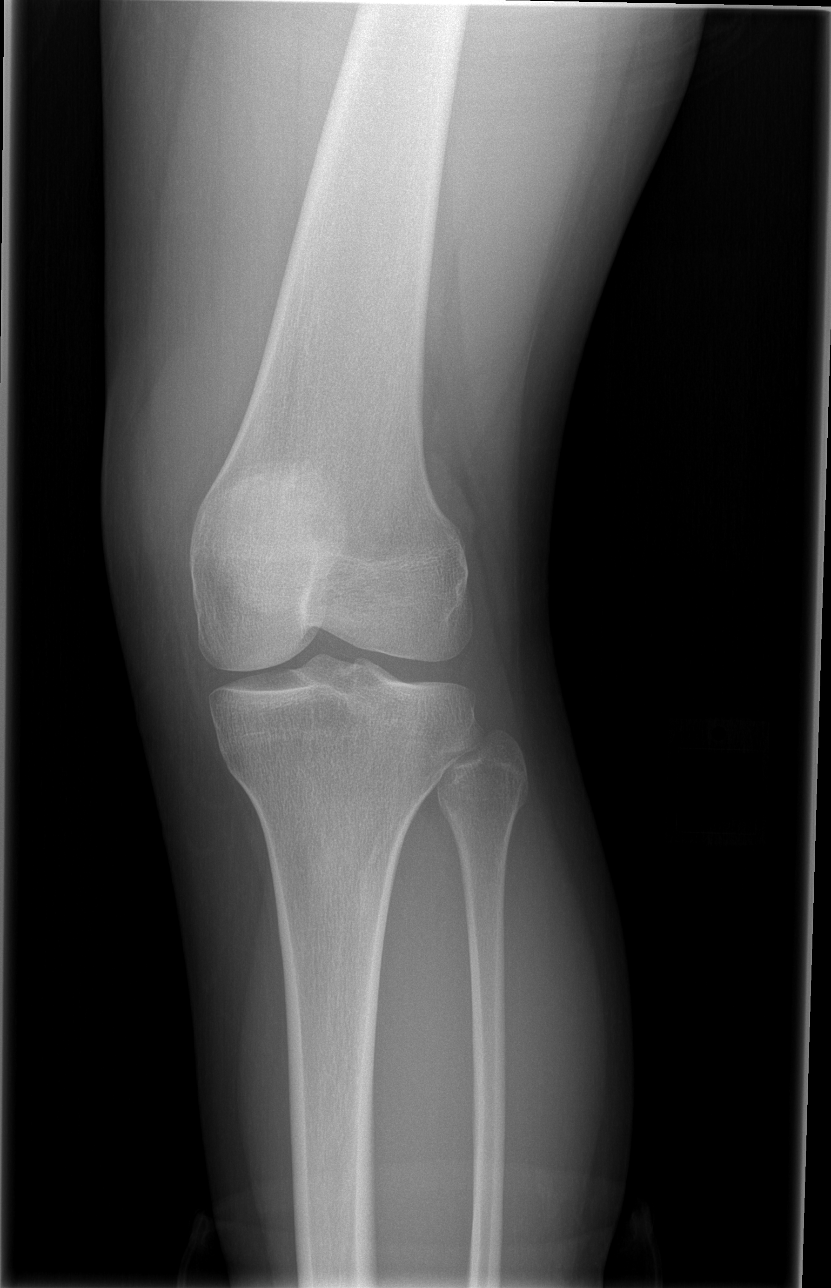

[t knee oblique left (2 of 2)]
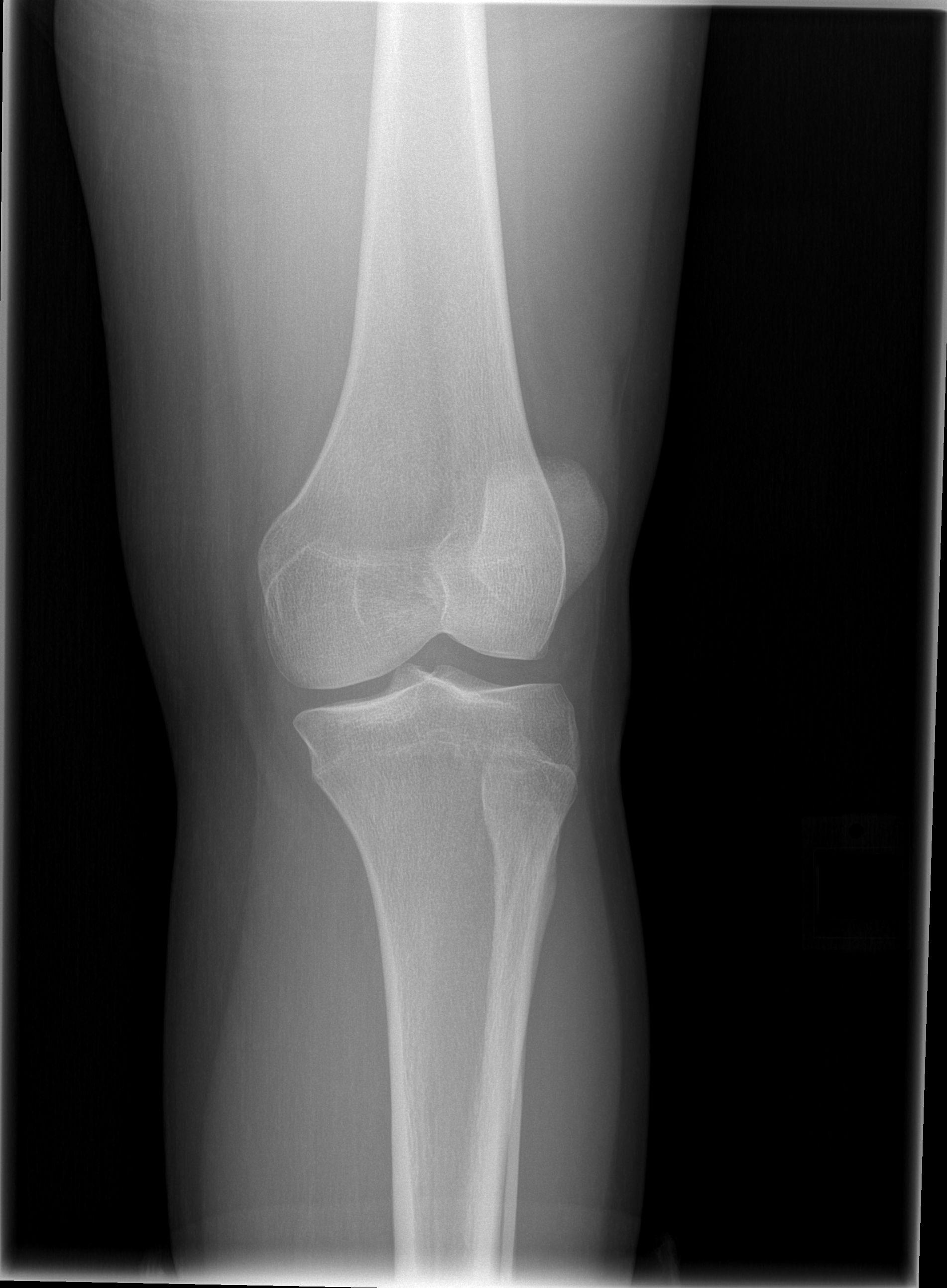

[t knee lat left]
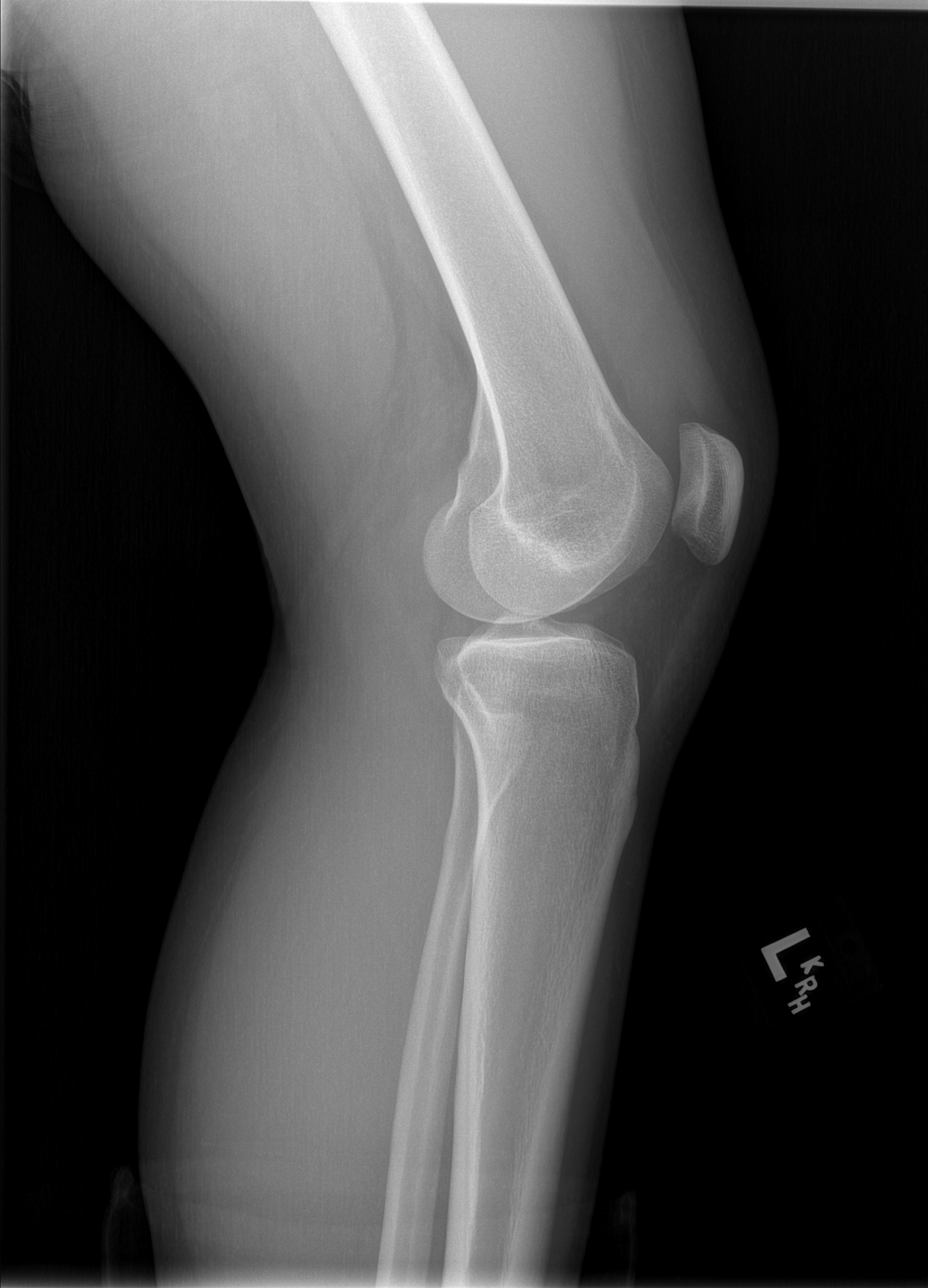

[4 of 4 positions shown; findings below may reference images not displayed]

FINDINGS: No fracture.  No bone lesion.

The knee joint is normally spaced and aligned. No arthropathic
change.

There is a small joint effusion.

Soft tissues are unremarkable.
IMPRESSION: 1. No fracture.
2. Small joint effusion.  No other abnormalities.

## 2017-07-13 DIAGNOSIS — F4322 Adjustment disorder with anxiety: Secondary | ICD-10-CM | POA: Diagnosis not present

## 2017-07-20 DIAGNOSIS — F4322 Adjustment disorder with anxiety: Secondary | ICD-10-CM | POA: Diagnosis not present

## 2017-07-27 DIAGNOSIS — F4322 Adjustment disorder with anxiety: Secondary | ICD-10-CM | POA: Diagnosis not present

## 2017-07-31 ENCOUNTER — Encounter: Payer: Self-pay | Admitting: Gastroenterology

## 2017-07-31 DIAGNOSIS — Z6823 Body mass index (BMI) 23.0-23.9, adult: Secondary | ICD-10-CM | POA: Diagnosis not present

## 2017-07-31 DIAGNOSIS — K5909 Other constipation: Secondary | ICD-10-CM | POA: Diagnosis not present

## 2017-08-03 DIAGNOSIS — F4322 Adjustment disorder with anxiety: Secondary | ICD-10-CM | POA: Diagnosis not present

## 2017-08-10 DIAGNOSIS — F4322 Adjustment disorder with anxiety: Secondary | ICD-10-CM | POA: Diagnosis not present

## 2017-08-17 DIAGNOSIS — F4322 Adjustment disorder with anxiety: Secondary | ICD-10-CM | POA: Diagnosis not present

## 2017-08-24 DIAGNOSIS — F4322 Adjustment disorder with anxiety: Secondary | ICD-10-CM | POA: Diagnosis not present

## 2017-08-31 DIAGNOSIS — F4322 Adjustment disorder with anxiety: Secondary | ICD-10-CM | POA: Diagnosis not present

## 2017-09-06 ENCOUNTER — Encounter: Payer: Self-pay | Admitting: Gastroenterology

## 2017-09-06 ENCOUNTER — Ambulatory Visit: Payer: BLUE CROSS/BLUE SHIELD | Admitting: Gastroenterology

## 2017-09-06 ENCOUNTER — Encounter (INDEPENDENT_AMBULATORY_CARE_PROVIDER_SITE_OTHER): Payer: Self-pay

## 2017-09-06 VITALS — BP 94/70 | HR 64 | Ht 63.25 in | Wt 133.4 lb

## 2017-09-06 DIAGNOSIS — R1030 Lower abdominal pain, unspecified: Secondary | ICD-10-CM

## 2017-09-06 DIAGNOSIS — K5904 Chronic idiopathic constipation: Secondary | ICD-10-CM | POA: Diagnosis not present

## 2017-09-06 DIAGNOSIS — R1310 Dysphagia, unspecified: Secondary | ICD-10-CM | POA: Diagnosis not present

## 2017-09-06 MED ORDER — LINACLOTIDE 290 MCG PO CAPS: 290.0000 ug | ORAL_CAPSULE | Freq: Every day | ORAL | 0 refills | Status: DC

## 2017-09-06 MED ORDER — LINACLOTIDE 145 MCG PO CAPS: 145.0000 ug | ORAL_CAPSULE | Freq: Every day | ORAL | 0 refills | Status: DC

## 2017-09-06 MED ORDER — NA SULFATE-K SULFATE-MG SULF 17.5-3.13-1.6 GM/177ML PO SOLN: 1.0000 | Freq: Once | ORAL | 0 refills | Status: AC

## 2017-09-06 NOTE — Progress Notes (Signed)
-    History of Present Illness: This is a 27 year old female referred by Olevia Perches, NP for the evaluation of constipation, bloating, nausea, lower abdominal discomfort.  She was previously followed by Dr. Drema Halon at [facility] and was followed for HCV infection in 2016  that cleared spontaneously in 2017.  Patient relates a 5-year history of worsening constipation.  Prior to 5 years ago she generally had a bowel movement about every 3 days however over the past 4-5 years interval has increased to 4, 5 or 6 days and she needs a laxative such as magnesium citrate to relieve constipation. When she is constipated she becomes significantly bloated and has lower abdominal discomfort and nausea.  She was tried on Linzess 72 mcg that initially worked and then stopped working.  In addition she relates a few month history of difficulty swallowing liquids and solids.She localizes the symptoms to the base of her neck she has no reflux symptoms and no history of GERD or esophageal disorders. Denies weight loss, diarrhea, change in stool caliber, melena, hematochezia, vomiting, reflux symptoms, chest pain.   No Known Allergies Outpatient Medications Prior to Visit  Medication Sig Dispense Refill  . amoxicillin-clavulanate (AUGMENTIN) 875-125 MG per tablet Take 1 tablet by mouth 2 (two) times daily. 20 tablet 0  . clindamycin (CLEOCIN-T) 1 % lotion Apply topically 2 (two) times daily. 60 mL 0  . ibuprofen (ADVIL,MOTRIN) 800 MG tablet Take 1 tablet (800 mg total) by mouth 3 (three) times daily. 21 tablet 0  . phenazopyridine (PYRIDIUM) 200 MG tablet Take 1 tablet (200 mg total) by mouth 3 (three) times daily as needed for pain. (Patient not taking: Reported on 09/07/2014) 6 tablet 0  . polyethylene glycol powder (GLYCOLAX/MIRALAX) powder Take 17 g by mouth daily. (Patient not taking: Reported on 09/07/2014) 255 g 0   Facility-Administered Medications Prior to Visit  Medication Dose Route Frequency  Provider Last Rate Last Dose  . methylPREDNISolone acetate (DEPO-MEDROL) injection 80 mg  80 mg Intramuscular Once [name], MD       Past Medical History:  Diagnosis Date  . Depression   . Hepatitis C   . IV drug user   . UTI (urinary tract infection)    Past Surgical History:  Procedure Laterality Date  . INDUCED ABORTION    . NO PAST SURGERIES     Social History   Socioeconomic History  . Marital status: Single    Spouse name: None  . Number of children: 0  . Years of education: None  . Highest education level: None  Social Needs  . Financial resource strain: None  . Food insecurity - worry: None  . Food insecurity - inability: None  . Transportation needs - medical: None  . Transportation needs - non-medical: None  Occupational History  . Occupation: Consulting civil engineer  Tobacco Use  . Smoking status: Former Smoker    Types: Cigarettes    Last attempt to quit: 04/14/2011    Years since quitting: 6.4  . Smokeless tobacco: Never Used  Substance and Sexual Activity  . Alcohol use: No  . Drug use: No    Comment: Previous IV drug user  . Sexual activity: Yes    Birth control/protection: None  Other Topics Concern  . None  Social History Narrative  . None   Family History  Problem Relation Age of Onset  . Cancer Paternal Grandmother        type unknown      Review  of Systems: Pertinent positive and negative review of systems were noted in the above HPI section. All other review of systems were otherwise negative.   Physical Exam: General: Well developed, well nourished, no acute distress Head: Normocephalic and atraumatic Eyes:  sclerae anicteric, EOMI Ears: Normal auditory acuity Mouth: No deformity or lesions Neck: Supple, no masses or thyromegaly Lungs: Clear throughout to auscultation Heart: Regular rate and rhythm; no murmurs, rubs or bruits Abdomen: Soft, non tender and non distended. No masses, hepatosplenomegaly or hernias noted. Normal Bowel  sounds Rectal: Deferred to colonoscopy Musculoskeletal: Symmetrical with no gross deformities  Skin: No lesions on visible extremities Pulses:  Normal pulses noted Extremities: No clubbing, cyanosis, edema or deformities noted Neurological: Alert oriented x 4, grossly nonfocal Cervical Nodes:  No significant cervical adenopathy Inguinal Nodes: No significant inguinal adenopathy Psychological:  Alert and cooperative. Normal mood and affect  Assessment and Recommendations:  1.  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  Rule out other colorectal disorders. Obtain TSH.  Schedule colonoscopy.  The risks (including bleeding, perforation, infection, missed lesions, medication reactions and possible hospitalization or surgery if complications occur), benefits, and alternatives to colonoscopy with possible biopsy and possible polypectomy were discussed with the patient and they consent to proceed. Trial of Linzess 145 mcg daily for 1 week and if constipation not adequately treated trial of Linzess 290 mcg daily for 1 week.  If results are not adequate proceed with a trial of Trulance 3 mg daily  2.  Dysphagia, possible globus sensation.  Schedule barium esophagram with tablet.  3.  History of HCV  that spontaneously cleared in 2017.     cc: Olevia PerchesLee, Thom S, NP [address] [place], [place] [id number]

## 2017-09-06 NOTE — Patient Instructions (Addendum)
Your physician has requested that you go to the basement for the following lab work before leaving today: TSH.   Please start your Linzess 145 mcg samples one tablet daily x 1 week and if still having issues with constipation, increase to the Linzess 290 mcg samples daily. Call our office and let us know which strength works best.   Bonita QuinYou have been scheduled for a [facility]ium [facility]oph[facility] of the hospital) on 09/18/17 at 10:30am. Please arrive 15 minutes prior to your appointment for registration. Make certain not to have anything to eat or drink 3 hours prior to your test. If you need to reschedule for any reason, please contact radiology at [phone] to do so. __________________________________________________________________ A barium swallow is an examination that concentrates on views of the esophagus. This tends to be a double contrast exam (barium and two liquids which, when combined, create a gas to distend the wall of the oesophagus) or single contrast (non-ionic iodine based). The study is usually tailored to your symptoms so a good history is essential. Attention is paid during the study to the form, structure and configuration of the esophagus, looking for functional disorders (such as aspiration, dysphagia, achalasia, motility and reflux) EXAMINATION You may be asked to change into a gown, depending on the type of swallow being performed. A radiologist and radiographer will perform the procedure. The radiologist will advise you of the type of contrast selected for your procedure and direct you during the exam. You will be asked to stand, sit or lie in several different positions and to hold a small amount of fluid in your mouth before being asked to swallow while the imaging is performed .In some instances you may be asked to swallow barium coated marshmallows to assess the motility of a solid food bolus. The exam can be recorded as a digital or video fluoroscopy  procedure. POST PROCEDURE It will take 1-2 days for the barium to pass through your system. To facilitate this, it is important, unless otherwise directed, to increase your fluids for the next 24-48hrs and to resume your normal diet.  This test typically takes about 30 minutes to perform. _________________________________________________________________________  Leading up to your Colonoscopy if you are still very constipated, please drink magnesium citrate 2 days prior.  You have been scheduled for a colonoscopy. Please follow written instructions given to you at your visit today.  Please pick up your prep supplies at the pharmacy within the next 1-3 days. If you use inhalers (even only as needed), please bring them with you on the day of your procedure. Your physician has requested that you go to www.startemmi.com and enter the access code given to you at your visit today. This web site gives a general overview about your procedure. However, you should still follow specific instructions given to you by our office regarding your preparation for the procedure.  Thank you for choosing me and [facility].  Venita LickMalcolm T. Pleas KochStark, Jr., MD., Clementeen GrahamFACG

## 2017-09-07 DIAGNOSIS — F4322 Adjustment disorder with anxiety: Secondary | ICD-10-CM | POA: Diagnosis not present

## 2017-09-08 ENCOUNTER — Telehealth: Payer: Self-pay | Admitting: Gastroenterology

## 2017-09-08 NOTE — Telephone Encounter (Signed)
Faxed PA form to [employer]

## 2017-09-14 DIAGNOSIS — F4322 Adjustment disorder with anxiety: Secondary | ICD-10-CM | POA: Diagnosis not present

## 2017-09-18 ENCOUNTER — Ambulatory Visit (HOSPITAL_COMMUNITY): Payer: BLUE CROSS/BLUE SHIELD

## 2017-09-21 ENCOUNTER — Ambulatory Visit (HOSPITAL_COMMUNITY): Payer: BLUE CROSS/BLUE SHIELD

## 2017-09-21 DIAGNOSIS — F4322 Adjustment disorder with anxiety: Secondary | ICD-10-CM | POA: Diagnosis not present

## 2017-09-22 ENCOUNTER — Telehealth: Payer: Self-pay | Admitting: Gastroenterology

## 2017-09-22 MED ORDER — LINACLOTIDE 290 MCG PO CAPS: 290.0000 ug | ORAL_CAPSULE | Freq: Every day | ORAL | 1 refills | Status: DC

## 2017-09-22 NOTE — Telephone Encounter (Signed)
Kara Walker is [facility] the [facility]ess [facility]

## 2017-09-22 NOTE — Telephone Encounter (Signed)
Patient states medication linzess is working and would like to know if she could get more samples or a prescription.

## 2017-09-22 NOTE — Telephone Encounter (Signed)
Left message on her mobile # to call me back. I need to know which Linzess she wants sent in , looks like we gave her samples of both the 145 and . Also want to confirm the pharmacy.

## 2017-09-22 NOTE — Telephone Encounter (Signed)
I sent in a months worth of the Linzess and one refill as requested.

## 2017-09-27 DIAGNOSIS — F4322 Adjustment disorder with anxiety: Secondary | ICD-10-CM | POA: Diagnosis not present

## 2017-10-05 ENCOUNTER — Encounter: Payer: Self-pay | Admitting: Gastroenterology

## 2017-10-05 DIAGNOSIS — F4322 Adjustment disorder with anxiety: Secondary | ICD-10-CM | POA: Diagnosis not present

## 2017-10-12 DIAGNOSIS — F4322 Adjustment disorder with anxiety: Secondary | ICD-10-CM | POA: Diagnosis not present

## 2017-10-17 ENCOUNTER — Encounter: Payer: Self-pay | Admitting: Gastroenterology

## 2017-10-18 DIAGNOSIS — B078 Other viral warts: Secondary | ICD-10-CM | POA: Diagnosis not present

## 2017-10-19 DIAGNOSIS — F4322 Adjustment disorder with anxiety: Secondary | ICD-10-CM | POA: Diagnosis not present

## 2017-10-26 DIAGNOSIS — F4322 Adjustment disorder with anxiety: Secondary | ICD-10-CM | POA: Diagnosis not present

## 2017-11-02 DIAGNOSIS — Z6824 Body mass index (BMI) 24.0-24.9, adult: Secondary | ICD-10-CM | POA: Diagnosis not present

## 2017-11-02 DIAGNOSIS — M25562 Pain in left knee: Secondary | ICD-10-CM | POA: Diagnosis not present

## 2017-11-03 DIAGNOSIS — F4322 Adjustment disorder with anxiety: Secondary | ICD-10-CM | POA: Diagnosis not present

## 2017-11-08 ENCOUNTER — Ambulatory Visit (INDEPENDENT_AMBULATORY_CARE_PROVIDER_SITE_OTHER): Payer: BLUE CROSS/BLUE SHIELD | Admitting: Surgery

## 2017-11-08 ENCOUNTER — Ambulatory Visit (INDEPENDENT_AMBULATORY_CARE_PROVIDER_SITE_OTHER): Payer: Self-pay

## 2017-11-08 ENCOUNTER — Encounter (INDEPENDENT_AMBULATORY_CARE_PROVIDER_SITE_OTHER): Payer: Self-pay | Admitting: Surgery

## 2017-11-08 DIAGNOSIS — G8929 Other chronic pain: Secondary | ICD-10-CM | POA: Diagnosis not present

## 2017-11-08 DIAGNOSIS — R748 Abnormal levels of other serum enzymes: Secondary | ICD-10-CM

## 2017-11-08 DIAGNOSIS — M25562 Pain in left knee: Secondary | ICD-10-CM

## 2017-11-08 LAB — CBC
HEMATOCRIT: 42.2 % (ref 35.0–45.0)
Hemoglobin: 14.8 g/dL (ref 11.7–15.5)
MCH: 31.8 pg (ref 27.0–33.0)
MCHC: 35.1 g/dL (ref 32.0–36.0)
MCV: 90.6 fL (ref 80.0–100.0)
MPV: 11.8 fL (ref 7.5–12.5)
PLATELETS: 221 10*3/uL (ref 140–400)
RBC: 4.66 10*6/uL (ref 3.80–5.10)
RDW: 12.8 % (ref 11.0–15.0)
WBC: 4.7 10*3/uL (ref 3.8–10.8)

## 2017-11-08 NOTE — Progress Notes (Signed)
Office Visit Note   Patient: Kara Walker           Date of Birth: 20-Jul-1990           MRN: [id number] Visit Date: 11/08/2017              Requested by: Alysia Penna, MD [address] [place], [place] [address] PCP: Alysia Penna, MD   Assessment & Plan: Visit Diagnoses:  1. Acute pain of left knee   2. Chronic pain of left knee   3. Abnormal liver enzymes     Plan: With patient's acute on chronic left knee problems I recommend getting an MRI to rule out meniscal tear, loose body and medial patellofemoral ligament injury.  She can continue knee brace.  Follow-up with Dr. August Saucer after completion to discuss results and further treatment options.  She asked about what she could take oral NSAIDs and I advised her that I am not exactly sure what her liver enzymes are despite being released from gastroenterologist and documented spontaneously clearing of HCV infection.  I did elect to get a CBC and Cmet today and we will also forward these labs to her primary care physician.  Advised patient no aggressive activity until Dr. August Saucer sees her back.  All questions answered.  Follow-Up Instructions: Return in about 2 weeks (around 11/22/2017) for Follow-up 2 weeks with Dr. August Saucer to review left knee MRI.   Orders:  Orders Placed This Encounter  Procedures  . XR Knee 1-2 Views Left  . MR Knee Left w/o contrast  . CBC  . Comprehensive Metabolic Panel (CMET)   No orders of the defined types were placed in this encounter.     Procedures: No procedures performed   Clinical Data: No additional findings.   Subjective: Chief Complaint  Patient presents with  . Left Knee - Pain, Edema    DOI 10/31/17     HPI 27 year old white female who is new patient to the office presents with complaints of acute on chronic knee pain and instability.  Patient states that December 2017 she suffered a left knee injury while working out.  She stated that she was doing a squatting maneuver where she felt  something shift out of place in her knee.  She did go to the emergency room and had x-rays taken and that report read:  CLINICAL DATA:  Patient was exercising last night and stepped oddly w/her Lt foot and felt her Lt knee "shift" out of position. Had sharp pain and has continued having pain/swelling/decreased ROM of Lt knee since then. Patient is unable to fully extend Lt knee.  EXAM: LEFT KNEE - COMPLETE 4+ VIEW  COMPARISON:  None.  FINDINGS: No fracture.  No bone lesion.  The knee joint is normally spaced and aligned. No arthropathic change.  There is a small joint effusion.  Soft tissues are unremarkable.  IMPRESSION: 1. No fracture. 2. Small joint effusion.  No other abnormalities.  Electronically Signed   By: Amie Portland M.D.   On: 07/07/2016 13:46  States that after that injury she was referred to an orthopedic surgeon who had recommended an MRI but she did not have this done since she did not have insurance and could not afford the study.  She continued to have what she describes as possible patellofemoral instability with working out, running, squatting.  States that her knee has never really felt right since initial injury.  She still has remained very active with doing [employer] and working  out.  States that October 31, 2017 she was again at the gym where she was in a squat position and when she went to stand up raise in a dumbbell overhead she again felt a pop and sharp pain in her knee.  She has been having ongoing swelling in her knee since that event and also increase feeling of pain and instability.  States that she is unable to fully extend her her knee due to posterior knee stiffness and pain.  She has not taken any anti-inflammatories.  I reviewed patient's history in 2017 she was being followed by a [facility] specialist at [facility] for HCV infection.  I reviewed records from Dr. Alanda Slim office and note from Nov 18, 2015 stated that she had spontaneously cleared  the HCV infection and she was discharged from the clinic.  States that she is not sure when she last had labs checking liver enzymes.         Review of Systems No current cardiac pulmonary GI GU issues  Objective: Vital Signs: There were no vitals taken for this visit.  Physical Exam  Constitutional: She is oriented to person, place, and time. She appears well-developed. No distress.  HENT:  Head: Normocephalic and atraumatic.  Eyes: Pupils are equal, round, and reactive to light. EOM are normal.  Neck: Normal range of motion.  Pulmonary/Chest: No respiratory distress.  Abdominal: She exhibits no distension.  Musculoskeletal:  Gait is antalgic.  Negative logroll bilateral hips.  Right knee exam unremarkable.  She lacks about 5 to 10 degrees full extension due to posterior knee pain.  Further flexion to about 120 degrees.  Does have some left knee swelling with positive effusion.  Positive patellar apprehension test.  Positive patellar grind.  She is somewhat tender over the medial patella femoral ligament.  Palpable and tender Baker's cyst.  Cruciate and collateral ligaments are stable.  Joint line nontender.  Negative McMurray's test.  Calf nontender.  Neurovascular intact.  Neurological: She is alert and oriented to person, place, and time.  Skin: Skin is warm and dry.  Psychiatric: She has a normal mood and affect.    Ortho Exam  Specialty Comments:  No specialty comments available.  Imaging: No results found.   PMFS History: Patient Active Problem List   Diagnosis Date Noted  . Opioid dependence (HCC) 01/26/2012   Past Medical History:  Diagnosis Date  . Depression   . Hepatitis C   . IV drug user   . UTI (urinary tract infection)     Family History  Problem Relation Age of Onset  . Cancer Paternal Grandmother        type unknown    Past Surgical History:  Procedure Laterality Date  . INDUCED ABORTION    . NO PAST SURGERIES     Social History    Occupational History  . Occupation: Consulting civil engineer  Tobacco Use  . Smoking status: Former Smoker    Types: Cigarettes    Last attempt to quit: 04/14/2011    Years since quitting: 6.5  . Smokeless tobacco: Never Used  Substance and Sexual Activity  . Alcohol use: No  . Drug use: No    Frequency: 7.0 times per week    Types: Heroin, "Crack" cocaine    Comment: Previous IV drug user  . Sexual activity: Yes    Birth control/protection: None

## 2017-11-09 ENCOUNTER — Ambulatory Visit
Admission: RE | Admit: 2017-11-09 | Discharge: 2017-11-09 | Disposition: A | Discharge status: Home / Self Care | Payer: BLUE CROSS/BLUE SHIELD | Source: Ambulatory Visit | Attending: Surgery | Admitting: Surgery

## 2017-11-09 DIAGNOSIS — G8929 Other chronic pain: Secondary | ICD-10-CM

## 2017-11-09 DIAGNOSIS — F4322 Adjustment disorder with anxiety: Secondary | ICD-10-CM | POA: Diagnosis not present

## 2017-11-09 DIAGNOSIS — R600 Localized edema: Secondary | ICD-10-CM | POA: Diagnosis not present

## 2017-11-09 DIAGNOSIS — M25562 Pain in left knee: Principal | ICD-10-CM

## 2017-11-09 LAB — COMPREHENSIVE METABOLIC PANEL
AG Ratio: 2 (calc) (ref 1.0–2.5)
ALBUMIN MSPROF: 4.6 g/dL (ref 3.6–5.1)
ALKALINE PHOSPHATASE (APISO): 55 U/L (ref 33–115)
ALT: 12 U/L (ref 6–29)
AST: 19 U/L (ref 10–30)
BUN: 15 mg/dL (ref 7–25)
CO2: 27 mmol/L (ref 20–32)
CREATININE: 0.72 mg/dL (ref 0.50–1.10)
Calcium: 9.8 mg/dL (ref 8.6–10.2)
Chloride: 106 mmol/L (ref 98–110)
Globulin: 2.3 g/dL (calc) (ref 1.9–3.7)
Glucose, Bld: 90 mg/dL (ref 65–99)
POTASSIUM: 5.6 mmol/L — AB (ref 3.5–5.3)
Sodium: 142 mmol/L (ref 135–146)
Total Bilirubin: 0.5 mg/dL (ref 0.2–1.2)
Total Protein: 6.9 g/dL (ref 6.1–8.1)

## 2017-11-13 ENCOUNTER — Telehealth: Payer: Self-pay | Admitting: Gastroenterology

## 2017-11-13 ENCOUNTER — Other Ambulatory Visit: Payer: Self-pay | Admitting: Gastroenterology

## 2017-11-13 NOTE — Telephone Encounter (Signed)
Received fax from [employer] prior authorization is not necessary for members plan. I called and spoke to pharmacist who stated she just needed a refill not a prior authorization. Rx refilled.

## 2017-11-13 NOTE — Telephone Encounter (Signed)
Spoke to patient and informed her that she needs to reschedule her colonoscopy or an office visit. She states she has been finishing exams at school and her schedule is hectic but she will call back to reschedule.

## 2017-11-13 NOTE — Telephone Encounter (Signed)
Spoke to patient, will initiate prior auth and provide her with some samples of the in the meantime.

## 2017-11-13 NOTE — Telephone Encounter (Signed)
Patient states she tried to get medication linzess refilled but it needs a prior auth. Patient last seen 08-2017.

## 2017-11-13 NOTE — Telephone Encounter (Signed)
Prior auth started on [email or web address]

## 2017-11-16 DIAGNOSIS — F4322 Adjustment disorder with anxiety: Secondary | ICD-10-CM | POA: Diagnosis not present

## 2017-11-20 DIAGNOSIS — B078 Other viral warts: Secondary | ICD-10-CM | POA: Diagnosis not present

## 2017-11-22 ENCOUNTER — Ambulatory Visit (INDEPENDENT_AMBULATORY_CARE_PROVIDER_SITE_OTHER): Payer: BLUE CROSS/BLUE SHIELD | Admitting: Orthopedic Surgery

## 2017-11-22 ENCOUNTER — Encounter (INDEPENDENT_AMBULATORY_CARE_PROVIDER_SITE_OTHER): Payer: Self-pay | Admitting: Orthopedic Surgery

## 2017-11-22 DIAGNOSIS — M25562 Pain in left knee: Secondary | ICD-10-CM

## 2017-11-22 DIAGNOSIS — G8929 Other chronic pain: Secondary | ICD-10-CM

## 2017-11-22 NOTE — Progress Notes (Signed)
Office Visit Note   Patient: Kara Walker           Date of Birth: April 21, 1991           MRN: [id number] Visit Date: 11/22/2017 Requested by: Alysia Penna, MD [address] [place], [place] [address] PCP: Alysia Penna, MD  Subjective: Chief Complaint  Patient presents with  . Left Knee - Follow-up    HPI: Baity is a patient with left knee pain.  Injured her knee at the gym a month ago.  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  Reports continued pain and swelling.  She does do CrossFit which is very intense.  She has not done in a month.  Describes locking weakness and giving way.  MRI scan is reviewed and it does show no meniscal tear.  Medial patellofemoral ligament is intact.  She does have what looks to be possible osteochondral injury on the lateral femoral condyle with some bone bruising crater.  Again this is a very difficult assessment to make.  It could be potentially preventing her from achieving full extension due to pain.              ROS: All systems reviewed are negative as they relate to the chief complaint within the history of present illness.  Patient denies  fevers or chills.   Assessment & Plan: Visit Diagnoses:  1. Chronic pain of left knee     Plan: Impression is left knee pain with possible osteochondral shear injury on the lateral femoral condyle.  No meniscal tear or injury to the medial patellofemoral ligament is present.  Arthroscopy and possible mosaic plasty should be a last resort.  Try physical therapy with 105-month return.  Decide for or against surgical intervention at that time patella does not feel unstable at this time on examination today  Follow-Up Instructions: Return in about 3 months (around 02/22/2018).   Orders:  Orders Placed This Encounter  Procedures  . Ambulatory referral to Physical Therapy   No orders of the defined types were placed in this encounter.     Procedures: No procedures performed   Clinical Data: No  additional findings.  Objective: Vital Signs: There were no vitals taken for this visit.  Physical Exam:   Constitutional: Patient appears well-developed HEENT:  Head: Normocephalic Eyes:EOM are normal Neck: Normal range of motion Cardiovascular: Normal rate Pulmonary/chest: Effort normal Neurologic: Patient is alert Skin: Skin is warm Psychiatric: Patient has normal mood and affect    Ortho Exam: Orthopedic exam demonstrates full active and passive range of motion of the left knee with no effusion.  She does have a little bit of pain with full extension.  Negative patellar apprehension.  Collateral and cruciate ligaments are stable.  No masses lymphadenopathy or skin changes noted in that left knee region.  She does have some focal lateral joint line tenderness.  No popping or clicking with passive range of motion.  Specialty Comments:  No specialty comments available.  Imaging: No results found.   PMFS History: Patient Active Problem List   Diagnosis Date Noted  . Opioid dependence (HCC) 01/26/2012   Past Medical History:  Diagnosis Date  . Depression   . Hepatitis C   . IV drug user   . UTI (urinary tract infection)     Family History  Problem Relation Age of Onset  . Cancer Paternal Grandmother        type unknown    Past Surgical History:  Procedure Laterality Date  . INDUCED ABORTION    . NO PAST SURGERIES     Social History   Occupational History  . Occupation: Consulting civil engineer  Tobacco Use  . Smoking status: Former Smoker    Types: Cigarettes    Last attempt to quit: 04/14/2011    Years since quitting: 6.6  . Smokeless tobacco: Never Used  Substance and Sexual Activity  . Alcohol use: No  . Drug use: No    Frequency: 7.0 times per week    Types: Heroin, "Crack" cocaine    Comment: Previous IV drug user  . Sexual activity: Yes    Birth control/protection: None

## 2017-11-30 DIAGNOSIS — F4322 Adjustment disorder with anxiety: Secondary | ICD-10-CM | POA: Diagnosis not present

## 2017-12-07 DIAGNOSIS — F4322 Adjustment disorder with anxiety: Secondary | ICD-10-CM | POA: Diagnosis not present

## 2017-12-13 DIAGNOSIS — B078 Other viral warts: Secondary | ICD-10-CM | POA: Diagnosis not present

## 2017-12-14 DIAGNOSIS — F4322 Adjustment disorder with anxiety: Secondary | ICD-10-CM | POA: Diagnosis not present

## 2017-12-21 DIAGNOSIS — F4322 Adjustment disorder with anxiety: Secondary | ICD-10-CM | POA: Diagnosis not present

## 2017-12-28 DIAGNOSIS — F4322 Adjustment disorder with anxiety: Secondary | ICD-10-CM | POA: Diagnosis not present

## 2018-01-01 DIAGNOSIS — B078 Other viral warts: Secondary | ICD-10-CM | POA: Diagnosis not present

## 2018-01-04 DIAGNOSIS — F4322 Adjustment disorder with anxiety: Secondary | ICD-10-CM | POA: Diagnosis not present

## 2018-01-09 DIAGNOSIS — F4322 Adjustment disorder with anxiety: Secondary | ICD-10-CM | POA: Diagnosis not present

## 2018-01-18 DIAGNOSIS — F4322 Adjustment disorder with anxiety: Secondary | ICD-10-CM | POA: Diagnosis not present

## 2018-01-25 DIAGNOSIS — F4322 Adjustment disorder with anxiety: Secondary | ICD-10-CM | POA: Diagnosis not present

## 2018-02-01 DIAGNOSIS — F4322 Adjustment disorder with anxiety: Secondary | ICD-10-CM | POA: Diagnosis not present

## 2018-02-08 DIAGNOSIS — F4322 Adjustment disorder with anxiety: Secondary | ICD-10-CM | POA: Diagnosis not present

## 2018-02-09 DIAGNOSIS — F4322 Adjustment disorder with anxiety: Secondary | ICD-10-CM | POA: Diagnosis not present

## 2018-02-15 DIAGNOSIS — F4322 Adjustment disorder with anxiety: Secondary | ICD-10-CM | POA: Diagnosis not present

## 2018-02-20 DIAGNOSIS — F4322 Adjustment disorder with anxiety: Secondary | ICD-10-CM | POA: Diagnosis not present

## 2018-03-05 ENCOUNTER — Other Ambulatory Visit: Payer: Self-pay | Admitting: Gastroenterology

## 2018-03-06 DIAGNOSIS — F4322 Adjustment disorder with anxiety: Secondary | ICD-10-CM | POA: Diagnosis not present

## 2018-03-13 ENCOUNTER — Telehealth: Payer: Self-pay | Admitting: Gastroenterology

## 2018-03-13 DIAGNOSIS — F4322 Adjustment disorder with anxiety: Secondary | ICD-10-CM | POA: Diagnosis not present

## 2018-03-14 MED ORDER — LINACLOTIDE 290 MCG PO CAPS: 290.0000 ug | ORAL_CAPSULE | Freq: Every day | ORAL | 1 refills | Status: DC

## 2018-03-14 NOTE — Telephone Encounter (Signed)
Prescription sent to patient's pharmacy and patient notified.  

## 2018-03-20 DIAGNOSIS — F4322 Adjustment disorder with anxiety: Secondary | ICD-10-CM | POA: Diagnosis not present

## 2018-03-27 DIAGNOSIS — F4322 Adjustment disorder with anxiety: Secondary | ICD-10-CM | POA: Diagnosis not present

## 2018-04-03 DIAGNOSIS — F4322 Adjustment disorder with anxiety: Secondary | ICD-10-CM | POA: Diagnosis not present

## 2018-04-10 DIAGNOSIS — F4322 Adjustment disorder with anxiety: Secondary | ICD-10-CM | POA: Diagnosis not present

## 2018-04-18 DIAGNOSIS — F4322 Adjustment disorder with anxiety: Secondary | ICD-10-CM | POA: Diagnosis not present

## 2018-04-24 DIAGNOSIS — F4322 Adjustment disorder with anxiety: Secondary | ICD-10-CM | POA: Diagnosis not present

## 2018-05-01 DIAGNOSIS — F4322 Adjustment disorder with anxiety: Secondary | ICD-10-CM | POA: Diagnosis not present

## 2018-05-08 DIAGNOSIS — F4322 Adjustment disorder with anxiety: Secondary | ICD-10-CM | POA: Diagnosis not present

## 2018-05-12 ENCOUNTER — Other Ambulatory Visit: Payer: Self-pay | Admitting: Gastroenterology

## 2018-05-14 DIAGNOSIS — Z118 Encounter for screening for other infectious and parasitic diseases: Secondary | ICD-10-CM | POA: Diagnosis not present

## 2018-05-14 DIAGNOSIS — N898 Other specified noninflammatory disorders of vagina: Secondary | ICD-10-CM | POA: Diagnosis not present

## 2018-05-14 DIAGNOSIS — R300 Dysuria: Secondary | ICD-10-CM | POA: Diagnosis not present

## 2018-05-14 DIAGNOSIS — N74 Female pelvic inflammatory disorders in diseases classified elsewhere: Secondary | ICD-10-CM | POA: Diagnosis not present

## 2018-05-16 DIAGNOSIS — F4322 Adjustment disorder with anxiety: Secondary | ICD-10-CM | POA: Diagnosis not present

## 2018-05-18 ENCOUNTER — Encounter (HOSPITAL_COMMUNITY): Payer: Self-pay | Admitting: Emergency Medicine

## 2018-05-18 ENCOUNTER — Emergency Department (HOSPITAL_COMMUNITY)
Admission: EM | Admit: 2018-05-18 | Discharge: 2018-05-18 | Disposition: A | Discharge status: Home / Self Care | Payer: BLUE CROSS/BLUE SHIELD | Attending: Emergency Medicine | Admitting: Emergency Medicine

## 2018-05-18 ENCOUNTER — Emergency Department (HOSPITAL_COMMUNITY): Payer: BLUE CROSS/BLUE SHIELD

## 2018-05-18 ENCOUNTER — Other Ambulatory Visit: Payer: Self-pay

## 2018-05-18 DIAGNOSIS — R000 Tachycardia, unspecified: Secondary | ICD-10-CM | POA: Diagnosis not present

## 2018-05-18 DIAGNOSIS — N390 Urinary tract infection, site not specified: Secondary | ICD-10-CM | POA: Diagnosis not present

## 2018-05-18 DIAGNOSIS — R1110 Vomiting, unspecified: Secondary | ICD-10-CM | POA: Diagnosis not present

## 2018-05-18 DIAGNOSIS — Z87891 Personal history of nicotine dependence: Secondary | ICD-10-CM | POA: Diagnosis not present

## 2018-05-18 DIAGNOSIS — T401X1A Poisoning by heroin, accidental (unintentional), initial encounter: Secondary | ICD-10-CM | POA: Diagnosis not present

## 2018-05-18 LAB — CBC WITH DIFFERENTIAL/PLATELET
ABS IMMATURE GRANULOCYTES: 0.11 10*3/uL — AB (ref 0.00–0.07)
ABS IMMATURE GRANULOCYTES: 0.05 10*3/uL (ref 0.00–0.07)
BASOS ABS: 0 10*3/uL (ref 0.0–0.1)
BASOS ABS: 0 10*3/uL (ref 0.0–0.1)
Basophils Relative: 0 %
Basophils Relative: 0 %
Eosinophils Absolute: 0 10*3/uL (ref 0.0–0.5)
Eosinophils Absolute: 0 10*3/uL (ref 0.0–0.5)
Eosinophils Relative: 0 %
Eosinophils Relative: 0 %
HCT: 36.4 % (ref 36.0–46.0)
HEMATOCRIT: 43.5 % (ref 36.0–46.0)
HEMOGLOBIN: 14.4 g/dL (ref 12.0–15.0)
Hemoglobin: 12.2 g/dL (ref 12.0–15.0)
IMMATURE GRANULOCYTES: 0 %
Immature Granulocytes: 1 %
LYMPHS ABS: 1.2 10*3/uL (ref 0.7–4.0)
LYMPHS PCT: 6 %
Lymphocytes Relative: 6 %
Lymphs Abs: 0.8 10*3/uL (ref 0.7–4.0)
MCH: 30.8 pg (ref 26.0–34.0)
MCH: 31 pg (ref 26.0–34.0)
MCHC: 33.1 g/dL (ref 30.0–36.0)
MCHC: 33.5 g/dL (ref 30.0–36.0)
MCV: 92.9 fL (ref 80.0–100.0)
MCV: 92.6 fL (ref 80.0–100.0)
Monocytes Absolute: 1.2 10*3/uL — ABNORMAL HIGH (ref 0.1–1.0)
Monocytes Absolute: 0.6 10*3/uL (ref 0.1–1.0)
Monocytes Relative: 6 %
Monocytes Relative: 5 %
NEUTROS ABS: 12.4 10*3/uL — AB (ref 1.7–7.7)
NEUTROS PCT: 89 %
NRBC: 0 % (ref 0.0–0.2)
NRBC: 0 % (ref 0.0–0.2)
Neutro Abs: 19 10*3/uL — ABNORMAL HIGH (ref 1.7–7.7)
Neutrophils Relative %: 87 %
Platelets: 277 10*3/uL (ref 150–400)
Platelets: 249 10*3/uL (ref 150–400)
RBC: 4.68 MIL/uL (ref 3.87–5.11)
RBC: 3.93 MIL/uL (ref 3.87–5.11)
RDW: 11.6 % (ref 11.5–15.5)
RDW: 11.6 % (ref 11.5–15.5)
WBC: 21.6 10*3/uL — ABNORMAL HIGH (ref 4.0–10.5)
WBC: 13.9 10*3/uL — AB (ref 4.0–10.5)

## 2018-05-18 LAB — URINALYSIS, ROUTINE W REFLEX MICROSCOPIC
BILIRUBIN URINE: NEGATIVE
GLUCOSE, UA: 50 mg/dL — AB
KETONES UR: 20 mg/dL — AB
NITRITE: NEGATIVE
PH: 5 (ref 5.0–8.0)
Protein, ur: 30 mg/dL — AB
SPECIFIC GRAVITY, URINE: 1.019 (ref 1.005–1.030)

## 2018-05-18 LAB — COMPREHENSIVE METABOLIC PANEL
ALBUMIN: 4.3 g/dL (ref 3.5–5.0)
ALT: 30 U/L (ref 0–44)
ANION GAP: 14 (ref 5–15)
AST: 42 U/L — AB (ref 15–41)
Alkaline Phosphatase: 53 U/L (ref 38–126)
BUN: 12 mg/dL (ref 6–20)
CHLORIDE: 102 mmol/L (ref 98–111)
CO2: 23 mmol/L (ref 22–32)
Calcium: 9.3 mg/dL (ref 8.9–10.3)
Creatinine, Ser: 0.94 mg/dL (ref 0.44–1.00)
GFR calc Af Amer: >60 mL/min (ref >60)
GFR calc non Af Amer: >60 mL/min (ref >60)
GLUCOSE: 93 mg/dL (ref 70–99)
POTASSIUM: 3.6 mmol/L (ref 3.5–5.1)
Sodium: 139 mmol/L (ref 135–145)
Total Bilirubin: 0.5 mg/dL (ref 0.3–1.2)
Total Protein: 7 g/dL (ref 6.5–8.1)

## 2018-05-18 LAB — I-STAT BETA HCG BLOOD, ED (MC, WL, AP ONLY): I-stat hCG, quantitative: <5 m[IU]/mL (ref <5)

## 2018-05-18 MED ORDER — ONDANSETRON HCL 4 MG/2ML IJ SOLN
4.0000 mg | INTRAMUSCULAR | Status: DC | PRN
  Administered 2018-05-18: 4 mg via INTRAVENOUS
  Filled 2018-05-18: qty 2

## 2018-05-18 MED ORDER — SODIUM CHLORIDE 0.9 % IV BOLUS
1000.0000 mL | Freq: Once | INTRAVENOUS | Status: AC
  Administered 2018-05-18: 1000 mL via INTRAVENOUS

## 2018-05-18 MED ORDER — NITROFURANTOIN MONOHYD MACRO 100 MG PO CAPS: 100.0000 mg | ORAL_CAPSULE | Freq: Two times a day (BID) | ORAL | 0 refills | Status: DC

## 2018-05-18 MED ORDER — ONDANSETRON HCL 4 MG/2ML IJ SOLN
4.0000 mg | Freq: Once | INTRAMUSCULAR | Status: AC
  Administered 2018-05-18: 4 mg via INTRAVENOUS
  Filled 2018-05-18: qty 2

## 2018-05-18 MED ORDER — SODIUM CHLORIDE 0.9 % IV BOLUS (SEPSIS)
1000.0000 mL | Freq: Once | INTRAVENOUS | Status: AC
  Administered 2018-05-18: 1000 mL via INTRAVENOUS

## 2018-05-18 MED ORDER — NALOXONE HCL 4 MG/0.1ML NA LIQD: NASAL | 1 refills | Status: DC

## 2018-05-18 NOTE — ED Triage Notes (Addendum)
Patient here from home, found by roommates on kitchen floor. Hx of narcotic abuse. Given 3mg  Narcan by friends. AAO x4. Uncooperative in transport. Given 2.5 Versed. Compliant. Admits to using heroin.

## 2018-05-18 NOTE — Discharge Instructions (Signed)
Substance Abuse Treatment Programs ° °Intensive Outpatient Programs °[facility]     °[address]      °[place], [place]                   °[phone]      ° °[facility] °[address] °[place], [place] °[phone] ° °[facility]     °(Inpatient and outpatient)     °[address]           °[phone]   ° °[facility] °[phone] (Suboxone and Methadone) ° °[address]      °[place], [place] [address]      °[phone]      ° °[address] °[place], [place] °852-3033 ° °[facility] (Outpatient/Inpatient, [facility])    °(insurance only) [phone]      °       °[facility] (Groups & Residential) °[place], [place] °[phone] ° °   °[facility]     °[address]     °[place], [place]      °[phone]      ° °[facility] (for caregivers and family) °[address] °[place], [place] °336-299-4655 ° ° ° ° ° °Residential Treatment Programs °[facility]      °[address], [place], [place] [address]  °[phone]      ° °[facility] °[address] [place], [place] [address] °[phone] ° °[facility]        °[phone]      ° °[facility] °[phone] ° °[facility]             °[address]                                         °[place], [place]                                                °[phone] or [phone]                              ° °[facility] [place] °[address] °[place], [address] °[phone] ° °[facility]    °[address]      °, [place]     °[phone]      ° °[facility] °[address] °, [place] °[phone] ° °[facility]   °[address]     °[place], [place] [address]     °[phone]      °Admissions: 8am-3pm M-F ° °[facility] °[address] °[place],  [place] °[phone] ° °[facility]: Residential Program (90 Days)   °[place], [place]      °[phone] or [phone]    ° °[facility] °[place], [place] °(Walk in Hours over the weekend or by referral) ° °[facility] °[address], [place], [place] [address] °([phone] ° °Crisis Mobile: [facility]:  1-877-626-1772 (for crisis response 24 hours a day) °[facility] Hotline:      1-800-256-2452 °Outpatient Psychiatry and Counseling ° °[facility]: Mobile Crisis   Management 24 hours:  1-579-867-5796  [facility] of the [employer] sliding scale fee and walk in schedule: M-F 8am-12pm/1pm-3pm [address]  [place], [place] [address] [place], [place] [address] [phone]  [facility] ([facility])- new patient walk-in appointments available Monday - Friday 8am -3pm.          [address] [place], [place] [address] [phone] or crisis line- [facility]/ Intensive Outpatient Therapy Program [place], [place] [address] [place]      [phone] [place], [place] [address]                 [place]   [facility] [phone] [place], [place] [address]   [facility]          [address] Johnette Abraham  [place], [place] [address]       [phone]  [facility] [address] [place], [place] [address] [phone]  [facility]    [address] [place], [place] [address]     [place], [place] Joycelyn Man     [place] [address]     [phone]       [facility] [place] [address]  [facility]     [address], [place], [place], [place] [address] [place]     [address]     [place], [place] [address]     [phone]       [facility] [address] [place], [place] [address] [phone] Resources for Temporary Residential Assistance/Crisis [facility]) M-F 8am-3pm   [address] [place], [place] [address]   [phone] Services include: laundry, barbering, support groups, case management, phone  & computer access, showers, AA/NA mtgs, mental health/substance abuse nurse, job skills class, disability information, VA assistance, spiritual classes, etc.   [facility]   [address], [place]     [place]              [employer] (women and children)       [place]. [place], [place] [address] [phone] [email or web address] for application and process Application Required  [facility]   [address]    [place] [address]     [phone]                    [place],  [address] [phone](OILNZVJK application appt.) Application Required  [facility] (women only)    [address]     [place],  [address]     [phone]  Intake starts 6pm daily Need valid ID, SSC, & Police report [employer] [address] [place], [place] [phone] Application Required  [facility] (men only)     [address]      [place], [place]     [phone]       [facility] of the [place] (Pregnant women only) [address] [place], [place] [phone]  [facility]      [address]      [place], [place] [address]     [phone]             [facility] [address] [place], [place] [phone] 90 day commitment/SA/Application process  [facility](men only)     [address]     [place], [place]     [phone]       Check-in at [facility] [address] [place], [place] [address] [phone] Men/Women/Women and Children must be there by 7 pm  [facility] [place], [place] [phone]                  Take your usual prescriptions as previously directed.  Call your regular medical doctor today to schedule a follow up appointment within the next week. Call the outpatient resources given to you today if you are interested in detox programs.  Return to the Emergency Department immediately sooner if worsening.

## 2018-05-18 NOTE — ED Notes (Signed)
Patient given meal tray. Per MD patient can eat.

## 2018-05-18 NOTE — ED Provider Notes (Signed)
Pt received at sign out with IVF bolus and CBC, UA, CXR pending. After IVF boluses, pt's WBC count trending downward, tachycardia improving.  +UTI on Udip, CXR reassuring. Pt awake/alert, resps easy, ambulatory with steady gait. Pt has tol PO well without further N/V. No report SI. Pt states she would like to go home now. Will d/c stable with outpt resources.    Patient Vitals for the past 24 hrs:  BP Temp Temp src Pulse Resp SpO2  05/18/18 1130 98/64 - - 61 19 93 %  05/18/18 1030 116/72 - - 91 14 97 %  05/18/18 1000 (!) 100/45 - - (!) 103 14 93 %  05/18/18 0915 (!) 100/49 - - (!) 101 15 95 %  05/18/18 0830 (!) 98/45 - - (!) 101 14 97 %  05/18/18 0823 (!) 101/48 - - (!) 108 11 92 %  05/18/18 0552 101/79 - - (!) 113 14 95 %  05/18/18 0456 101/76 97.9 F (36.6 C) Oral (!) 124 19 100 %   Results for orders placed or performed during the hospital encounter of 05/18/18  Comprehensive metabolic panel  Result Value Ref Range   Sodium 139 135 - 145 mmol/L   Potassium 3.6 3.5 - 5.1 mmol/L   Chloride 102 98 - 111 mmol/L   CO2 23 22 - 32 mmol/L   Glucose, Bld 93 70 - 99 mg/dL   BUN 12 6 - 20 mg/dL   Creatinine, Ser 1.61 0.44 - 1.00 mg/dL   Calcium 9.3 8.9 - 09.6 mg/dL   Total Protein 7.0 6.5 - 8.1 g/dL   Albumin 4.3 3.5 - 5.0 g/dL   AST 42 (H) 15 - 41 U/L   ALT 30 0 - 44 U/L   Alkaline Phosphatase 53 38 - 126 U/L   Total Bilirubin 0.5 0.3 - 1.2 mg/dL   GFR calc non Af Amer >60 >60 mL/min   GFR calc Af Amer >60 >60 mL/min   Anion gap 14 5 - 15  CBC with Differential/Platelet  Result Value Ref Range   WBC 21.6 (H) 4.0 - 10.5 K/uL   RBC 4.68 3.87 - 5.11 MIL/uL   Hemoglobin 14.4 12.0 - 15.0 g/dL   HCT 04.5 40.9 - 81.1 %   MCV 92.9 80.0 - 100.0 fL   MCH 30.8 26.0 - 34.0 pg   MCHC 33.1 30.0 - 36.0 g/dL   RDW 91.4 78.2 - 95.6 %   Platelets 277 150 - 400 K/uL   nRBC 0.0 0.0 - 0.2 %   Neutrophils Relative % 87 %   Neutro Abs 19.0 (H) 1.7 - 7.7 K/uL   Lymphocytes Relative 6 %   Lymphs  Abs 1.2 0.7 - 4.0 K/uL   Monocytes Relative 6 %   Monocytes Absolute 1.2 (H) 0.1 - 1.0 K/uL   Eosinophils Relative 0 %   Eosinophils Absolute 0.0 0.0 - 0.5 K/uL   Basophils Relative 0 %   Basophils Absolute 0.0 0.0 - 0.1 K/uL   Immature Granulocytes 1 %   Abs Immature Granulocytes 0.11 (H) 0.00 - 0.07 K/uL  CBC with Differential  Result Value Ref Range   WBC 13.9 (H) 4.0 - 10.5 K/uL   RBC 3.93 3.87 - 5.11 MIL/uL   Hemoglobin 12.2 12.0 - 15.0 g/dL   HCT 21.3 08.6 - 57.8 %   MCV 92.6 80.0 - 100.0 fL   MCH 31.0 26.0 - 34.0 pg   MCHC 33.5 30.0 - 36.0 g/dL   RDW 46.9 62.9 -  15.5 %   Platelets 249 150 - 400 K/uL   nRBC 0.0 0.0 - 0.2 %   Neutrophils Relative % 89 %   Neutro Abs 12.4 (H) 1.7 - 7.7 K/uL   Lymphocytes Relative 6 %   Lymphs Abs 0.8 0.7 - 4.0 K/uL   Monocytes Relative 5 %   Monocytes Absolute 0.6 0.1 - 1.0 K/uL   Eosinophils Relative 0 %   Eosinophils Absolute 0.0 0.0 - 0.5 K/uL   Basophils Relative 0 %   Basophils Absolute 0.0 0.0 - 0.1 K/uL   Immature Granulocytes 0 %   Abs Immature Granulocytes 0.05 0.00 - 0.07 K/uL  Urinalysis, Routine w reflex microscopic  Result Value Ref Range   Color, Urine YELLOW (A) YELLOW   APPearance CLOUDY (A) CLEAR   Specific Gravity, Urine 1.019 1.005 - 1.030   pH 5.0 5.0 - 8.0   Glucose, UA 50 (A) NEGATIVE mg/dL   Hgb urine dipstick SMALL (A) NEGATIVE   Bilirubin Urine NEGATIVE NEGATIVE   Ketones, ur 20 (A) NEGATIVE mg/dL   Protein, ur 30 (A) NEGATIVE mg/dL   Nitrite NEGATIVE NEGATIVE   Leukocytes, UA MODERATE (A) NEGATIVE   RBC / HPF 11-20 0 - 5 RBC/hpf   WBC, UA 21-50 0 - 5 WBC/hpf   Bacteria, UA MANY (A) NONE SEEN   Squamous Epithelial / LPF 11-20 0 - 5   Mucus PRESENT   I-Stat Beta hCG blood, ED ([facility], [facility], [facility] only)  Result Value Ref Range   I-stat hCG, quantitative <5.0 <5 mIU/mL   Comment 3           Dg Chest Port 1 View Result Date: 05/18/2018 CLINICAL DATA:  Tachycardia. EXAM: PORTABLE CHEST 1 VIEW COMPARISON:  None.  FINDINGS: The heart size and mediastinal contours are within normal limits. Both lungs are clear. The visualized skeletal structures are unremarkable. IMPRESSION: No active disease. Electronically Signed   By: Elberta Fortis M.D.   On: 05/18/2018 07:59      Kara Jester, DO 05/18/18 1348

## 2018-05-18 NOTE — ED Notes (Signed)
Bed: ZH08 Expected date:  Expected time:  Means of arrival:  Comments: Overdose, uncooperative

## 2018-05-18 NOTE — ED Provider Notes (Signed)
[facility]-EMERGENCY DEPT Provider Note   CSN: [id number] Arrival date & time: 05/18/18  0456     History   Chief Complaint Chief Complaint  Patient presents with  . Drug Overdose    HPI Kara Walker is a 27 y.o. female.  The history is provided by the patient and the EMS personnel.  Drug Overdose  This is a new problem. The problem occurs constantly. The problem has been gradually improving. Associated symptoms comments: Vomiting . Nothing aggravates the symptoms. Nothing relieves the symptoms.   Patient presents after heroin overdose. Per EMS, she was found on the floor by roommates.  She was given Narcan and she woke up.  She now reports vomiting.  She denies any other drug abuse except for heroin. Past Medical History:  Diagnosis Date  . Depression   . Hepatitis C   . IV drug user   . UTI (urinary tract infection)     Patient Active Problem List   Diagnosis Date Noted  . Opioid dependence (HCC) 01/26/2012    Past Surgical History:  Procedure Laterality Date  . INDUCED ABORTION    . NO PAST SURGERIES       OB History    Gravida  0   Para      Term      Preterm      AB      Living        SAB      TAB      Ectopic      Multiple      Live Births               Home Medications    Prior to Admission medications   Medication Sig Start Date End Date Taking? Authorizing Provider  LINZESS 290 MCG CAPS capsule TAKE 1 CAPSULE(290 MCG) BY MOUTH DAILY BEFORE BREAKFAST 05/14/18   Meryl Dare, MD    Family History Family History  Problem Relation Age of Onset  . Cancer Paternal Grandmother        type unknown    Social History Social History   Tobacco Use  . Smoking status: Former Smoker    Types: Cigarettes    Last attempt to quit: 04/14/2011    Years since quitting: 7.0  . Smokeless tobacco: Never Used  Substance Use Topics  . Alcohol use: No  . Drug use: No    Frequency: 7.0 times per week    Types:  Heroin, "Crack" cocaine    Comment: Previous IV drug user     Allergies   Patient has no known allergies.   Review of Systems Review of Systems  Constitutional: Negative for fever.  Gastrointestinal: Positive for vomiting.  Psychiatric/Behavioral: The patient is nervous/anxious.   All other systems reviewed and are negative.    Physical Exam Updated Vital Signs BP 101/76   Pulse (!) 124   Temp 97.9 F (36.6 C) (Oral)   Resp 19   SpO2 100%   Physical Exam CONSTITUTIONAL: Disheveled, actively vomiting HEAD: Normocephalic/atraumatic EYES: EOMI/PERRL, pupils not pinpoint ENMT: Mucous membranes moist NECK: supple no meningeal signs SPINE/BACK:entire spine nontender CV: S1/S2 noted, no murmurs/rubs/gallops noted LUNGS: Lungs are clear to auscultation bilaterally, no apparent distress ABDOMEN: soft, nontender NEURO: Pt is awake/alert/appropriate, moves all extremitiesx4.  No facial droop.   EXTREMITIES: pulses normal/equal, full ROM SKIN: warm, color normal PSYCH: Anxious  ED Treatments / Results  Labs (all labs ordered are listed, but only abnormal results  are displayed) Labs Reviewed  COMPREHENSIVE METABOLIC PANEL - Abnormal; Notable for the following components:      Result Value   AST 42 (*)    All other components within normal limits  CBC WITH DIFFERENTIAL/PLATELET - Abnormal; Notable for the following components:   WBC 21.6 (*)    Neutro Abs 19.0 (*)    Monocytes Absolute 1.2 (*)    Abs Immature Granulocytes 0.11 (*)    All other components within normal limits  I-STAT BETA HCG BLOOD, ED ([facility], [facility], [facility] ONLY)    EKG EKG Interpretation  Date/Time:  Friday May 18 2018 04:59:29 EST Ventricular Rate:  118 PR Interval:    QRS Duration: 97 QT Interval:  319 QTC Calculation: 447 R Axis:   84 Text Interpretation:  Sinus tachycardia Borderline Q waves in lateral leads Borderline repolarization abnormality Interpretation limited secondary to artifact No  previous ECGs available Confirmed by Zadie Rhine ([id number]) on 05/18/2018 5:02:00 AM   Radiology No results found.  Procedures Procedures    Medications Ordered in ED Medications  ondansetron (ZOFRAN) injection 4 mg (4 mg Intravenous Given 05/18/18 0550)  sodium chloride 0.9 % bolus 1,000 mL (1,000 mLs Intravenous New Bag/Given 05/18/18 0550)     Initial Impression / Assessment and Plan / ED Course  I have reviewed the triage vital signs and the nursing notes.  Pertinent labs  results that were available during my care of the patient were reviewed by me and considered in my medical decision making (see chart for details).     6:21 AM Presents after apparent heroin overdose Denies IVDA, she reports she snorts heroin. She reports she has overdosed before and has been given Narcan 1 other  time.  She was unclear of the details of what happened tonight, but apparently was found on the floor of her kitchen by her roommates. Her vomiting is now improved.  Labs are pending at this time. 6:45 AM Patient improved.  Vitals are improving. She does have elevated white count, but afebrile and no other acute complaints. Patient will need to be monitored for up to 4 hours.  7:10 AM Pt signed out to Dr. Clarene Duke, pt will need to be monitored and if no worsening symptoms  Can be discharged.   Final Clinical Impressions(s) / ED Diagnoses   Final diagnoses:  Accidental overdose of heroin, initial encounter [facility])    ED Discharge Orders         Ordered    naloxone [facility]) nasal spray 4 mg/0.1 mL     05/18/18 0645           Zadie Rhine, MD 05/18/18 [phone]

## 2018-05-22 DIAGNOSIS — Z1389 Encounter for screening for other disorder: Secondary | ICD-10-CM | POA: Diagnosis not present

## 2018-05-22 DIAGNOSIS — F4322 Adjustment disorder with anxiety: Secondary | ICD-10-CM | POA: Diagnosis not present

## 2018-07-09 DIAGNOSIS — F4322 Adjustment disorder with anxiety: Secondary | ICD-10-CM | POA: Diagnosis not present

## 2018-07-12 DIAGNOSIS — F4322 Adjustment disorder with anxiety: Secondary | ICD-10-CM | POA: Diagnosis not present

## 2018-07-17 DIAGNOSIS — F4322 Adjustment disorder with anxiety: Secondary | ICD-10-CM | POA: Diagnosis not present

## 2018-07-24 DIAGNOSIS — F4322 Adjustment disorder with anxiety: Secondary | ICD-10-CM | POA: Diagnosis not present

## 2018-07-25 DIAGNOSIS — F4322 Adjustment disorder with anxiety: Secondary | ICD-10-CM | POA: Diagnosis not present

## 2018-07-31 ENCOUNTER — Other Ambulatory Visit: Payer: Self-pay | Admitting: Gastroenterology

## 2018-07-31 DIAGNOSIS — F4322 Adjustment disorder with anxiety: Secondary | ICD-10-CM | POA: Diagnosis not present

## 2018-08-02 DIAGNOSIS — F4322 Adjustment disorder with anxiety: Secondary | ICD-10-CM | POA: Diagnosis not present

## 2018-08-06 ENCOUNTER — Telehealth: Payer: Self-pay | Admitting: Gastroenterology

## 2018-08-06 NOTE — Telephone Encounter (Signed)
[employer] calling for PA for Linzess.

## 2018-08-06 NOTE — Telephone Encounter (Signed)
Received faxed denial for Linzess from [facility] stating medication is not on formulary and two alternative medications have been tried and did not work. There is only on alternative medication patient has not tried: Trulance. Can patient be switched to Trulance Dr. Russella Dar?

## 2018-08-06 NOTE — Telephone Encounter (Signed)
Left message for patient to return my call.

## 2018-08-06 NOTE — Telephone Encounter (Signed)
Trulance 3 mg po qd, 1 year of refills 

## 2018-08-07 NOTE — Telephone Encounter (Signed)
Left a message for patient to return my call. 

## 2018-08-08 DIAGNOSIS — F4322 Adjustment disorder with anxiety: Secondary | ICD-10-CM | POA: Diagnosis not present

## 2018-08-14 DIAGNOSIS — F4322 Adjustment disorder with anxiety: Secondary | ICD-10-CM | POA: Diagnosis not present

## 2018-08-21 DIAGNOSIS — F4322 Adjustment disorder with anxiety: Secondary | ICD-10-CM | POA: Diagnosis not present

## 2018-08-22 DIAGNOSIS — F321 Major depressive disorder, single episode, moderate: Secondary | ICD-10-CM | POA: Diagnosis not present

## 2018-08-30 DIAGNOSIS — F4322 Adjustment disorder with anxiety: Secondary | ICD-10-CM | POA: Diagnosis not present

## 2018-09-04 DIAGNOSIS — F321 Major depressive disorder, single episode, moderate: Secondary | ICD-10-CM | POA: Diagnosis not present

## 2018-09-07 DIAGNOSIS — F4322 Adjustment disorder with anxiety: Secondary | ICD-10-CM | POA: Diagnosis not present

## 2018-09-11 DIAGNOSIS — F4322 Adjustment disorder with anxiety: Secondary | ICD-10-CM | POA: Diagnosis not present

## 2018-09-19 DIAGNOSIS — F321 Major depressive disorder, single episode, moderate: Secondary | ICD-10-CM | POA: Diagnosis not present

## 2018-09-27 DIAGNOSIS — Z1389 Encounter for screening for other disorder: Secondary | ICD-10-CM | POA: Diagnosis not present

## 2018-09-27 DIAGNOSIS — F4322 Adjustment disorder with anxiety: Secondary | ICD-10-CM | POA: Diagnosis not present

## 2018-10-02 DIAGNOSIS — F4322 Adjustment disorder with anxiety: Secondary | ICD-10-CM | POA: Diagnosis not present

## 2018-10-05 DIAGNOSIS — F909 Attention-deficit hyperactivity disorder, unspecified type: Secondary | ICD-10-CM | POA: Diagnosis not present

## 2018-10-05 DIAGNOSIS — F1199 Opioid use, unspecified with unspecified opioid-induced disorder: Secondary | ICD-10-CM | POA: Diagnosis not present

## 2018-10-05 DIAGNOSIS — F411 Generalized anxiety disorder: Secondary | ICD-10-CM | POA: Diagnosis not present

## 2018-10-09 DIAGNOSIS — F4322 Adjustment disorder with anxiety: Secondary | ICD-10-CM | POA: Diagnosis not present

## 2018-10-16 DIAGNOSIS — F4322 Adjustment disorder with anxiety: Secondary | ICD-10-CM | POA: Diagnosis not present

## 2018-10-23 DIAGNOSIS — F4322 Adjustment disorder with anxiety: Secondary | ICD-10-CM | POA: Diagnosis not present

## 2018-10-25 DIAGNOSIS — F4322 Adjustment disorder with anxiety: Secondary | ICD-10-CM | POA: Diagnosis not present

## 2018-11-16 DIAGNOSIS — F4322 Adjustment disorder with anxiety: Secondary | ICD-10-CM | POA: Diagnosis not present

## 2018-11-21 DIAGNOSIS — F4322 Adjustment disorder with anxiety: Secondary | ICD-10-CM | POA: Diagnosis not present

## 2018-11-23 DIAGNOSIS — F4322 Adjustment disorder with anxiety: Secondary | ICD-10-CM | POA: Diagnosis not present

## 2018-11-30 DIAGNOSIS — F4322 Adjustment disorder with anxiety: Secondary | ICD-10-CM | POA: Diagnosis not present

## 2018-12-04 DIAGNOSIS — F4322 Adjustment disorder with anxiety: Secondary | ICD-10-CM | POA: Diagnosis not present

## 2018-12-21 ENCOUNTER — Other Ambulatory Visit: Payer: Self-pay

## 2018-12-21 DIAGNOSIS — Z87891 Personal history of nicotine dependence: Secondary | ICD-10-CM | POA: Diagnosis not present

## 2018-12-21 DIAGNOSIS — F11288 Opioid dependence with other opioid-induced disorder: Secondary | ICD-10-CM | POA: Diagnosis not present

## 2018-12-21 DIAGNOSIS — L03114 Cellulitis of left upper limb: Secondary | ICD-10-CM | POA: Insufficient documentation

## 2018-12-21 DIAGNOSIS — Z79899 Other long term (current) drug therapy: Secondary | ICD-10-CM | POA: Insufficient documentation

## 2018-12-21 DIAGNOSIS — F1924 Other psychoactive substance dependence with psychoactive substance-induced mood disorder: Secondary | ICD-10-CM | POA: Diagnosis not present

## 2018-12-21 DIAGNOSIS — L02512 Cutaneous abscess of left hand: Secondary | ICD-10-CM | POA: Diagnosis not present

## 2018-12-21 DIAGNOSIS — F1120 Opioid dependence, uncomplicated: Secondary | ICD-10-CM | POA: Insufficient documentation

## 2018-12-21 DIAGNOSIS — L03113 Cellulitis of right upper limb: Secondary | ICD-10-CM | POA: Diagnosis not present

## 2018-12-21 DIAGNOSIS — Z046 Encounter for general psychiatric examination, requested by authority: Secondary | ICD-10-CM | POA: Diagnosis present

## 2018-12-21 DIAGNOSIS — Z03818 Encounter for observation for suspected exposure to other biological agents ruled out: Secondary | ICD-10-CM | POA: Diagnosis not present

## 2018-12-21 DIAGNOSIS — R259 Unspecified abnormal involuntary movements: Secondary | ICD-10-CM | POA: Diagnosis not present

## 2018-12-21 DIAGNOSIS — F1110 Opioid abuse, uncomplicated: Secondary | ICD-10-CM | POA: Diagnosis not present

## 2018-12-21 DIAGNOSIS — R45851 Suicidal ideations: Secondary | ICD-10-CM | POA: Diagnosis not present

## 2018-12-21 DIAGNOSIS — F1910 Other psychoactive substance abuse, uncomplicated: Secondary | ICD-10-CM | POA: Diagnosis not present

## 2018-12-22 ENCOUNTER — Emergency Department (HOSPITAL_COMMUNITY)
Admission: EM | Admit: 2018-12-22 | Discharge: 2018-12-22 | Disposition: A | Discharge status: Other Acute Inpatient Hospital | Payer: BC Managed Care – PPO | Attending: Emergency Medicine | Admitting: Emergency Medicine

## 2018-12-22 ENCOUNTER — Emergency Department (EMERGENCY_DEPARTMENT_HOSPITAL)
Admission: EM | Admit: 2018-12-22 | Discharge: 2018-12-23 | Disposition: A | Discharge status: Home / Self Care | Payer: BC Managed Care – PPO | Source: Home / Self Care | Attending: Emergency Medicine | Admitting: Emergency Medicine

## 2018-12-22 ENCOUNTER — Encounter (HOSPITAL_COMMUNITY): Payer: Self-pay | Admitting: *Deleted

## 2018-12-22 ENCOUNTER — Encounter (HOSPITAL_COMMUNITY): Payer: Self-pay | Admitting: Emergency Medicine

## 2018-12-22 ENCOUNTER — Other Ambulatory Visit: Payer: Self-pay

## 2018-12-22 DIAGNOSIS — F1120 Opioid dependence, uncomplicated: Secondary | ICD-10-CM | POA: Insufficient documentation

## 2018-12-22 DIAGNOSIS — F1924 Other psychoactive substance dependence with psychoactive substance-induced mood disorder: Secondary | ICD-10-CM | POA: Insufficient documentation

## 2018-12-22 DIAGNOSIS — R259 Unspecified abnormal involuntary movements: Secondary | ICD-10-CM | POA: Diagnosis not present

## 2018-12-22 DIAGNOSIS — F1910 Other psychoactive substance abuse, uncomplicated: Secondary | ICD-10-CM

## 2018-12-22 DIAGNOSIS — R45851 Suicidal ideations: Secondary | ICD-10-CM

## 2018-12-22 DIAGNOSIS — Z87891 Personal history of nicotine dependence: Secondary | ICD-10-CM | POA: Insufficient documentation

## 2018-12-22 DIAGNOSIS — Z79899 Other long term (current) drug therapy: Secondary | ICD-10-CM | POA: Insufficient documentation

## 2018-12-22 DIAGNOSIS — L03114 Cellulitis of left upper limb: Secondary | ICD-10-CM

## 2018-12-22 DIAGNOSIS — F1994 Other psychoactive substance use, unspecified with psychoactive substance-induced mood disorder: Secondary | ICD-10-CM | POA: Diagnosis present

## 2018-12-22 DIAGNOSIS — L0291 Cutaneous abscess, unspecified: Secondary | ICD-10-CM

## 2018-12-22 DIAGNOSIS — Z046 Encounter for general psychiatric examination, requested by authority: Secondary | ICD-10-CM

## 2018-12-22 DIAGNOSIS — Z03818 Encounter for observation for suspected exposure to other biological agents ruled out: Secondary | ICD-10-CM | POA: Insufficient documentation

## 2018-12-22 DIAGNOSIS — L02512 Cutaneous abscess of left hand: Secondary | ICD-10-CM | POA: Diagnosis not present

## 2018-12-22 LAB — RAPID URINE DRUG SCREEN, HOSP PERFORMED
Amphetamines: POSITIVE — AB
Barbiturates: NOT DETECTED
Benzodiazepines: POSITIVE — AB
Cocaine: POSITIVE — AB
Opiates: POSITIVE — AB
Tetrahydrocannabinol: NOT DETECTED

## 2018-12-22 LAB — COMPREHENSIVE METABOLIC PANEL
ALT: 19 U/L (ref 0–44)
AST: 26 U/L (ref 15–41)
Albumin: 3.8 g/dL (ref 3.5–5.0)
Alkaline Phosphatase: 59 U/L (ref 38–126)
Anion gap: 12 (ref 5–15)
BUN: 9 mg/dL (ref 6–20)
CO2: 25 mmol/L (ref 22–32)
Calcium: 9.5 mg/dL (ref 8.9–10.3)
Chloride: 101 mmol/L (ref 98–111)
Creatinine, Ser: 0.87 mg/dL (ref 0.44–1.00)
GFR calc Af Amer: >60 mL/min (ref >60)
GFR calc non Af Amer: >60 mL/min (ref >60)
Glucose, Bld: 140 mg/dL — ABNORMAL HIGH (ref 70–99)
Potassium: 3.4 mmol/L — ABNORMAL LOW (ref 3.5–5.1)
Sodium: 138 mmol/L (ref 135–145)
Total Bilirubin: 0.7 mg/dL (ref 0.3–1.2)
Total Protein: 6.9 g/dL (ref 6.5–8.1)

## 2018-12-22 LAB — ETHANOL: Alcohol, Ethyl (B): <10 mg/dL (ref <10)

## 2018-12-22 LAB — CBC
HCT: 38.8 % (ref 36.0–46.0)
Hemoglobin: 12.8 g/dL (ref 12.0–15.0)
MCH: 28.8 pg (ref 26.0–34.0)
MCHC: 33 g/dL (ref 30.0–36.0)
MCV: 87.2 fL (ref 80.0–100.0)
Platelets: 222 10*3/uL (ref 150–400)
RBC: 4.45 MIL/uL (ref 3.87–5.11)
RDW: 11.3 % — ABNORMAL LOW (ref 11.5–15.5)
WBC: 6 10*3/uL (ref 4.0–10.5)
nRBC: 0 % (ref 0.0–0.2)

## 2018-12-22 LAB — I-STAT BETA HCG BLOOD, ED (MC, WL, AP ONLY): I-stat hCG, quantitative: <5 m[IU]/mL (ref <5)

## 2018-12-22 MED ORDER — CEPHALEXIN 250 MG PO CAPS
500.0000 mg | ORAL_CAPSULE | Freq: Once | ORAL | Status: AC
  Administered 2018-12-22: 500 mg via ORAL
  Filled 2018-12-22: qty 2

## 2018-12-22 MED ORDER — CEPHALEXIN 500 MG PO CAPS: 500.0000 mg | ORAL_CAPSULE | Freq: Four times a day (QID) | ORAL | 0 refills | Status: AC

## 2018-12-22 NOTE — ED Notes (Signed)
Results printed and to be sent with pt to [facility] per their request

## 2018-12-22 NOTE — ED Notes (Signed)
Patient verbalizes understanding of discharge instructions. Opportunity for questioning and answers were provided. Armband removed by staff, pt discharged from ED.  
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
## 2018-12-22 NOTE — ED Provider Notes (Signed)
[facility] Note   CSN: [id number] Arrival date & time: 12/21/18  2351     History   Chief Complaint Chief Complaint  Patient presents with   Labs Only    HPI Kara BarcelonaSara Riggins is a 28 y.o. female.     Patient to ED with [facility] for medical clearance [facility]. Per IVC petition, she has been using heroin and meth by injection and driving her car around [place] with suicidal ideation. The patient does not contribute to history. She does state she has some discomfort in the left arm where she attempted to inject drugs and missed the vein.   The history is provided by the patient. No language interpreter was used.    Past Medical History:  Diagnosis Date   Depression    Hepatitis C    IV drug user    UTI (urinary tract infection)     Patient Active Problem List   Diagnosis Date Noted   Opioid dependence (HCC) 01/26/2012    Past Surgical History:  Procedure Laterality Date   INDUCED ABORTION     NO PAST SURGERIES       OB History    Gravida  0   Para      Term      Preterm      AB      Living        SAB      TAB      Ectopic      Multiple      Live Births               Home Medications    Prior to Admission medications   Medication Sig Start Date End Date Taking? Authorizing Provider  doxycycline (MONODOX) 100 MG capsule Take 100 mg by mouth 2 (two) times daily. 05/14/18   [provider]  ibuprofen (ADVIL,MOTRIN) 200 MG tablet Take 600-800 mg by mouth every 8 (eight) hours as needed for headache or mild pain.    [provider]  levonorgestrel (MIRENA) 20 MCG/24HR IUD 1 each by Intrauterine route once.    [provider]  LINZESS 290 MCG CAPS capsule TAKE 1 CAPSULE(290 MCG) BY MOUTH DAILY BEFORE BREAKFAST 07/31/18   Meryl DareStark, Malcolm T, MD  naloxone [facility](NARCAN) nasal spray 4 mg/0.1 mL Spray in nose for drug overdose 05/18/18   Zadie RhineWickline,  Donald, MD  nitrofurantoin, macrocrystal-monohydrate, (MACROBID) 100 MG capsule Take 1 capsule (100 mg total) by mouth 2 (two) times daily. 05/18/18   Samuel JesterMcManus, Kathleen, DO    Family History Family History  Problem Relation Age of Onset   Cancer Paternal Grandmother        type unknown    Social History Social History   Tobacco Use   Smoking status: Former Smoker    Types: Cigarettes    Quit date: 04/14/2011    Years since quitting: 7.6   Smokeless tobacco: Never Used  Substance Use Topics   Alcohol use: No   Drug use: Yes    Frequency: 7.0 times per week    Types: Heroin, "Crack" cocaine, IV    Comment: heroin, meth     Allergies   Patient has no known allergies.   Review of Systems Review of Systems  Reason unable to perform ROS: ROS limited by lack of patient participation.  Musculoskeletal:       See HPI.  Skin: Positive for color change.  Psychiatric/Behavioral: Positive for  suicidal ideas.     Physical Exam Updated Vital Signs BP 105/80    Pulse 94    Temp 98 F (36.7 C) (Oral)    Resp 16    SpO2 99%   Physical Exam Vitals signs and nursing note reviewed.  Constitutional:      General: She is not in acute distress.    Appearance: She is well-developed.     Comments: Patient is restless, unable to sit still for long periods.   HENT:     Head: Normocephalic and atraumatic.     Comments: Multiple facial sores, partially scabbed. No evidence abscess or infection.  Neck:     Musculoskeletal: Normal range of motion and neck supple.  Cardiovascular:     Rate and Rhythm: Normal rate and regular rhythm.  Pulmonary:     Effort: Pulmonary effort is normal.     Breath sounds: Normal breath sounds.  Abdominal:     General: Bowel sounds are normal.     Palpations: Abdomen is soft.     Tenderness: There is no abdominal tenderness. There is no guarding or rebound.  Musculoskeletal: Normal range of motion.  Skin:    General: Skin is warm and dry.      Comments: Multiple injection sites to volar and posterior left forearm. There is mild redness surrounding posterior sites without induration or evidence of abscess formation. No streaking. There is mild swelling of the injection site in the [facility]. Minimally tender.   Neurological:     Cranial Nerves: No cranial nerve deficit.      ED Treatments / Results  Labs (all labs ordered are listed, but only abnormal results are displayed) Labs Reviewed  COMPREHENSIVE METABOLIC PANEL - Abnormal; Notable for the following components:      Result Value   Potassium 3.4 (*)    Glucose, Bld 140 (*)    All other components within normal limits  CBC - Abnormal; Notable for the following components:   RDW 11.3 (*)    All other components within normal limits  RAPID URINE DRUG SCREEN, HOSP PERFORMED - Abnormal; Notable for the following components:   Opiates POSITIVE (*)    Cocaine POSITIVE (*)    Benzodiazepines POSITIVE (*)    Amphetamines POSITIVE (*)    All other components within normal limits  ETHANOL  I-STAT BETA HCG BLOOD, ED ([facility], [facility], [facility] ONLY)   Results for orders placed or performed during the hospital encounter of 12/22/18  Comprehensive metabolic panel  Result Value Ref Range   Sodium 138 135 - 145 mmol/L   Potassium 3.4 (L) 3.5 - 5.1 mmol/L   Chloride 101 98 - 111 mmol/L   CO2 25 22 - 32 mmol/L   Glucose, Bld 140 (H) 70 - 99 mg/dL   BUN 9 6 - 20 mg/dL   Creatinine, Ser 4.090.87 0.44 - 1.00 mg/dL   Calcium 9.5 8.9 - 81.110.3 mg/dL   Total Protein 6.9 6.5 - 8.1 g/dL   Albumin 3.8 3.5 - 5.0 g/dL   AST 26 15 - 41 U/L   ALT 19 0 - 44 U/L   Alkaline Phosphatase 59 38 - 126 U/L   Total Bilirubin 0.7 0.3 - 1.2 mg/dL   GFR calc non Af Amer >60 >60 mL/min   GFR calc Af Amer >60 >60 mL/min   Anion gap 12 5 - 15  Ethanol  Result Value Ref Range   Alcohol, Ethyl (B) <10 <10 mg/dL  cbc  Result Value Ref  Range   WBC 6.0 4.0 - 10.5 K/uL   RBC 4.45 3.87 - 5.11 MIL/uL   Hemoglobin 12.8 12.0 - 15.0  g/dL   HCT 38.8 36.0 - 46.0 %   MCV 87.2 80.0 - 100.0 fL   MCH 28.8 26.0 - 34.0 pg   MCHC 33.0 30.0 - 36.0 g/dL   RDW 11.3 (L) 11.5 - 15.5 %   Platelets 222 150 - 400 K/uL   nRBC 0.0 0.0 - 0.2 %  Rapid urine drug screen (hospital performed)  Result Value Ref Range   Opiates POSITIVE (A) NONE DETECTED   Cocaine POSITIVE (A) NONE DETECTED   Benzodiazepines POSITIVE (A) NONE DETECTED   Amphetamines POSITIVE (A) NONE DETECTED   Tetrahydrocannabinol NONE DETECTED NONE DETECTED   Barbiturates NONE DETECTED NONE DETECTED  I-Stat beta hCG blood, ED  Result Value Ref Range   I-stat hCG, quantitative <5.0 <5 mIU/mL   Comment 3             EKG    Radiology No results found.  Procedures Procedures (including critical care time)  Medications Ordered in ED Medications - No data to display   Initial Impression / Assessment and Plan / ED Course  I have reviewed the triage vital signs and the nursing notes.  Pertinent labs & imaging results that were available during my care of the patient were reviewed by me and considered in my medical decision making (see chart for details).        Patient to ED under IVC petition with [employer]. She has been accepted to [facility] for treatment and needs medical clearance.   VSS. No fever. Suspect early or mild cellulitis of left posterior forearm injection sites. No leukocytosis or abscess. She is started on Kelfex here and a prescription will be provided to continue at [facility].   Labs remarkable for multiple substances in her UDS. She can be discharged and taken by [facility] to [facility].   Final Clinical Impressions(s) / ED Diagnoses   Final diagnoses:  None   1. Polysubstance abuse 2. SI 3. Mild left UE cellulitis  ED Discharge Orders    None       Charlann Lange, PA-C 12/22/18 [id number]    Veryl Speak, MD 12/22/18 2303

## 2018-12-22 NOTE — ED Triage Notes (Signed)
Pt brought in by [employer] under IVC for lab work. Per paperwork, pt has been using IV drugs (heroin, meth) and etoh and driving around  trying to harm herself. Pt denies SI at present. Reports recent IV use to L arm where she missed the vein and has swelling to area. Pt has been accepted to [facility], just needs lab work.

## 2018-12-22 NOTE — ED Provider Notes (Signed)
[facility]   CSN: [id number] Arrival date & time: 12/22/18  1905    History   Chief Complaint Chief Complaint  Patient presents with  . Suicidal    IVC  . Drug Problem    HPI Kara Walker is a 28 y.o. female with PMHx IVDA and depression who presents with [employer] officers from [facility]. Pt was seen in the ED less than 24 hours ago for medical clearance prior to [facility]. [facility] and [facility]. [facility] reports [facility] called them and told them patient needs to return to the ED but did not give them any reasons. Pt states she does not want inpatient treatment.        Past Medical History:  Diagnosis Date  . Depression   . Hepatitis C   . IV drug user   . UTI (urinary tract infection)     Patient Active Problem List   Diagnosis Date Noted  . Opioid dependence (HCC) 01/26/2012    Past Surgical History:  Procedure Laterality Date  . INDUCED ABORTION    . NO PAST SURGERIES       OB History    Gravida  0   Para      Term      Preterm      AB      Living        SAB      TAB      Ectopic      Multiple      Live Births               Home Medications    Prior to Admission medications   Medication Sig Start Date End Date Taking? Authorizing Provider  cephALEXin (KEFLEX) 500 MG capsule Take 1 capsule (500 mg total) by mouth 4 (four) times daily for 10 days. 12/22/18 01/01/19  Elpidio AnisUpstill, Shari, PA-C  doxycycline (MONODOX) 100 MG capsule Take 100 mg by mouth 2 (two) times daily. 05/14/18   [provider]  ibuprofen (ADVIL,MOTRIN) 200 MG tablet Take 600-800 mg by mouth every 8 (eight) hours as needed for headache or mild pain.    [provider]  levonorgestrel (MIRENA) 20 MCG/24HR IUD 1 each by Intrauterine route once.    [provider]  LINZESS 290 MCG CAPS capsule TAKE 1 CAPSULE(290 MCG) BY MOUTH DAILY BEFORE BREAKFAST 07/31/18    Meryl DareStark, Malcolm T, MD  naloxone [facility](NARCAN) nasal spray 4 mg/0.1 mL Spray in nose for drug overdose 05/18/18   Zadie RhineWickline, Donald, MD  nitrofurantoin, macrocrystal-monohydrate, (MACROBID) 100 MG capsule Take 1 capsule (100 mg total) by mouth 2 (two) times daily. 05/18/18   Samuel JesterMcManus, Kathleen, DO    Family History Family History  Problem Relation Age of Onset  . Cancer Paternal Grandmother        type unknown    Social History Social History   Tobacco Use  . Smoking status: Former Smoker    Types: Cigarettes    Quit date: 04/14/2011    Years since quitting: 7.6  . Smokeless tobacco: Never Used  Substance Use Topics  . Alcohol use: No  . Drug use: Yes    Frequency: 7.0 times per week    Types: Heroin, "Crack" cocaine, IV    Comment: heroin, meth     Allergies   Patient has no known allergies.   Review of Systems Review of Systems  Constitutional: Negative for  fever.  HENT: Negative for congestion.   Eyes: Negative for redness.  Respiratory: Negative for cough.   Cardiovascular: Negative for chest pain.  Gastrointestinal: Negative for abdominal pain.  Genitourinary: Negative for difficulty urinating.  Musculoskeletal: Negative for myalgias.  Skin: Negative for wound.  Neurological: Negative for headaches.     Physical Exam Updated Vital Signs BP (!) 121/98 (BP Location: Right Arm)   Pulse 99   Temp 98.1 F (36.7 C) (Oral)   Resp 14   Ht 5\' 3"  (1.6 m)   Wt 56.7 kg   SpO2 100%   BMI 22.14 kg/m   Physical Exam Vitals signs and nursing note reviewed.  Constitutional:      Appearance: She is not ill-appearing.  HENT:     Head: Normocephalic and atraumatic.  Eyes:     Conjunctiva/sclera: Conjunctivae normal.  Cardiovascular:     Rate and Rhythm: Normal rate and regular rhythm.     Pulses: Normal pulses.  Pulmonary:     Effort: Pulmonary effort is normal.     Breath sounds: Normal breath sounds. No wheezing, rhonchi or rales.  Abdominal:     General: Abdomen  is flat.     Tenderness: There is no abdominal tenderness. There is no guarding or rebound.  Musculoskeletal:     Comments: Erythema noted to left hand diffusely with minimal TTP and increased warmth; no red streaking; no lesions or drainage of any kind; cap refill < 2 seconds; 2+ radial pulse  Skin:    General: Skin is warm and dry.     Coloration: Skin is not jaundiced.  Neurological:     Mental Status: She is alert.      ED Treatments / Results  Labs (all labs ordered are listed, but only abnormal results are displayed) Labs Reviewed  NOVEL CORONAVIRUS, NAA (HOSPITAL ORDER, SEND-OUT TO REF LAB)    EKG None  Radiology No results found.  Procedures Procedures (including critical care time)  Medications Ordered in ED Medications - No data to display   Initial Impression / Assessment and Plan / ED Course  I have reviewed the triage vital signs and the nursing notes.  Pertinent labs & imaging results that were available during my care of the patient were reviewed by me and considered in my medical decision making (see chart for details).    Pt is a 28 year old female who [facility] with [facility]. Seen in the ED last night for medical clearance with IVC paperwork; [facility]. [facility] is unsure why [facility] called them to bring pt back to the hospital. [facility] staff [facility]; reports that pt was being "loud" and they sent here over here as they are unable to administer IM medications. They report they cannot take her back as they have already "discharged" her. While pt in the ED last night she was also placed on Keflex due to a possible cellulitis of her left hand where she injects.   Pt still under IVC currently; placed by friend who reports pt has been doing IV drugs and driving her car around [place] with SI. Pt denies SI currently. States her friend is concerned because she is using drugs but patient has no plans to end her life.  She reports she has responsibilities and needs to get back to them; states she has a dog that she needs to go home and take care of. She believes the [facility] was getting annoyed with  her for asking questions regarding the fact that was was involuntarily committed and did not want to be there. Pt denies being loud. Given patient still under IVC will have TTS evaluated patient; do not feel she needs repeat bloodwork given she had bloodwork less than 24 hours ago. TTS consulted. Awaiting recommendations.   TTS evaluated patient again; believes she needs to go to [facility] to finish her IVC length of stay. Will discharge patient back to [facility].         Final Clinical Impressions(s) / ED Diagnoses   Final diagnoses:  Suicidal ideation  Involuntary commitment    ED Discharge Orders    None       Eustaquio Maize, PA-C 12/22/18 2356    Charlesetta Shanks, MD 12/23/18 2127

## 2018-12-22 NOTE — Discharge Instructions (Signed)
Patient to transfer to [facility] where she has already been accepted for treatment.   She has mild redness of injection sites on left arm. No fever or elevated white cell count. No abscess. Will cover her for mild cellulitis with keflex 500 mg QID for 10 days.

## 2018-12-22 NOTE — ED Triage Notes (Signed)
Patient arrived with [employer] officers - IVC status , suicidal ideation , driving under the influence and drug abuse . She was seen here this morning and was discharged to [facility] .

## 2018-12-22 NOTE — ED Notes (Signed)
Pt wanded and all clear. 

## 2018-12-23 DIAGNOSIS — F11288 Opioid dependence with other opioid-induced disorder: Secondary | ICD-10-CM | POA: Diagnosis not present

## 2018-12-23 DIAGNOSIS — R45851 Suicidal ideations: Secondary | ICD-10-CM | POA: Diagnosis not present

## 2018-12-23 DIAGNOSIS — F1994 Other psychoactive substance use, unspecified with psychoactive substance-induced mood disorder: Secondary | ICD-10-CM | POA: Diagnosis present

## 2018-12-23 LAB — NOVEL CORONAVIRUS, NAA (HOSP ORDER, SEND-OUT TO REF LAB; TAT 18-24 HRS): SARS-CoV-2, NAA: NOT DETECTED

## 2018-12-23 MED ORDER — ACETAMINOPHEN 325 MG PO TABS: 650.0000 mg | ORAL_TABLET | ORAL | Status: DC | PRN

## 2018-12-23 MED ORDER — DICYCLOMINE HCL 20 MG PO TABS: 20.0000 mg | ORAL_TABLET | Freq: Four times a day (QID) | ORAL | Status: DC | PRN

## 2018-12-23 MED ORDER — ALUM & MAG HYDROXIDE-SIMETH 200-200-20 MG/5ML PO SUSP: 30.0000 mL | Freq: Four times a day (QID) | ORAL | Status: DC | PRN

## 2018-12-23 MED ORDER — ONDANSETRON 4 MG PO TBDP
4.0000 mg | ORAL_TABLET | Freq: Four times a day (QID) | ORAL | Status: DC | PRN
  Administered 2018-12-23: 4 mg via ORAL
  Filled 2018-12-23: qty 1

## 2018-12-23 MED ORDER — NAPROXEN 250 MG PO TABS
500.0000 mg | ORAL_TABLET | Freq: Two times a day (BID) | ORAL | Status: DC | PRN
  Administered 2018-12-23: 500 mg via ORAL
  Filled 2018-12-23: qty 2

## 2018-12-23 MED ORDER — ZOLPIDEM TARTRATE 5 MG PO TABS
5.0000 mg | ORAL_TABLET | Freq: Every evening | ORAL | Status: DC | PRN
  Administered 2018-12-23: 03:00:00 5 mg via ORAL
  Filled 2018-12-23: qty 1

## 2018-12-23 MED ORDER — LOPERAMIDE HCL 2 MG PO CAPS: 2.0000 mg | ORAL_CAPSULE | ORAL | Status: DC | PRN

## 2018-12-23 MED ORDER — HYDROXYZINE HCL 25 MG PO TABS
25.0000 mg | ORAL_TABLET | Freq: Four times a day (QID) | ORAL | Status: DC | PRN
  Administered 2018-12-23: 09:00:00 25 mg via ORAL
  Filled 2018-12-23: qty 1

## 2018-12-23 MED ORDER — METHOCARBAMOL 500 MG PO TABS: 500.0000 mg | ORAL_TABLET | Freq: Three times a day (TID) | ORAL | Status: DC | PRN

## 2018-12-23 MED ORDER — ONDANSETRON HCL 4 MG PO TABS: 4.0000 mg | ORAL_TABLET | Freq: Three times a day (TID) | ORAL | Status: DC | PRN
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
## 2018-12-23 NOTE — ED Notes (Signed)
PA aware if COWS

## 2018-12-23 NOTE — ED Provider Notes (Signed)
28 year old female history of polysubstance abuse who was seen and evaluated for reported suicidal ideation.  I reviewed records including the behavioral health consult.  She was seen by behavioral health nurse practitioner in conjunction with psychiatrist and felt to be stable for discharge.  I spoke with the patient and evaluated her.  She is oriented to person place and time.  And appears to have capacity.  She denies suicidality at this time.  Her plan is to go and stay with her father for the next few days.  I will rescind IVC.   Pattricia Boss, MD 12/23/18 1212

## 2018-12-23 NOTE — ED Provider Notes (Signed)
Pt here under IVC. Per chart review, pt was at [facility], but was becoming "loud" and refusing tx, so she was brought to the ED for further evaluation. Pt placed under IVC by her friend who states she is doing iv drugs and reports SI. Pt denying SI.  TTS last night thought to send [facility] back to [facility], but [facility] states bc pt is refusing tx, she cannot go back. TTS made aware last night, and is currently looking for placement.    Additionally, pt started on keflex for possible cellulitis of hand where injections occur.   Pt remains afebrile. On exam no significant erythema, but pt reports hand is still hurting. Pt also asking for "detox" mediations for her body aches and nausea. Will give naproxen and zofran and order prn medications.   informed by RN COW score of 9, consistent with mild withdrawal sxs.    Franchot Heidelberg, PA-C 12/23/18 [id number]    Pattricia Boss, MD 12/23/18 1343

## 2018-12-23 NOTE — ED Notes (Signed)
Diet tray ordered for pt 

## 2018-12-23 NOTE — Discharge Instructions (Addendum)
Continue Keflex until course is completed Return if you have any worsening swelling or redness at site of infection Follow-up as discussed with behavioral health

## 2018-12-23 NOTE — ED Notes (Addendum)
Per [place] at [place] pt refused treatment there and was discharged to come to [facility].  States they cannot take pt back.  Called [facility] and notified [place].  Awaiting Windell Hummingbird that did pt's TTS assessment to call charge RN back at [facility] ED.

## 2018-12-23 NOTE — ED Notes (Signed)
Belongings placed in [place] [address] [place] given to [place] has meds

## 2018-12-23 NOTE — ED Notes (Signed)
Pt offered shower states she will take one later. Setter at bedside.

## 2018-12-23 NOTE — [facility] Counselor (Signed)
  REASSESSMENT  Pt was evaluated for safety and stability.  Pt was observed sitting up in the bed, alert, pleasant and oriented x3.  Pt denies SI/HI/A/V-hallucinations.  Pt states "I'm not going to hurt myself or anyone else.  I have a strong support system.  I just used a little too much drugs.  Since COVID, I haven't been able to go to AA/NA meetings and I have been relapsing.  My friend did the IVC paperwork but I'm not going to hurt myself.  I have a lot of stuff going on in my life; I work and I go to [employer].  I'm not interested in an inpatient treatment program, I want to do an outpatient substance abuse program.  Can you help me with that so I can go home?"    [facility] Provider, Marvia Pickles, NP will assess pt to determine current disposition.  Lashay Osborne L. [place], [place], [facility], [facility] Therapeutic Triage Specialist  [phone]

## 2018-12-23 NOTE — ED Notes (Signed)
Pt taking shower in [place]. Setter remains at pt side.

## 2018-12-23 NOTE — ED Notes (Signed)
Patient completed eating breakfast, laying in bed

## 2018-12-23 NOTE — ED Notes (Signed)
TTS complete 

## 2018-12-23 NOTE — ED Notes (Signed)
CALLED [facility] FOR TRANSPORT BACK T [facility]--Kara Walker

## 2018-12-23 NOTE — ED Notes (Signed)
Called [facility] to determine plan, reports will reassess this morning to determine plan of Care.

## 2018-12-23 NOTE — ED Notes (Signed)
Pt requesting to go, informed her she is IVC and unfortunately it means she has to remain at the hospital until further notice. Pt is upset but understanding. Requesting something to sleep. Ambien PRN given.

## 2018-12-23 NOTE — ED Notes (Signed)
Pt returns from shower setter at pt side.

## 2019-02-21 DIAGNOSIS — M25519 Pain in unspecified shoulder: Secondary | ICD-10-CM | POA: Diagnosis not present

## 2019-02-21 DIAGNOSIS — M25512 Pain in left shoulder: Secondary | ICD-10-CM | POA: Diagnosis not present

## 2019-02-21 DIAGNOSIS — R202 Paresthesia of skin: Secondary | ICD-10-CM | POA: Diagnosis not present

## 2019-02-21 DIAGNOSIS — M25511 Pain in right shoulder: Secondary | ICD-10-CM | POA: Diagnosis not present

## 2019-02-21 DIAGNOSIS — R200 Anesthesia of skin: Secondary | ICD-10-CM | POA: Diagnosis not present

## 2019-02-21 DIAGNOSIS — M7989 Other specified soft tissue disorders: Secondary | ICD-10-CM | POA: Diagnosis not present

## 2019-02-21 DIAGNOSIS — M79609 Pain in unspecified limb: Secondary | ICD-10-CM | POA: Diagnosis not present

## 2019-02-21 DIAGNOSIS — I259 Chronic ischemic heart disease, unspecified: Secondary | ICD-10-CM | POA: Diagnosis not present

## 2019-02-21 DIAGNOSIS — I70219 Atherosclerosis of native arteries of extremities with intermittent claudication, unspecified extremity: Secondary | ICD-10-CM | POA: Diagnosis not present

## 2019-02-21 DIAGNOSIS — I739 Peripheral vascular disease, unspecified: Secondary | ICD-10-CM | POA: Diagnosis not present

## 2019-02-21 DIAGNOSIS — M5412 Radiculopathy, cervical region: Secondary | ICD-10-CM | POA: Diagnosis not present

## 2019-03-08 DIAGNOSIS — M9901 Segmental and somatic dysfunction of cervical region: Secondary | ICD-10-CM | POA: Diagnosis not present

## 2019-03-08 DIAGNOSIS — S335XXA Sprain of ligaments of lumbar spine, initial encounter: Secondary | ICD-10-CM | POA: Diagnosis not present

## 2019-03-27 DIAGNOSIS — R0981 Nasal congestion: Secondary | ICD-10-CM | POA: Diagnosis not present

## 2019-03-27 DIAGNOSIS — R430 Anosmia: Secondary | ICD-10-CM | POA: Diagnosis not present

## 2019-03-27 DIAGNOSIS — Z20828 Contact with and (suspected) exposure to other viral communicable diseases: Secondary | ICD-10-CM | POA: Diagnosis not present

## 2019-03-27 DIAGNOSIS — R432 Parageusia: Secondary | ICD-10-CM | POA: Diagnosis not present

## 2019-03-28 DIAGNOSIS — Z20828 Contact with and (suspected) exposure to other viral communicable diseases: Secondary | ICD-10-CM | POA: Diagnosis not present

## 2019-03-28 DIAGNOSIS — Z1159 Encounter for screening for other viral diseases: Secondary | ICD-10-CM | POA: Diagnosis not present

## 2019-03-29 DIAGNOSIS — U071 COVID-19: Secondary | ICD-10-CM | POA: Diagnosis not present

## 2019-03-29 DIAGNOSIS — Z7189 Other specified counseling: Secondary | ICD-10-CM | POA: Diagnosis not present

## 2019-04-10 DIAGNOSIS — Z79899 Other long term (current) drug therapy: Secondary | ICD-10-CM | POA: Diagnosis not present

## 2019-04-10 DIAGNOSIS — G894 Chronic pain syndrome: Secondary | ICD-10-CM | POA: Diagnosis not present

## 2019-06-26 DIAGNOSIS — N898 Other specified noninflammatory disorders of vagina: Secondary | ICD-10-CM | POA: Diagnosis not present

## 2019-07-02 ENCOUNTER — Other Ambulatory Visit: Payer: Self-pay

## 2019-07-03 ENCOUNTER — Other Ambulatory Visit: Payer: Self-pay

## 2021-09-17 ENCOUNTER — Other Ambulatory Visit: Payer: Self-pay

## 2021-09-17 ENCOUNTER — Emergency Department (HOSPITAL_COMMUNITY)
Admission: EM | Admit: 2021-09-17 | Discharge: 2021-09-17 | Disposition: A | Discharge status: Home / Self Care | Payer: BC Managed Care – PPO | Attending: Emergency Medicine | Admitting: Emergency Medicine

## 2021-09-17 ENCOUNTER — Encounter (HOSPITAL_COMMUNITY): Payer: Self-pay | Admitting: Emergency Medicine

## 2021-09-17 DIAGNOSIS — B351 Tinea unguium: Secondary | ICD-10-CM | POA: Insufficient documentation

## 2021-09-17 DIAGNOSIS — R21 Rash and other nonspecific skin eruption: Secondary | ICD-10-CM

## 2021-09-17 MED ORDER — TERBINAFINE HCL 250 MG PO TABS: 250.0000 mg | ORAL_TABLET | Freq: Every day | ORAL | 0 refills | Status: AC

## 2021-09-17 MED ORDER — LORAZEPAM 2 MG/ML IJ SOLN
INTRAMUSCULAR | Status: AC
  Filled 2021-09-17: qty 1

## 2021-09-17 MED ORDER — DOXYCYCLINE HYCLATE 100 MG PO CAPS: 100.0000 mg | ORAL_CAPSULE | Freq: Two times a day (BID) | ORAL | 0 refills | Status: AC

## 2021-09-17 MED ORDER — MUPIROCIN CALCIUM 2 % EX CREA: 1.0000 "application " | TOPICAL_CREAM | Freq: Two times a day (BID) | CUTANEOUS | 0 refills | Status: DC

## 2021-09-17 NOTE — Discharge Instructions (Signed)
Return to ED with any new symptoms such as fevers, nausea, vomiting ?Please start the medications I have sent in for you ?Please review the attached informational guidance concerning rashes ?Please follow-up with your PCP for further management ?

## 2021-09-17 NOTE — ED Triage Notes (Signed)
Patient presents with a rash on her hands and feet and states foot swelling. States she was treated for MRSA in January but didn't finish the treatment. Hx of impetigo.  ?

## 2021-09-17 NOTE — ED Provider Notes (Signed)
?[facility]-EMERGENCY DEPT ?Provider Note ? ? ?CSN: [id number] ?Arrival date & time: 09/17/21  2027 ? ?  ? ?History ? ?Chief Complaint  ?Patient presents with  ? Rash  ?  Impetigo in Jan  ? ? ?Kara BarcelonaSara Walker is a 31 y.o. female with medical history of depression, hepatitis C, IV drug use.  Patient presents to ED for evaluation of foot and hand rash.  Patient visiting town from out of town, she lives in [place]. Patient states back in December she began experiencing hand rash and foot rash.  Patient states that she also experienced area of concern on her nose.  Patient states that she presented to dermatology who advised her that she was "positive for staph" and placed her on antibiotics which she cannot name.  Patient reports that she took the antibiotics however they did not relieve her rash.  The patient states that she then presented to ED in [place] in January where she was given Keflex as well as ketoconazole cream for suspected impetigo.  Patient states that these medications did not alleviate her symptoms.  Patient returns to ED today complaining of same foot and hand rash.  Patient states that the rashes are itchy however she denies any drainage.  Patient denies nausea, vomiting, diarrhea, abdominal pain, fevers, chills. ? ? ?Rash ?Associated symptoms: no abdominal pain, no fever, no nausea and not vomiting   ? ?  ? ?Home Medications ?Prior to Admission medications   ?Medication Sig Start Date End Date Taking? Authorizing Provider  ?doxycycline (VIBRAMYCIN) 100 MG capsule Take 1 capsule (100 mg total) by mouth 2 (two) times daily for 7 days. 09/17/21 09/24/21 Yes Al DecantGroce, Joseeduardo Brix F, PA-C  ?terbinafine (LAMISIL) 250 MG tablet Take 1 tablet (250 mg total) by mouth daily. 09/17/21 12/10/21 Yes Al DecantGroce, Cashlynn Yearwood F, PA-C  ?doxycycline (MONODOX) 100 MG capsule Take 100 mg by mouth 2 (two) times daily. 05/14/18   [provider]  ?ibuprofen (ADVIL,MOTRIN) 200 MG tablet Take 600-800 mg  by mouth every 8 (eight) hours as needed for headache or mild pain.    [provider]  ?levonorgestrel (MIRENA) 20 MCG/24HR IUD 1 each by Intrauterine route once.    [provider]  ?LINZESS 290 MCG CAPS capsule TAKE 1 CAPSULE(290 MCG) BY MOUTH DAILY BEFORE BREAKFAST ?Patient taking differently: Take 290 mcg by mouth daily before breakfast.  07/31/18   Meryl DareStark, Malcolm T, MD  ?naloxone [facility](NARCAN) nasal spray 4 mg/0.1 mL Spray in nose for drug overdose 05/18/18   Zadie RhineWickline, Donald, MD  ?nitrofurantoin, macrocrystal-monohydrate, (MACROBID) 100 MG capsule Take 1 capsule (100 mg total) by mouth 2 (two) times daily. 05/18/18   Samuel JesterMcManus, Kathleen, DO  ?   ? ?Allergies    ?Patient has no known allergies.   ? ?Review of Systems   ?Review of Systems  ?Constitutional:  Negative for chills and fever.  ?Gastrointestinal:  Negative for abdominal pain, nausea and vomiting.  ?Skin:  Positive for rash.  ?All other systems reviewed and are negative. ? ?Physical Exam ?Updated Vital Signs ?BP 128/80 (BP Location: Right Arm)   Pulse 88   Temp 98.2 ?F (36.8 ?C) (Oral)   Resp 16   Ht 5\' 3"  (1.6 m)   Wt 59 kg   SpO2 100%   BMI 23.03 kg/m?  ?Physical Exam ?Vitals and nursing note reviewed.  ?Constitutional:   ?   General: She is not in acute distress. ?   Appearance: Normal appearance. She is not ill-appearing, toxic-appearing or diaphoretic.  ?  HENT:  ?   Head: Normocephalic and atraumatic.  ?   Nose: Nose normal. No congestion.  ?   Mouth/Throat:  ?   Mouth: Mucous membranes are moist.  ?   Pharynx: Oropharynx is clear.  ?Eyes:  ?   Extraocular Movements: Extraocular movements intact.  ?   Conjunctiva/sclera: Conjunctivae normal.  ?   Pupils: Pupils are equal, round, and reactive to light.  ?Cardiovascular:  ?   Rate and Rhythm: Normal rate and regular rhythm.  ?Pulmonary:  ?   Effort: Pulmonary effort is normal.  ?   Breath sounds: Normal breath sounds. No stridor. No wheezing, rhonchi or rales.  ?Abdominal:  ?    General: Abdomen is flat. Bowel sounds are normal.  ?   Palpations: Abdomen is soft.  ?   Tenderness: There is no abdominal tenderness.  ?Musculoskeletal:     ?   General: Normal range of motion.  ?   Cervical back: Normal range of motion and neck supple. No tenderness.  ?Feet:  ?   Right foot:  ?   Toenail Condition: Right toenails are abnormally thick. Fungal disease present. ?   Left foot:  ?   Toenail Condition: Left toenails are abnormally thick. Fungal disease present. ?Skin: ?   General: Skin is warm and dry.  ?   Capillary Refill: Capillary refill takes less than 2 seconds.  ?   Findings: No abscess.  ?   Comments: Several areas of erythema, scaling noted to bilateral index and ring finger.  Patient also has area of increased thickness noted to tip of nose, no erythema or surrounding redness to indicate cellulitis.  Skin appears dry, flaky.  ?Neurological:  ?   Mental Status: She is alert and oriented to person, place, and time.  ? ? ?ED Results / Procedures / Treatments   ?Labs ?(all labs ordered are listed, but only abnormal results are displayed) ?Labs Reviewed - No data to display ? ?EKG ?None ? ?Radiology ?No results found. ? ?Procedures ?Procedures  ? ? ?Medications Ordered in ED ?Medications - No data to display ? ?ED Course/ Medical Decision Making/ A&P ?  ?                        ?Medical Decision Making ?Risk ?Prescription drug management. ? ? ?Presents ED for evaluation of hand and foot rash.  Please see HPI for further details. ?On examination, patient is afebrile, nontachycardic, nonhypoxic, per lung sounds bilaterally, soft compressible abdomen.  Patient is appearance of onychomycosis noted to bilateral toenails.  Patient has applied self tanner which makes it difficult to assess skin. ? ?On visual inspection, it appears that the patient has onychomycosis noted to bilateral toenails.  She also has several areas of irritation noted to bilateral ring index fingers of her hands.  Patient be placed  on 7 days of doxycycline, 12 weeks of Terbinafine to combat suspected onychomycosis.  Patient agreeable to plan ? ?At this time, patient stable for discharge.  Patient discussed with my attending Dr. Criss Alvine who is in agreement.  Patient provided return precautions, she voiced understanding.  Patient had all her questions answered to her satisfaction.  Patient stable at this time. ? ? ?Final Clinical Impression(s) / ED Diagnoses ?Final diagnoses:  ?Rash and nonspecific skin eruption  ?Onychomycosis  ? ? ?Rx / DC Orders ?ED Discharge Orders   ? ?      Ordered  ?  doxycycline (VIBRAMYCIN) 100 MG capsule  2 times daily       ? 09/17/21 2153  ?  terbinafine (LAMISIL) 250 MG tablet  Daily       ? 09/17/21 2153  ? ?  ?  ? ?  ? ? ?  ?Al Decant, PA-C ?09/17/21 2203 ? ?  ?Pricilla Loveless, MD ?09/17/21 2230 ? ?

## 2022-01-24 ENCOUNTER — Encounter (HOSPITAL_COMMUNITY): Payer: Self-pay

## 2022-01-24 ENCOUNTER — Other Ambulatory Visit (HOSPITAL_COMMUNITY)
Admission: EM | Admit: 2022-01-24 | Discharge: 2022-01-27 | Disposition: A | Discharge status: Home / Self Care | Payer: No Payment, Other | Attending: Psychiatry | Admitting: Psychiatry

## 2022-01-24 ENCOUNTER — Other Ambulatory Visit: Payer: Self-pay

## 2022-01-24 DIAGNOSIS — F1910 Other psychoactive substance abuse, uncomplicated: Secondary | ICD-10-CM | POA: Diagnosis present

## 2022-01-24 DIAGNOSIS — F1510 Other stimulant abuse, uncomplicated: Secondary | ICD-10-CM | POA: Insufficient documentation

## 2022-01-24 DIAGNOSIS — R4589 Other symptoms and signs involving emotional state: Secondary | ICD-10-CM

## 2022-01-24 DIAGNOSIS — F69 Unspecified disorder of adult personality and behavior: Secondary | ICD-10-CM

## 2022-01-24 DIAGNOSIS — Z79899 Other long term (current) drug therapy: Secondary | ICD-10-CM | POA: Insufficient documentation

## 2022-01-24 DIAGNOSIS — R4689 Other symptoms and signs involving appearance and behavior: Secondary | ICD-10-CM | POA: Insufficient documentation

## 2022-01-24 DIAGNOSIS — Z20822 Contact with and (suspected) exposure to covid-19: Secondary | ICD-10-CM | POA: Insufficient documentation

## 2022-01-24 DIAGNOSIS — F1410 Cocaine abuse, uncomplicated: Secondary | ICD-10-CM | POA: Insufficient documentation

## 2022-01-24 LAB — POCT URINE DRUG SCREEN - MANUAL ENTRY (I-SCREEN)
POC Amphetamine UR: POSITIVE — AB
POC Buprenorphine (BUP): NOT DETECTED
POC Cocaine UR: POSITIVE — AB
POC Marijuana UR: POSITIVE — AB
POC Methadone UR: NOT DETECTED
POC Methamphetamine UR: POSITIVE — AB
POC Morphine: NOT DETECTED
POC Oxazepam (BZO): POSITIVE — AB
POC Oxycodone UR: NOT DETECTED
POC Secobarbital (BAR): NOT DETECTED

## 2022-01-24 LAB — POC SARS CORONAVIRUS 2 AG: SARSCOV2ONAVIRUS 2 AG: NEGATIVE

## 2022-01-24 LAB — POCT PREGNANCY, URINE: Preg Test, Ur: NEGATIVE

## 2022-01-24 MED ORDER — ALUM & MAG HYDROXIDE-SIMETH 200-200-20 MG/5ML PO SUSP: 30.0000 mL | ORAL | Status: DC | PRN

## 2022-01-24 MED ORDER — MAGNESIUM HYDROXIDE 400 MG/5ML PO SUSP: 30.0000 mL | Freq: Every day | ORAL | Status: DC | PRN

## 2022-01-24 MED ORDER — TRAZODONE HCL 50 MG PO TABS
50.0000 mg | ORAL_TABLET | Freq: Every evening | ORAL | Status: DC | PRN
  Administered 2022-01-24: 50 mg via ORAL
  Filled 2022-01-24: qty 1

## 2022-01-24 MED ORDER — ACETAMINOPHEN 325 MG PO TABS: 650.0000 mg | ORAL_TABLET | Freq: Four times a day (QID) | ORAL | Status: DC | PRN
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
## 2022-01-24 NOTE — Progress Notes (Signed)
Blood work not obtained d/t pt being dehydrated. Provider aware.

## 2022-01-24 NOTE — Progress Notes (Signed)
   01/24/22 1858  [facility] Triage Screening (Walk-ins at [facility] only)  What Is the Reason for Your Visit/Call Today? 31 year old female presents to [facility] requesting detox from heroin, meth, cocaine, and fentanyl. Report daily use of fentanyl/heroin and report abusing meth/cocaine 3x-4x per week. Patient report she last used noon today. Currently denies withdrawal symptoms. Denied suicidal/homicidal ideations, denied auditory/visual hallucinations. Report she wants inpatient at [facility] but was informed she had to detox 1st. Patient report both IV and snorting as a method of usage. Patient reported she started abusing drugs at 31 year-old, at 31 year-old she stopped using and was clean for 5 years. Report relapse in 2022.  How Long Has This Been Causing You Problems? <Week  Have You Recently Had Any Thoughts About Hurting Yourself? No  Are You Planning to Commit Suicide/Harm Yourself At This time? No  Have you Recently Had Thoughts About Hurting Someone Karolee Ohs? No  Are You Planning To Harm Someone At This Time? No  Are you currently experiencing any auditory, visual or other hallucinations? No  Have You Used Any Alcohol or Drugs in the Past 24 Hours? Yes  How long ago did you use Drugs or Alcohol? Report abused today 01/24/2022, last usage around noon.  What Did You Use and How Much? 1/2 gram  Do you have any current medical co-morbidities that require immediate attention? No  Clinician description of patient physical appearance/behavior: Patient dressed appropriately for the weather, she's pleasant and cooperative.  What Do You Feel Would Help You the Most Today? Alcohol or Drug Use Treatment  If access to [facility] was not available, would you have sought care in the Emergency Department? Yes  Determination of Need Urgent (48 hours)  Options For Referral Chemical Dependency Intensive Outpatient Therapy (CDIOP)

## 2022-01-25 DIAGNOSIS — R4689 Other symptoms and signs involving appearance and behavior: Secondary | ICD-10-CM | POA: Diagnosis not present

## 2022-01-25 DIAGNOSIS — F1910 Other psychoactive substance abuse, uncomplicated: Secondary | ICD-10-CM | POA: Diagnosis present

## 2022-01-25 DIAGNOSIS — F1410 Cocaine abuse, uncomplicated: Secondary | ICD-10-CM | POA: Diagnosis not present

## 2022-01-25 DIAGNOSIS — F1510 Other stimulant abuse, uncomplicated: Secondary | ICD-10-CM | POA: Diagnosis not present

## 2022-01-25 DIAGNOSIS — Z20822 Contact with and (suspected) exposure to covid-19: Secondary | ICD-10-CM | POA: Diagnosis not present

## 2022-01-25 LAB — RESP PANEL BY RT-PCR (FLU A&B, COVID) ARPGX2
Influenza A by PCR: NEGATIVE
Influenza B by PCR: NEGATIVE
SARS Coronavirus 2 by RT PCR: NEGATIVE

## 2022-01-25 MED ORDER — NAPROXEN 500 MG PO TABS
500.0000 mg | ORAL_TABLET | Freq: Two times a day (BID) | ORAL | Status: DC | PRN
  Administered 2022-01-26: 500 mg via ORAL
  Filled 2022-01-25: qty 1

## 2022-01-25 MED ORDER — METHOCARBAMOL 500 MG PO TABS
500.0000 mg | ORAL_TABLET | Freq: Three times a day (TID) | ORAL | Status: DC | PRN
  Administered 2022-01-26: 500 mg via ORAL
  Filled 2022-01-25: qty 1

## 2022-01-25 MED ORDER — LOPERAMIDE HCL 2 MG PO CAPS: 2.0000 mg | ORAL_CAPSULE | ORAL | Status: DC | PRN

## 2022-01-25 MED ORDER — ONDANSETRON 4 MG PO TBDP: 4.0000 mg | ORAL_TABLET | Freq: Four times a day (QID) | ORAL | Status: DC | PRN

## 2022-01-25 MED ORDER — HYDROXYZINE HCL 25 MG PO TABS
25.0000 mg | ORAL_TABLET | Freq: Four times a day (QID) | ORAL | Status: DC | PRN
  Administered 2022-01-26: 25 mg via ORAL
  Filled 2022-01-25: qty 1

## 2022-01-25 MED ORDER — DICYCLOMINE HCL 20 MG PO TABS
20.0000 mg | ORAL_TABLET | Freq: Four times a day (QID) | ORAL | Status: DC | PRN
  Administered 2022-01-26: 20 mg via ORAL
  Filled 2022-01-25: qty 1

## 2022-01-25 NOTE — ED Notes (Signed)
Pt asleep at this hour. No apparent distress. RR even and unlabored. Monitored for safety.  

## 2022-01-25 NOTE — Progress Notes (Addendum)
Pt here voluntarily for substance detox. Pt UDS positive for amphetamines, benzos, cocaine, methamphetamine and THC. Pt denies SI, HI, AVH and pain. Pt denies anxiety but endorses being very depressed. Pt here from [place]. Pt successfully detoxed in 2015 but relapsed at start of Covid in 2020. Recently relapsed again on polysubstances. Pt most recent use of substance was Fentanyl which she last used day of admission. Pt is tearful. Wants to detox and make it stick this time. Pt looking to go to [facility] program. Pt endorses good family support. Staying with parents now that she is back in [place]. Pt states this is her last chance and she wants to get it right. Pt denies any withdrawal symptoms. Says she has withdrawn from Fentanyl before.

## 2022-01-25 NOTE — ED Notes (Signed)
Pt is in the bed sleeping. Respirations are even and unlabored. No acute distress noted. Will continue to monitor for safety. 

## 2022-01-25 NOTE — Progress Notes (Signed)
Patient was admitted to [facility] from [facility] for detox from heroin, meth, cocaine and fentanyl. Patient was disheveled but cooperative with admission process.  She was oriented to unit and shown to her room.  Patient was given something to eat and then returned to room and bed as she reported feeling exhausted.  No evidence of withdrawal observed or reported at this time.  Patient encouraged to notify staff if symptoms begin.  She denies avh shi or plan.  Will monitor and provide support as needed.

## 2022-01-25 NOTE — Progress Notes (Signed)
Patient resting with unlabored respirations at this time. Nursing staff will continue to monitor.

## 2022-01-25 NOTE — Progress Notes (Signed)
RN attempted X 2 without success of lab specimen collection, fluids encouraged.

## 2022-01-25 NOTE — Progress Notes (Signed)
RN went to patient to collect blood specimens. Patient states, " Well I need to drink more water because my veins are not good." Patient did not move to get up out of the reclined chair. Patient continues to want to rest.

## 2022-01-25 NOTE — ED Notes (Signed)
Attempted to collect blood specimens from the L [facility], unsuccessful. Will inform oncoming shift.

## 2022-01-25 NOTE — ED Provider Notes (Addendum)
Facility Based Crisis Admission H&P  Date: 01/25/22 Patient Name: Kara Walker MRN: [id number] Chief Complaint:  Chief Complaint  Patient presents with   Addiction Problem    31 year old female presents to [facility] requesting detox from heroin, meth, cocaine, and fentanyl. Report daily use of fentanyl/heroin and report abusing meth/cocaine 3x-4x per week. Patient report she last used noon today. Currently denies withdrawal symptoms. Denied suicidal/homicidal ideations, denied auditory/visual hallucinations.      Diagnoses:  Final diagnoses:  Polysubstance abuse (HCC)  Anxious appearance  Behavior concern in adult   HPI:   Pt reassessed by nurse practitioner. Pt reports "tired" mood. States she presented to this facility as she was told by [facility] she needed to detox prior to going to their residential facility. [name] utox from 01/24/22 +amphetamine, +oxazepam, +cocaine, +methamphetamine, +marijuana. Pt endorses using these substances "couple times a week". Pt states primary substance of use is opiates/fentanyl. States she uses fentanyl daily, last use yesterday AM, 1g/occasion via "snorting". Pt endorses she does occasionally engage in IVDU of fentanyl, last time was "couple days ago". Pt denies SI/VI/HI, AVH, paranoia. She states she has never had an inpatient psychiatric admission. She denies hx of NSSI or SA. She denies access to a firearm. She endorses going to substance use rehab "couple times", last time was at [facility] in [facility]. States she was there for 2 weeks then left. Pt is interested in detox and then residential substance use treatment. Pt states she is living w/ her father, who is aware of her substance use and seeking treatment.   At present, pt denies HA, dizziness, abd pain, n/v/d, fever, chills, cp, palpitations, sob. She endorses malaise/fatigue. Pt states she has withdrawn from fentanyl previously, describes during withdrawal "it sucks, my body hurts, feel sick".  Discussed  admission to [facility]. Ordered catapres detox protocol.  Pt is a&ox3, in no acute distress, non-toxic appearing. She appears disheveled. Eye contact is minimal. Speech is clear and coherent w/ nml rate and volume. Reported mood is "tired". Affect is congruent, blunt. TP is coherent, goal directed ,linear. Description of associations is intact. TC is logical. There is no evidence of internal preoccupation, agitation, aggression or distractibility. No delusions or paranoia elicited. Pt is calm, cooperative.   PHQ 2-9:  Flowsheet Row ED from 01/24/2022 in [facility]  Thoughts that you would be better off dead, or of hurting yourself in some way Not at all  PHQ-9 Total Score 14       Flowsheet Row ED from 01/24/2022 in [facility] ED from 09/17/2021 in [facility] from 12/22/2018 in [facility] EMERGENCY DEPARTMENT  C-SSRS RISK CATEGORY No Risk No Risk No Risk       Total Time spent with patient: 20 minutes  Musculoskeletal  Strength & Muscle Tone: within normal limits Gait & Station: normal Patient leans: N/A  Psychiatric Specialty Exam  Presentation General Appearance: Disheveled  Eye Contact:Minimal  Speech:Clear and Coherent; Normal Rate  Speech Volume:Normal  Handedness:   Mood and Affect  Mood:-- ("tired")  Affect:Blunt; Congruent   Thought Process  Thought Processes:Coherent; Goal Directed; Linear  Descriptions of Associations:Intact  Orientation:Full (Time, Place and Person)  Thought Content:Logical    Hallucinations:Hallucinations: None  Ideas of Reference:None  Suicidal Thoughts:Suicidal Thoughts: No  Homicidal Thoughts:Homicidal Thoughts: No   Sensorium  Memory:Immediate Good  Judgment:Intact  Insight:Present   Executive Functions  Concentration:Fair  Attention Span:Fair  Recall:Fair  Fund of  Knowledge:Fair  Language:Fair   Psychomotor Activity  Psychomotor Activity:Psychomotor Activity: Normal   Assets  Assets:Desire for Improvement; Communication Skills; Housing; Social Support   Sleep  Sleep:Sleep: Fair   Nutritional Assessment (For OBS and FBC admissions only) Has the patient had a weight loss or gain of 10 pounds or more in the last 3 months?: No Has the patient had a decrease in food intake/or appetite?: No Does the patient have dental problems?: No Does the patient have eating habits or behaviors that may be indicators of an eating disorder including binging or inducing vomiting?: No Has the patient recently lost weight without trying?: 0 Has the patient been eating poorly because of a decreased appetite?: 0 Malnutrition Screening Tool Score: 0    Physical Exam Cardiovascular:     Rate and Rhythm: Normal rate.  Pulmonary:     Effort: Pulmonary effort is normal.  Neurological:     Mental Status: She is alert and oriented to person, place, and time.  Psychiatric:        Attention and Perception: Attention and perception normal.        Mood and Affect: Affect is blunt.        Speech: Speech normal.        Behavior: Behavior normal. Behavior is cooperative.        Thought Content: Thought content normal.        Cognition and Memory: Cognition and memory normal.        Judgment: Judgment normal.    Review of Systems  Constitutional:  Positive for malaise/fatigue. Negative for chills and fever.  Respiratory:  Negative for shortness of breath.   Cardiovascular:  Negative for chest pain and palpitations.  Gastrointestinal:  Negative for abdominal pain, diarrhea, nausea and vomiting.  Neurological:  Negative for dizziness and headaches.  Psychiatric/Behavioral:  Positive for substance abuse.     Blood pressure (!) 104/54, pulse 64, temperature 97.9 F (36.6 C), temperature source Oral, resp. rate 18, height 5' (1.524 m), weight 112 lb (50.8 kg), SpO2  99 %. Body mass index is 21.87 kg/m.  Past Psychiatric History: Polysubstance abuse  Is the patient at risk to self? No  Has the patient been a risk to self in the past 6 months? No .    Has the patient been a risk to self within the distant past? No   Is the patient a risk to others? No   Has the patient been a risk to others in the past 6 months? No   Has the patient been a risk to others within the distant past? No   Past Medical History:  Past Medical History:  Diagnosis Date   Depression    Hepatitis C    IV drug user    UTI (urinary tract infection)     Past Surgical History:  Procedure Laterality Date   INDUCED ABORTION     NO PAST SURGERIES      Family History:  Family History  Problem Relation Age of Onset   Cancer Paternal Grandmother        type unknown    Social History:  Social History   Socioeconomic History   Marital status: Single    Spouse name: Not on file   Number of children: 0   Years of education: Not on file   Highest education level: Not on file  Occupational History   Occupation: student  Tobacco Use   Smoking status: Former    Types: Cigarettes  Quit date: 04/14/2011    Years since quitting: 10.7   Smokeless tobacco: Never  Vaping Use   Vaping Use: Every day   Substances: Nicotine  Substance and Sexual Activity   Alcohol use: No   Drug use: Not Currently    Frequency: 7.0 times per week    Types: Heroin, "Crack" cocaine, IV, Fentanyl    Comment: last use Fentanyl was today   Sexual activity: Yes    Birth control/protection: None  Other Topics Concern   Not on file  Social History Narrative   Not on file   Social Determinants of Health   Financial Resource Strain: Not on file  Food Insecurity: Not on file  Transportation Needs: Not on file  Physical Activity: Not on file  Stress: Not on file  Social Connections: Not on file  Intimate Partner Violence: Not on file    SDOH:  SDOH Screenings   Alcohol Screen: Not  on file  Depression (PHQ2-9): Not on file  Financial Resource Strain: Not on file  Food Insecurity: Not on file  Housing: Not on file  Physical Activity: Not on file  Social Connections: Not on file  Stress: Not on file  Tobacco Use: Medium Risk (01/24/2022)   Patient History    Smoking Tobacco Use: Former    Smokeless Tobacco Use: Never    Passive Exposure: Not on file  Transportation Needs: Not on file    Last Labs:  Admission on 01/24/2022  Component Date Value Ref Range Status   SARS Coronavirus 2 by RT PCR 01/24/2022 NEGATIVE  NEGATIVE Final   Comment: (NOTE) SARS-CoV-2 target nucleic acids are NOT DETECTED.  The SARS-CoV-2 RNA is generally detectable in upper respiratory specimens during the acute phase of infection. The lowest concentration of SARS-CoV-2 viral copies this assay can detect is 138 copies/mL. A negative result does not preclude SARS-Cov-2 infection and should not be used as the sole basis for treatment or other patient management decisions. A negative result may occur with  improper specimen collection/handling, submission of specimen other than nasopharyngeal swab, presence of viral mutation(s) within the areas targeted by this assay, and inadequate number of viral copies(<138 copies/mL). A negative result must be combined with clinical observations, patient history, and epidemiological information. The expected result is Negative.  Fact Sheet for Patients:  BloggerCourse.com  Fact Sheet for Healthcare Providers:  SeriousBroker.it  This test is no                          t yet approved or cleared by the [place] FDA and  has been authorized for detection and/or diagnosis of SARS-CoV-2 by FDA under an Emergency Use Authorization (EUA). This EUA will remain  in effect (meaning this test can be used) for the duration of the COVID-19 declaration under Section 564(b)(1) of the Act, 21 U.S.C.section  360bbb-3(b)(1), unless the authorization is terminated  or revoked sooner.       Influenza A by PCR 01/24/2022 NEGATIVE  NEGATIVE Final   Influenza B by PCR 01/24/2022 NEGATIVE  NEGATIVE Final   Comment: (NOTE) The Xpert Xpress SARS-CoV-2/FLU/RSV plus assay is intended as an aid in the diagnosis of influenza from Nasopharyngeal swab specimens and should not be used as a sole basis for treatment. Nasal washings and aspirates are unacceptable for Xpert Xpress SARS-CoV-2/FLU/RSV testing.  Fact Sheet for Patients: BloggerCourse.com  Fact Sheet for Healthcare Providers: SeriousBroker.it  This test is not yet approved or cleared by  the [employer] and has been authorized for detection and/or diagnosis of SARS-CoV-2 by FDA under an Emergency Use Authorization (EUA). This EUA will remain in effect (meaning this test can be used) for the duration of the COVID-19 declaration under Section 564(b)(1) of the Act, 21 U.S.C. section 360bbb-3(b)(1), unless the authorization is terminated or revoked.  Performed at [facility], [address], [place], [place] [address]    POC Amphetamine UR 01/24/2022 Positive (A)  NONE DETECTED (Cut Off Level 1000 ng/mL) Final   POC Secobarbital (BAR) 01/24/2022 None Detected  NONE DETECTED (Cut Off Level 300 ng/mL) Final   POC Buprenorphine (BUP) 01/24/2022 None Detected  NONE DETECTED (Cut Off Level 10 ng/mL) Final   POC Oxazepam (BZO) 01/24/2022 Positive (A)  NONE DETECTED (Cut Off Level 300 ng/mL) Final   POC Cocaine UR 01/24/2022 Positive (A)  NONE DETECTED (Cut Off Level 300 ng/mL) Final   POC Methamphetamine UR 01/24/2022 Positive (A)  NONE DETECTED (Cut Off Level 1000 ng/mL) Final   POC Morphine 01/24/2022 None Detected  NONE DETECTED (Cut Off Level 300 ng/mL) Final   POC Methadone UR 01/24/2022 None Detected  NONE DETECTED (Cut Off Level 300 ng/mL) Final   POC Oxycodone UR 01/24/2022 None  Detected  NONE DETECTED (Cut Off Level 100 ng/mL) Final   POC Marijuana UR 01/24/2022 Positive (A)  NONE DETECTED (Cut Off Level 50 ng/mL) Final   SARSCOV2ONAVIRUS 2 AG 01/24/2022 NEGATIVE  NEGATIVE Final   Comment: (NOTE) SARS-CoV-2 antigen NOT DETECTED.   Negative results are presumptive.  Negative results do not preclude SARS-CoV-2 infection and should not be used as the sole basis for treatment or other patient management decisions, including infection  control decisions, particularly in the presence of clinical signs and  symptoms consistent with COVID-19, or in those who have been in contact with the virus.  Negative results must be combined with clinical observations, patient history, and epidemiological information. The expected result is Negative.  Fact Sheet for Patients: https://www.jennings-kim.com/  Fact Sheet for Healthcare Providers: https://alexander-rogers.biz/  This test is not yet approved or cleared by the [place] FDA and  has been authorized for detection and/or diagnosis of SARS-CoV-2 by FDA under an Emergency Use Authorization (EUA).  This EUA will remain in effect (meaning this test can be used) for the duration of  the COV                          ID-19 declaration under Section 564(b)(1) of the Act, 21 U.S.C. section 360bbb-3(b)(1), unless the authorization is terminated or revoked sooner.     Preg Test, Ur 01/24/2022 NEGATIVE  NEGATIVE Final   Comment:        THE SENSITIVITY OF THIS METHODOLOGY IS >24 mIU/mL     Allergies: Patient has no known allergies.  PTA Medications: (Not in a hospital admission)   Long Term Goals: Improvement in symptoms so as ready for discharge  Short Term Goals: Patient will verbalize feelings in meetings with treatment team members. and Pt will complete the PHQ9 on admission, day 3 and discharge.  Medical Decision Making  -Admission to [facility] -Ordered catapres detox protocol    Recommendations  Based on my evaluation the patient does not appear to have an emergency medical condition.  Lauree Chandler, NP 01/25/22  9:51 AM

## 2022-01-26 ENCOUNTER — Encounter (HOSPITAL_COMMUNITY): Payer: Self-pay

## 2022-01-26 DIAGNOSIS — R4689 Other symptoms and signs involving appearance and behavior: Secondary | ICD-10-CM | POA: Diagnosis not present

## 2022-01-26 DIAGNOSIS — Z20822 Contact with and (suspected) exposure to covid-19: Secondary | ICD-10-CM | POA: Diagnosis not present

## 2022-01-26 DIAGNOSIS — F1510 Other stimulant abuse, uncomplicated: Secondary | ICD-10-CM | POA: Diagnosis not present

## 2022-01-26 DIAGNOSIS — F1410 Cocaine abuse, uncomplicated: Secondary | ICD-10-CM | POA: Diagnosis not present

## 2022-01-26 NOTE — ED Notes (Signed)
Pt sleeping@this time. Breathing even and unlabored. Will continue to monitor for safety 

## 2022-01-26 NOTE — Clinical Social Work Psych Note (Addendum)
LCSW Initial Note  LCSW met with Rosetta for introduction and to begin discussions regarding treatment and potential discharge planning.   Pry presented with a dysphoric affect, depressed mood. Christe was pleasant and cooperative with this Probation officer. Lapenna denied having any SI, HI or AVH at this time. Doring did have complaints of withdrawal symptoms including sweating, chills and runny nose.   Nechuma shared that she presented to the [facility] to seek detox services, so that she could participate in [facility] program.   According to Altha's HPI, "Pt reassessed by nurse practitioner. Pt reports "tired" mood. States she presented to this facility as she was told by [facility] she needed to detox prior to going to their residential facility. [name] utox from 01/24/22 +amphetamine, +oxazepam, +cocaine, +methamphetamine, +marijuana. Pt endorses using these substances "couple times a week". Pt states primary substance of use is opiates/fentanyl. States she uses fentanyl daily, last use yesterday AM, 1g/occasion via "snorting". Pt endorses she does occasionally engage in IVDU of fentanyl, last time was "couple days ago". Pt denies SI/VI/HI, AVH, paranoia. She states she has never had an inpatient psychiatric admission. She denies hx of NSSI or SA. She denies access to a firearm. She endorses going to substance use rehab "couple times", last time was at [facility] in [facility]. States she was there for 2 weeks then left. Pt is interested in detox and then residential substance use treatment. Pt states she is living w/ her father, who is aware of her substance use and seeking treatment".    Ka shared that her main goal is to get into [facility]. LCSW explained the referral process to Dannisha and she was understanding.   LCSW referred Breelyn to [facility]; [place] was accepted for a screening on Monday, 01/31/2022.  Anisa denied having any additional questions at  this time.    Radonna Ricker, MSW, LCSW Clinical Education officer, museum ([place]) [facility]

## 2022-01-26 NOTE — BH IP Treatment Plan (Signed)
Interdisciplinary Treatment and Diagnostic Plan Update  01/26/2022 Time of Session: 10:45AM Kara Walker MRN: [id number]  Diagnosis:  Final diagnoses:  Polysubstance abuse (HCC)  Anxious appearance  Behavior concern in adult     Current Medications:  Current Facility-Administered Medications  Medication Dose Route Frequency Provider Last Rate Last Admin   acetaminophen (TYLENOL) tablet 650 mg  650 mg Oral Q6H PRN Sindy Guadeloupe, NP       alum & mag hydroxide-simeth (MAALOX/MYLANTA) 200-200-20 MG/5ML suspension 30 mL  30 mL Oral Q4H PRN Sindy Guadeloupe, NP       dicyclomine (BENTYL) tablet 20 mg  20 mg Oral Q6H PRN Lauree Chandler, NP       hydrOXYzine (ATARAX) tablet 25 mg  25 mg Oral Q6H PRN Lauree Chandler, NP       loperamide (IMODIUM) capsule 2-4 mg  2-4 mg Oral PRN Lauree Chandler, NP       magnesium hydroxide (MILK OF MAGNESIA) suspension 30 mL  30 mL Oral Daily PRN Sindy Guadeloupe, NP       methocarbamol (ROBAXIN) tablet 500 mg  500 mg Oral Q8H PRN Lauree Chandler, NP       naproxen (NAPROSYN) tablet 500 mg  500 mg Oral BID PRN Lauree Chandler, NP       ondansetron (ZOFRAN-ODT) disintegrating tablet 4 mg  4 mg Oral Q6H PRN Lauree Chandler, NP       traZODone (DESYREL) tablet 50 mg  50 mg Oral QHS PRN Sindy Guadeloupe, NP   50 mg at 01/24/22 2349   Current Outpatient Medications  Medication Sig Dispense Refill   levonorgestrel (MIRENA) 20 MCG/24HR IUD 1 each by Intrauterine route once.     PTA Medications: Prior to Admission medications   Medication Sig Start Date End Date Taking? Authorizing Provider  levonorgestrel (MIRENA) 20 MCG/24HR IUD 1 each by Intrauterine route once.   Yes [provider]    Patient Stressors: Substance abuse    Patient Strengths: Motivation for treatment/growth   Treatment Modalities: Medication Management, Group therapy, Case management,  1 to 1 session with clinician, Psychoeducation, Recreational  therapy.   Physician Treatment Plan for Primary and Secondary Diagnosis:  Final diagnoses:  Polysubstance abuse (HCC)  Anxious appearance  Behavior concern in adult   Long Term Goal(s): Improvement in symptoms so as ready for discharge  Short Term Goals: Patient will verbalize feelings in meetings with treatment team members. Pt will complete the PHQ9 on admission, day 3 and discharge.  Medication Management: Evaluate patient's response, side effects, and tolerance of medication regimen.  Therapeutic Interventions: 1 to 1 sessions, Unit Group sessions and Medication administration.  Evaluation of Outcomes: Progressing  LCSW Treatment Plan for Primary Diagnosis:  Final diagnoses:  Polysubstance abuse (HCC)  Anxious appearance  Behavior concern in adult    Long Term Goal(s): Safe transition to appropriate next level of care at discharge.  Short Term Goals: Facilitate acceptance of mental health diagnosis and concerns through verbal commitment to aftercare plan and appointments at discharge. and Identify minimum of 2 triggers associated with mental health/substance abuse issues with treatment team members.  Therapeutic Interventions: Assess for all discharge needs, 1 to 1 time with Child psychotherapist, Explore available resources and support systems, Assess for adequacy in community support network, Educate family and significant other(s) on suicide prevention, Complete Psychosocial Assessment, Interpersonal group therapy.  Evaluation of Outcomes: Progressing   Progress in Treatment: Attending groups: No. Participating in groups: No. Taking medication as  prescribed: Yes. Toleration medication: Yes. Family/Significant other contact made: No, will contact:  no one at this time Patient understands diagnosis: Yes. Discussing patient identified problems/goals with staff: Yes. Medical problems stabilized or resolved: Yes. Denies suicidal/homicidal ideation: Yes. Issues/concerns per  patient self-inventory: No. Other: None   New problem(s) identified: No, Describe:  None  New Short Term/Long Term Goal(s): Kara Walker reports her long-term goals is to remain sober from substance use; Kara Walker's short-term goal is to participate in [facility] program.   Patient Goals: "I need to detox because it is the only thing that works and then I want to go to [employer] or [facility]: Kara Walker reports she is interested in participating in residential substance abuse treatment services following her completion of detox services. LCSW will continue to follow for possible placement options.   Reason for Continuation of Hospitalization: Depression Medication stabilization Withdrawal symptoms  Estimated Length of Stay: 3-5 days   Last 3 [place] Suicide Severity Risk Score: Flowsheet Row ED from 01/24/2022 in [facility] ED from 09/17/2021 [facility] from 12/22/2018 in [facility] EMERGENCY DEPARTMENT  C-SSRS RISK CATEGORY No Risk No Risk No Risk       Last PHQ 2/9 Scores:    01/25/2022    8:50 AM  Depression screen PHQ 2/9  Decreased Interest 2  Down, Depressed, Hopeless 2  PHQ - 2 Score 4  Altered sleeping 2  Tired, decreased energy 2  Change in appetite 0  Feeling bad or failure about yourself  2  Trouble concentrating 2  Moving slowly or fidgety/restless 2  Suicidal thoughts 0  PHQ-9 Score 14  Difficult doing work/chores Very difficult    Scribe for Treatment Team: Maeola [name], LCSW 01/26/2022 9:37 AM

## 2022-01-26 NOTE — Progress Notes (Signed)
Received Kara Walker in her bed this AM and after talking with the medical student. She denied having pain. She denied feeling anxious and depressed. She denied feeling suicidal. She endorsed feeling tired this AM.

## 2022-01-26 NOTE — ED Notes (Signed)
Pt is in the bed sleeping. Respirations are even and unlabored. No acute distress noted. Will continue to monitor for safety. 

## 2022-01-26 NOTE — ED Provider Notes (Signed)
Behavioral Health Progress Note  Date and Time: 01/26/2022 8:38 AM Name: Kara Walker MRN:  [id number]  Subjective:    Pt reassessed by medical student. Pt reports "fine" mood. States she presented to this facility as she was told by [facility] she needed to detox prior to going to their residential facility. [name] utox from 01/24/22 +amphetamine, +oxazepam, +cocaine, +methamphetamine, +marijuana. Pt endorses using these substances "couple times a week". Pt states primary substance of use is opiates/fentanyl. States she uses fentanyl daily, last use 7/17 AM, 1g/occasion via "snorting". Pt endorses she does occasionally engage in IVDU of fentanyl, last time was "couple days ago". Pt denies SI/VI/HI, AVH, paranoia. She states she has never had an inpatient psychiatric admission. She denies hx of NSSI or SA. She denies access to a firearm. She endorses going to substance use rehab "couple times", last time was at [facility]. States she was there for 2 weeks then left. Pt is interested in detox and then residential substance use treatment. Pt states she is living w/ her father, who is aware of her substance use and seeking treatment.    At present, pt endorses "sweating and feeling hot". She denies HA, dizziness, abd pain, n/v/d, chills, cp, palpitations, sob. She endorses malaise/fatigue. Pt states she has withdrawn from fentanyl previously, describes during withdrawal "it sucks, my body hurts, feel sick".   Pt is a&ox3, in no acute distress, non-toxic appearing. She appears disheveled. Eye contact is minimal. Speech is clear and coherent w/ nml rate and volume. Reported mood is "fine". Affect is congruent, blunt. TP is coherent, goal directed ,linear. Description of associations is intact. TC is logical. There is no evidence of internal preoccupation, agitation, aggression or distractibility. No delusions or paranoia elicited. Pt is calm, cooperative.   COWS score of 0, at 7/18 0700.  Diagnosis:   Final diagnoses:  Polysubstance abuse (HCC)  Anxious appearance  Behavior concern in adult    Total Time spent with patient: 20 minutes  Past Psychiatric History: polysubstance abuse Past Medical History:  Past Medical History:  Diagnosis Date   Depression    Hepatitis C    IV drug user    UTI (urinary tract infection)     Past Surgical History:  Procedure Laterality Date   INDUCED ABORTION     NO PAST SURGERIES     Family History:  Family History  Problem Relation Age of Onset   Cancer Paternal Grandmother        type unknown   Social History:  Social History   Substance and Sexual Activity  Alcohol Use No     Social History   Substance and Sexual Activity  Drug Use Not Currently   Frequency: 7.0 times per week   Types: Heroin, "Crack" cocaine, IV, Fentanyl   Comment: last use Fentanyl was today    Social History   Socioeconomic History   Marital status: Single    Spouse name: Not on file   Number of children: 0   Years of education: Not on file   Highest education level: Not on file  Occupational History   Occupation: student  Tobacco Use   Smoking status: Former    Types: Cigarettes    Quit date: 04/14/2011    Years since quitting: 10.7   Smokeless tobacco: Never  Vaping Use   Vaping Use: Every day   Substances: Nicotine  Substance and Sexual Activity   Alcohol use: No   Drug use: Not Currently  Frequency: 7.0 times per week    Types: Heroin, "Crack" cocaine, IV, Fentanyl    Comment: last use Fentanyl was today   Sexual activity: Yes    Birth control/protection: None  Other Topics Concern   Not on file  Social History Narrative   Not on file   Social Determinants of Health   Financial Resource Strain: Not on file  Food Insecurity: Not on file  Transportation Needs: Not on file  Physical Activity: Not on file  Stress: Not on file  Social Connections: Not on file   SDOH:  SDOH Screenings   Alcohol Screen: Not on file   Depression (PHQ2-9): Medium Risk (01/25/2022)   Depression (PHQ2-9)    PHQ-2 Score: 14  Financial Resource Strain: Not on file  Food Insecurity: Not on file  Housing: Not on file  Physical Activity: Not on file  Social Connections: Not on file  Stress: Not on file  Tobacco Use: Medium Risk (01/24/2022)   Patient History    Smoking Tobacco Use: Former    Smokeless Tobacco Use: Never    Passive Exposure: Not on file  Transportation Needs: Not on file   Additional Social History:                         Sleep: Fair  Appetite:  Fair  Current Medications:  Current Facility-Administered Medications  Medication Dose Route Frequency Provider Last Rate Last Admin   acetaminophen (TYLENOL) tablet 650 mg  650 mg Oral Q6H PRN Sindy Guadeloupe, NP       alum & mag hydroxide-simeth (MAALOX/MYLANTA) 200-200-20 MG/5ML suspension 30 mL  30 mL Oral Q4H PRN Sindy Guadeloupe, NP       dicyclomine (BENTYL) tablet 20 mg  20 mg Oral Q6H PRN Lauree Chandler, NP       hydrOXYzine (ATARAX) tablet 25 mg  25 mg Oral Q6H PRN Lauree Chandler, NP       loperamide (IMODIUM) capsule 2-4 mg  2-4 mg Oral PRN Lauree Chandler, NP       magnesium hydroxide (MILK OF MAGNESIA) suspension 30 mL  30 mL Oral Daily PRN Sindy Guadeloupe, NP       methocarbamol (ROBAXIN) tablet 500 mg  500 mg Oral Q8H PRN Lauree Chandler, NP       naproxen (NAPROSYN) tablet 500 mg  500 mg Oral BID PRN Lauree Chandler, NP       ondansetron (ZOFRAN-ODT) disintegrating tablet 4 mg  4 mg Oral Q6H PRN Lauree Chandler, NP       traZODone (DESYREL) tablet 50 mg  50 mg Oral QHS PRN Sindy Guadeloupe, NP   50 mg at 01/24/22 2349   Current Outpatient Medications  Medication Sig Dispense Refill   levonorgestrel (MIRENA) 20 MCG/24HR IUD 1 each by Intrauterine route once.      Labs  Lab Results:  Admission on 01/24/2022  Component Date Value Ref Range Status   SARS Coronavirus 2 by RT PCR 01/24/2022 NEGATIVE  NEGATIVE Final    Comment: (NOTE) SARS-CoV-2 target nucleic acids are NOT DETECTED.  The SARS-CoV-2 RNA is generally detectable in upper respiratory specimens during the acute phase of infection. The lowest concentration of SARS-CoV-2 viral copies this assay can detect is 138 copies/mL. A negative result does not preclude SARS-Cov-2 infection and should not be used as the sole basis for treatment or other patient management decisions. A negative result may occur with  improper specimen collection/handling,  submission of specimen other than nasopharyngeal swab, presence of viral mutation(s) within the areas targeted by this assay, and inadequate number of viral copies(<138 copies/mL). A negative result must be combined with clinical observations, patient history, and epidemiological information. The expected result is Negative.  Fact Sheet for Patients:  BloggerCourse.com  Fact Sheet for Healthcare Providers:  SeriousBroker.it  This test is no                          t yet approved or cleared by the [place] FDA and  has been authorized for detection and/or diagnosis of SARS-CoV-2 by FDA under an Emergency Use Authorization (EUA). This EUA will remain  in effect (meaning this test can be used) for the duration of the COVID-19 declaration under Section 564(b)(1) of the Act, 21 U.S.C.section 360bbb-3(b)(1), unless the authorization is terminated  or revoked sooner.       Influenza A by PCR 01/24/2022 NEGATIVE  NEGATIVE Final   Influenza B by PCR 01/24/2022 NEGATIVE  NEGATIVE Final   Comment: (NOTE) The Xpert Xpress SARS-CoV-2/FLU/RSV plus assay is intended as an aid in the diagnosis of influenza from Nasopharyngeal swab specimens and should not be used as a sole basis for treatment. Nasal washings and aspirates are unacceptable for Xpert Xpress SARS-CoV-2/FLU/RSV testing.  Fact Sheet for  Patients: BloggerCourse.com  Fact Sheet for Healthcare Providers: SeriousBroker.it  This test is not yet approved or cleared by the [place] FDA and has been authorized for detection and/or diagnosis of SARS-CoV-2 by FDA under an Emergency Use Authorization (EUA). This EUA will remain in effect (meaning this test can be used) for the duration of the COVID-19 declaration under Section 564(b)(1) of the Act, 21 U.S.C. section 360bbb-3(b)(1), unless the authorization is terminated or revoked.  Performed at [facility], [address], [place], [place] [address]    POC Amphetamine UR 01/24/2022 Positive (A)  NONE DETECTED (Cut Off Level 1000 ng/mL) Final   POC Secobarbital (BAR) 01/24/2022 None Detected  NONE DETECTED (Cut Off Level 300 ng/mL) Final   POC Buprenorphine (BUP) 01/24/2022 None Detected  NONE DETECTED (Cut Off Level 10 ng/mL) Final   POC Oxazepam (BZO) 01/24/2022 Positive (A)  NONE DETECTED (Cut Off Level 300 ng/mL) Final   POC Cocaine UR 01/24/2022 Positive (A)  NONE DETECTED (Cut Off Level 300 ng/mL) Final   POC Methamphetamine UR 01/24/2022 Positive (A)  NONE DETECTED (Cut Off Level 1000 ng/mL) Final   POC Morphine 01/24/2022 None Detected  NONE DETECTED (Cut Off Level 300 ng/mL) Final   POC Methadone UR 01/24/2022 None Detected  NONE DETECTED (Cut Off Level 300 ng/mL) Final   POC Oxycodone UR 01/24/2022 None Detected  NONE DETECTED (Cut Off Level 100 ng/mL) Final   POC Marijuana UR 01/24/2022 Positive (A)  NONE DETECTED (Cut Off Level 50 ng/mL) Final   SARSCOV2ONAVIRUS 2 AG 01/24/2022 NEGATIVE  NEGATIVE Final   Comment: (NOTE) SARS-CoV-2 antigen NOT DETECTED.   Negative results are presumptive.  Negative results do not preclude SARS-CoV-2 infection and should not be used as the sole basis for treatment or other patient management decisions, including infection  control decisions, particularly in the presence of  clinical signs and  symptoms consistent with COVID-19, or in those who have been in contact with the virus.  Negative results must be combined with clinical observations, patient history, and epidemiological information. The expected result is Negative.  Fact Sheet for Patients: https://www.jennings-kim.com/  Fact Sheet  for Healthcare Providers: https://alexander-rogers.biz/  This test is not yet approved or cleared by the [place] and  has been authorized for detection and/or diagnosis of SARS-CoV-2 by FDA under an Emergency Use Authorization (EUA).  This EUA will remain in effect (meaning this test can be used) for the duration of  the COV                          ID-19 declaration under Section 564(b)(1) of the Act, 21 U.S.C. section 360bbb-3(b)(1), unless the authorization is terminated or revoked sooner.     Preg Test, Ur 01/24/2022 NEGATIVE  NEGATIVE Final   Comment:        THE SENSITIVITY OF THIS METHODOLOGY IS >24 mIU/mL     Blood Alcohol level:  Lab Results  Component Value Date   ETH <10 12/22/2018   ETH <11 04/11/2013    Metabolic Disorder Labs: No results found for: "HGBA1C", "MPG" No results found for: "PROLACTIN" No results found for: "CHOL", "TRIG", "HDL", "CHOLHDL", "VLDL", "LDLCALC"  Therapeutic Lab Levels: No results found for: "LITHIUM" No results found for: "VALPROATE" No results found for: "CBMZ"  Physical Findings   PHQ2-9    Flowsheet Row ED from 01/24/2022 in [facility]  PHQ-2 Total Score 4  PHQ-9 Total Score 14      Flowsheet Row ED from 01/24/2022 in [facility] ED from 09/17/2021 in [facility] from 12/22/2018 in [facility] EMERGENCY DEPARTMENT  C-SSRS RISK CATEGORY No Risk No Risk No Risk        Musculoskeletal  Strength & Muscle Tone: within normal limits Gait & Station:  normal Patient leans: N/A  Psychiatric Specialty Exam  Presentation  General Appearance: Disheveled In bed, not malodorous, adhering to good hygiene  Eye Contact:Minimal   Speech:Clear and Coherent; Normal Rate   Speech Volume:Normal   Handedness: did not assess   Mood and Affect  Mood: fine  Affect:Blunt; Congruent    Thought Process  Thought Processes:Coherent; Goal Directed; Linear   Descriptions of Associations:Intact   Orientation:Full (Time, Place and Person)   Thought Content:Logical   Diagnosis of Schizophrenia or Schizoaffective disorder in past: No data recorded    Hallucinations:Hallucinations: None   Ideas of Reference:None   Suicidal Thoughts:Suicidal Thoughts: No   Homicidal Thoughts:Homicidal Thoughts: No    Sensorium  Memory:Immediate Good   Judgment:Intact   Insight:Present    Executive Functions  Concentration:Fair   Attention Span:Fair   Recall:Fair   Fund of Knowledge:Fair   Language:Fair    Psychomotor Activity  Psychomotor Activity:Psychomotor Activity: Normal    Assets  Assets:Desire for Improvement; Manufacturing systems engineer; Housing; Social Support    Sleep  Sleep:Sleep: Fair    Nutritional Assessment (For OBS and FBC admissions only) Has the patient had a weight loss or gain of 10 pounds or more in the last 3 months?: No Has the patient had a decrease in food intake/or appetite?: No Does the patient have dental problems?: No Does the patient have eating habits or behaviors that may be indicators of an eating disorder including binging or inducing vomiting?: No Has the patient recently lost weight without trying?: 0 Has the patient been eating poorly because of a decreased appetite?: 0 Malnutrition Screening Tool Score: 0     Physical Exam  Physical Exam Cardiovascular:     Rate and Rhythm: Normal rate.  Pulmonary:     Effort:  Pulmonary effort is normal.  Neurological:     Mental  Status: She is alert and oriented to person, place, and time.  Psychiatric:        Attention and Perception: Attention and perception normal.        Mood and Affect: Affect is blunt.        Speech: Speech normal.        Behavior: Behavior normal. Behavior is cooperative.        Thought Content: Thought content normal.        Cognition and Memory: Cognition and memory normal.        Judgment: Judgment normal.    Review of Systems  Constitutional:  Positive for malaise/fatigue. Negative for chills.  Respiratory:  Negative for shortness of breath.   Cardiovascular:  Negative for chest pain and palpitations.  Gastrointestinal:  Negative for abdominal pain, diarrhea, nausea and vomiting.  Neurological:  Negative for dizziness and headaches.  Psychiatric/Behavioral:  Positive for substance abuse.    Blood pressure 117/74, pulse 74, temperature 98.2 F (36.8 C), temperature source Oral, resp. rate 18, height 5' (1.524 m), weight 50.8 kg, SpO2 100 %. Body mass index is 21.87 kg/m.  Treatment Plan Summary: Daily contact with patient to assess and evaluate symptoms and progress in treatment  ASSESSMENT  Kara Walker is a 31 year old female with history of polysubstance use disorder presenting to [facility] for polysubstance detox and residential substance treatment placement.   PLAN Polysubstance use disorder Appears to have withdrawal symptoms of fatigue, sweating, "feeling hot" -Continue to monitor -Continue Trazodone 150 mg nightly as needed for insomnia -Continue Imodium, milk of magnesia, ondansetron, Maalox, naproxen, robaxin, atarax. Bentyl, Tylenol are available as needed for patient's opioid withdrawal    [name], Medical Student 01/26/2022 8:38 AM   [name], Medical Student 01/26/22 1030

## 2022-01-26 NOTE — ED Notes (Signed)
Patient refused to attend group, she would not get up, when she did attend it was at the end of group and then she did not participate in the group.

## 2022-01-26 NOTE — Progress Notes (Signed)
Kara Walker remained OOB in the milieu at intervals and briefly to the safety plan group in the courtyard. She requested to go home x2, but was encouraged to postpone her discharge date.

## 2022-01-26 NOTE — Progress Notes (Addendum)
Kara Walker showered this AM and afterwards her cows score was 4. She requested and was given orange juice.

## 2022-01-27 DIAGNOSIS — Z20822 Contact with and (suspected) exposure to covid-19: Secondary | ICD-10-CM | POA: Diagnosis not present

## 2022-01-27 DIAGNOSIS — R4689 Other symptoms and signs involving appearance and behavior: Secondary | ICD-10-CM | POA: Diagnosis not present

## 2022-01-27 DIAGNOSIS — F1510 Other stimulant abuse, uncomplicated: Secondary | ICD-10-CM | POA: Diagnosis not present

## 2022-01-27 DIAGNOSIS — F1410 Cocaine abuse, uncomplicated: Secondary | ICD-10-CM | POA: Diagnosis not present

## 2022-01-27 NOTE — Discharge Instructions (Addendum)
Dear Kara Walker  It was a pleasure taking care of you while you were in the hospital. You were admitted for detox. Please follow up for residential substance rehab at [facility] on Monday. Refrain from any substance use. Please return to [facility] if you feel you are having worsening withdrawal or feel that you are at risk of relapsing.  Take Care!

## 2022-01-27 NOTE — ED Notes (Signed)
Pt sleeping@this time. Breathing even and unlabored. Will continue to monitor for safety 

## 2022-01-27 NOTE — ED Notes (Signed)
Patient came to nursing station and stated she wanted immediate discharge.  MD aware

## 2022-01-27 NOTE — Plan of Care (Deleted)
FBC/OBS ASAP Discharge Summary  Date and Time: 01/27/2022 2:06 PM  Name: Kara Walker  MRN:  [id number]   Discharge Diagnoses:  Final diagnoses:  Polysubstance abuse [facility])  Anxious appearance  Behavior concern in adult    Subjective:  Kara Walker is a 31 year old female with a history of polysubstance use, suicide ideation, EtOH abuse presenting to [facility] on 01/24/22 seeking detox. [name] utox from 01/24/22 +amphetamine, +oxazepam, +cocaine, +methamphetamine, +marijuana. Pt endorses using these substances "couple times a week". Pt states primary substance of use is opiates/fentanyl. States she uses fentanyl daily, last use 7/17 AM, 1g/occasion via "snorting". Pt endorses she does occasionally engage in IVDU of fentanyl, last time was "couple days ago".  Kara Walker, Ms3's assessment "Today, the patient reports worsening insomnia (described as "tossing and turning all night"), "feeling hot" that are also described as chills and sweating. Her mood is upset with congruent affect. She denies headache, dizziness, pain in the body, n/v/d, palpitations, sob. She is requesting immediate discharge because she does not like it here and she would much rather "detox at home". She does not like the people or environment here, and believes she will be more comfortable at home. She says she will detox at home and go to [facility] on Monday.  Reports that she will have social support of father at home that will help her stay clean, does not want the treatment team to contact her father. Pt denies SI/VI/HI, AVH, delusions, paranoia. She states she has never had an inpatient psychiatric admission. She denies hx of NSSI or SA. She denies access to a firearm. When trying to interview the patient, the patient said "I don't know why you're asking me all these questions because I want to go home."."  Patient seen and assessed at the bedside.  Patient requesting to discharge immediately.  Patient reports she would rather detox at  home but does not verbally state why she does not want to stay.  Patient insistent that she no longer wants to be here and that she will be able to safely detox at home and go to [facility] on Monday.  Patient reports that she has a father who is a good support system at home.  When asking if we could speak with father prior to her discharge, patient adamantly refusing and stated that she would be no one to talk to him and immediately grew more irritable.  Denies SI/HI/AVH.   Stay Summary by Problem List Kara Walker is a 31 year old female with a history of polysubstance use, suicide ideation, EtOH abuse presenting to [facility] on 01/24/22 seeking detox. [name] utox from 01/24/22 +amphetamine, +oxazepam, +cocaine, +methamphetamine, +marijuana. Pt endorses using these substances "couple times a week". Pt states primary substance of use is opiates/fentanyl. States she uses fentanyl daily, last use 7/17 AM, 1g/occasion via "snorting". Pt endorses she does occasionally engage in IVDU of fentanyl, last time was "couple days ago".  Polysubstance Abuse Patient's UDS was positive for benzodiazepine, amphetamine, cocaine, methamphetamine, marijuana.  Patient was started on CIWA and COWS protocol.  Patient had general malaise, chills, anxiety all of which appeared classic for opiate withdrawal.  Prior to completion of detox, patient requested to discharge as she did not feel she wanted to stay at [facility] any longer.  Patient was recommended to stay until her Monday [facility] appointment where she had already been accepted to.  However, patient adamantly refused and requested discharge.  Patient appeared anxious and depressed.  Patient refused all psychotropic  medications.  Patient reports that she will be able to prevent herself from relapsing although does not cite any specific coping skills or social support I would allow her to do so beyond her own motivation.  Unclear at this time whether patient was beginning to crave fentanyl use  or she was very anxious staying in a closed space while detoxing.  Patient was provided information regarding her [facility] appointment on Monday.  Patient was not started on any medications.   PCP Follow-up Recommendations: -Consider suboxone for opiate use disorder -Assess readiness to discontinue substance use   Total Time spent with patient: 45 minutes  Past Psychiatric History: depression Past Medical History:  Past Medical History:  Diagnosis Date   Depression    Hepatitis C    IV drug user    UTI (urinary tract infection)     Past Surgical History:  Procedure Laterality Date   INDUCED ABORTION     NO PAST SURGERIES     Family History:  Family History  Problem Relation Age of Onset   Cancer Paternal Grandmother        type unknown   Family Psychiatric History: none Social History:  Social History   Substance and Sexual Activity  Alcohol Use No     Social History   Substance and Sexual Activity  Drug Use Not Currently   Frequency: 7.0 times per week   Types: Heroin, "Crack" cocaine, IV, Fentanyl   Comment: last use Fentanyl was today    Social History   Socioeconomic History   Marital status: Single    Spouse name: Not on file   Number of children: 0   Years of education: Not on file   Highest education level: Not on file  Occupational History   Occupation: student  Tobacco Use   Smoking status: Former    Types: Cigarettes    Quit date: 04/14/2011    Years since quitting: 10.7   Smokeless tobacco: Never  Vaping Use   Vaping Use: Every day   Substances: Nicotine  Substance and Sexual Activity   Alcohol use: No   Drug use: Not Currently    Frequency: 7.0 times per week    Types: Heroin, "Crack" cocaine, IV, Fentanyl    Comment: last use Fentanyl was today   Sexual activity: Yes    Birth control/protection: None  Other Topics Concern   Not on file  Social History Narrative   Not on file   Social Determinants of Health   Financial Resource  Strain: Not on file  Food Insecurity: Not on file  Transportation Needs: Not on file  Physical Activity: Not on file  Stress: Not on file  Social Connections: Not on file   SDOH:  SDOH Screenings   Alcohol Screen: Not on file  Depression (PHQ2-9): Medium Risk (01/25/2022)   Depression (PHQ2-9)    PHQ-2 Score: 14  Financial Resource Strain: Not on file  Food Insecurity: Not on file  Housing: Not on file  Physical Activity: Not on file  Social Connections: Not on file  Stress: Not on file  Tobacco Use: Medium Risk (01/24/2022)   Patient History    Smoking Tobacco Use: Former    Smokeless Tobacco Use: Never    Passive Exposure: Not on file  Transportation Needs: Not on file    Tobacco Cessation:  N/A, patient does not currently use tobacco products  Current Medications:  Current Facility-Administered Medications  Medication Dose Route Frequency Provider Last Rate Last Admin  acetaminophen (TYLENOL) tablet 650 mg  650 mg Oral Q6H PRN Sindy Guadeloupe, NP       alum & mag hydroxide-simeth (MAALOX/MYLANTA) 200-200-20 MG/5ML suspension 30 mL  30 mL Oral Q4H PRN Sindy Guadeloupe, NP       dicyclomine (BENTYL) tablet 20 mg  20 mg Oral Q6H PRN Lauree Chandler, NP   20 mg at 01/26/22 1306   hydrOXYzine (ATARAX) tablet 25 mg  25 mg Oral Q6H PRN Lauree Chandler, NP   25 mg at 01/26/22 1306   loperamide (IMODIUM) capsule 2-4 mg  2-4 mg Oral PRN Lauree Chandler, NP       magnesium hydroxide (MILK OF MAGNESIA) suspension 30 mL  30 mL Oral Daily PRN Sindy Guadeloupe, NP       methocarbamol (ROBAXIN) tablet 500 mg  500 mg Oral Q8H PRN Lauree Chandler, NP   500 mg at 01/26/22 1306   naproxen (NAPROSYN) tablet 500 mg  500 mg Oral BID PRN Lauree Chandler, NP   500 mg at 01/26/22 1306   ondansetron (ZOFRAN-ODT) disintegrating tablet 4 mg  4 mg Oral Q6H PRN Lauree Chandler, NP       traZODone (DESYREL) tablet 50 mg  50 mg Oral QHS PRN Sindy Guadeloupe, NP   50 mg at 01/24/22 2349    Current Outpatient Medications  Medication Sig Dispense Refill   levonorgestrel (MIRENA) 20 MCG/24HR IUD 1 each by Intrauterine route once.      PTA Medications: (Not in a hospital admission)       01/25/2022    8:50 AM  Depression screen PHQ 2/9  Decreased Interest 2  Down, Depressed, Hopeless 2  PHQ - 2 Score 4  Altered sleeping 2  Tired, decreased energy 2  Change in appetite 0  Feeling bad or failure about yourself  2  Trouble concentrating 2  Moving slowly or fidgety/restless 2  Suicidal thoughts 0  PHQ-9 Score 14  Difficult doing work/chores Very difficult    Flowsheet Row ED from 01/24/2022 in [facility] ED from 09/17/2021 in [facility] from 12/22/2018 in [facility] EMERGENCY DEPARTMENT  C-SSRS RISK CATEGORY No Risk No Risk No Risk       Musculoskeletal  Strength & Muscle Tone: within normal limits Gait & Station: normal Patient leans: N/A  Psychiatric Specialty Exam  Presentation  General Appearance: Disheveled   Eye Contact:Minimal   Speech:Clear and Coherent; Normal Rate   Speech Volume:Normal   Handedness:Ambidextrous    Mood and Affect  Mood:-- ("tired")   Affect:Blunt; Congruent    Thought Process  Thought Processes:Coherent; Goal Directed; Linear   Descriptions of Associations:Intact   Orientation:Full (Time, Place and Person)   Thought Content:Logical      Hallucinations:No data recorded  Ideas of Reference:None   Suicidal Thoughts:No data recorded  Homicidal Thoughts:No data recorded   Sensorium  Memory:Immediate Good   Judgment:Intact   Insight:Present    Executive Functions  Concentration:Fair   Attention Span:Fair   Recall:Fair   Fund of Knowledge:Fair   Language:Fair    Psychomotor Activity  Psychomotor Activity:No data recorded   Assets  Assets:Desire for Improvement; Communication Skills;  Housing; Social Support    Sleep  Sleep:No data recorded   No data recorded   Physical Exam  Physical Exam Vitals and nursing note reviewed.  Constitutional:      General: She is not in acute distress.    Appearance: She is  well-developed.  HENT:     Head: Normocephalic and atraumatic.  Eyes:     Conjunctiva/sclera: Conjunctivae normal.  Cardiovascular:     Rate and Rhythm: Normal rate and regular rhythm.     Heart sounds: No murmur heard. Pulmonary:     Effort: Pulmonary effort is normal. No respiratory distress.     Breath sounds: Normal breath sounds.  Abdominal:     Palpations: Abdomen is soft.     Tenderness: There is no abdominal tenderness.  Musculoskeletal:        General: No swelling.     Cervical back: Neck supple.  Skin:    General: Skin is warm and dry.     Capillary Refill: Capillary refill takes less than 2 seconds.  Neurological:     Mental Status: She is alert.  Psychiatric:        Mood and Affect: Mood normal.    Review of Systems  Constitutional:  Positive for chills, diaphoresis and malaise/fatigue. Negative for fever and weight loss.  Respiratory:  Negative for shortness of breath.   Cardiovascular:  Negative for chest pain.  Gastrointestinal:  Negative for abdominal pain, constipation, diarrhea, heartburn, nausea and vomiting.  Skin:  Negative for itching and rash.  Neurological:  Negative for headaches.   Blood pressure 123/89, pulse 92, temperature 98 F (36.7 C), temperature source Oral, resp. rate 18, height 5' (1.524 m), weight 112 lb (50.8 kg), SpO2 99 %. Body mass index is 21.87 kg/m.  Demographic Factors:  Adolescent or young adult and Low socioeconomic status  Loss Factors: NA  Historical Factors: Impulsivity  Risk Reduction Factors:   NA  Continued Clinical Symptoms:  Alcohol/Substance Abuse/Dependencies  Cognitive Features That Contribute To Risk:  Closed-mindedness, Polarized thinking, and Thought constriction  (tunnel vision)    Suicide Risk:  Mild:  Suicidal ideation of limited frequency, intensity, duration, and specificity.  There are no identifiable plans, no associated intent, mild dysphoria and related symptoms, good self-control (both objective and subjective assessment), few other risk factors, and identifiable protective factors, including available and accessible social support.  Plan Of Care/Follow-up recommendations:   Follow-up recommendations:   Activity:  as tolerated Diet:  heart healthy   Comments:  Prescriptions were given at discharge.  Patient is agreeable with the discharge plan.  Patient was given an opportunity to ask questions.  Patient appears to feel comfortable with discharge and denies any current suicidal or homicidal thoughts.    Patient is instructed prior to discharge to: Take all medications as prescribed by mental healthcare provider. Report any adverse effects and or reactions from the medicines to outpatient provider promptly. In the event of worsening symptoms, patient is instructed to call the crisis hotline, 911 and or go to the nearest ED for appropriate evaluation and treatment of symptoms. Patient is to follow-up with primary care provider for other medical issues, concerns and or health care needs.   Park Pope, MD 01/27/2022, 2:06 PM

## 2022-01-27 NOTE — ED Provider Notes (Signed)
FBC/OBS ASAP Discharge Summary  Date and Time: 01/27/2022 2:06 PM  Name: Kara Walker  MRN:  [id number]   Discharge Diagnoses:  Final diagnoses:  Polysubstance abuse [facility])  Anxious appearance  Behavior concern in adult    Subjective:  Kara Walker is a 31 year old female with a history of polysubstance use, suicide ideation, EtOH abuse presenting to [facility] on 01/24/22 seeking detox. [name] utox from 01/24/22 +amphetamine, +oxazepam, +cocaine, +methamphetamine, +marijuana. Pt endorses using these substances "couple times a week". Pt states primary substance of use is opiates/fentanyl. States she uses fentanyl daily, last use 7/17 AM, 1g/occasion via "snorting". Pt endorses she does occasionally engage in IVDU of fentanyl, last time was "couple days ago".  [name], Ms3's assessment "Today, the patient reports worsening insomnia (described as "tossing and turning all night"), "feeling hot" that are also described as chills and sweating. Her mood is upset with congruent affect. She denies headache, dizziness, pain in the body, n/v/d, palpitations, sob. She is requesting immediate discharge because she does not like it here and she would much rather "detox at home". She does not like the people or environment here, and believes she will be more comfortable at home. She says she will detox at home and go to [facility] on Monday.  Reports that she will have social support of father at home that will help her stay clean, does not want the treatment team to contact her father. Pt denies SI/VI/HI, AVH, delusions, paranoia. She states she has never had an inpatient psychiatric admission. She denies hx of NSSI or SA. She denies access to a firearm. When trying to interview the patient, the patient said "I don't know why you're asking me all these questions because I want to go home."."  Patient seen and assessed at the bedside.  Patient requesting to discharge immediately.  Patient reports she would rather detox at  home but does not verbally state why she does not want to stay.  Patient insistent that she no longer wants to be here and that she will be able to safely detox at home and go to [facility] on Monday.  Patient reports that she has a father who is a good support system at home.  When asking if we could speak with father prior to her discharge, patient adamantly refusing and stated that she would be no one to talk to him and immediately grew more irritable.  Denies SI/HI/AVH.   Stay Summary by Problem List Kara Walker is a 31 year old female with a history of polysubstance use, suicide ideation, EtOH abuse presenting to [facility] on 01/24/22 seeking detox. [name] utox from 01/24/22 +amphetamine, +oxazepam, +cocaine, +methamphetamine, +marijuana. Pt endorses using these substances "couple times a week". Pt states primary substance of use is opiates/fentanyl. States she uses fentanyl daily, last use 7/17 AM, 1g/occasion via "snorting". Pt endorses she does occasionally engage in IVDU of fentanyl, last time was "couple days ago".  Polysubstance Abuse Patient's UDS was positive for benzodiazepine, amphetamine, cocaine, methamphetamine, marijuana.  Patient was started on CIWA and COWS protocol.  Patient had general malaise, chills, anxiety all of which appeared classic for opiate withdrawal.  Prior to completion of detox, patient requested to discharge as she did not feel she wanted to stay at [facility] any longer.  Patient was recommended to stay until her Monday [facility] appointment where she had already been accepted to.  However, patient adamantly refused and requested discharge.  Patient appeared anxious and depressed.  Patient refused all psychotropic  medications.  Patient reports that she will be able to prevent herself from relapsing although does not cite any specific coping skills or social support I would allow her to do so beyond her own motivation.  Unclear at this time whether patient was beginning to crave fentanyl use  or she was very anxious staying in a closed space while detoxing.  Patient was provided information regarding her [facility] appointment on Monday.  Patient was not started on any medications.   PCP Follow-up Recommendations: -Consider suboxone for opiate use disorder -Assess readiness to discontinue substance use   Total Time spent with patient: 45 minutes  Past Psychiatric History: depression Past Medical History:  Past Medical History:  Diagnosis Date   Depression    Hepatitis C    IV drug user    UTI (urinary tract infection)     Past Surgical History:  Procedure Laterality Date   INDUCED ABORTION     NO PAST SURGERIES     Family History:  Family History  Problem Relation Age of Onset   Cancer Paternal Grandmother        type unknown   Family Psychiatric History: none Social History:  Social History   Substance and Sexual Activity  Alcohol Use No     Social History   Substance and Sexual Activity  Drug Use Not Currently   Frequency: 7.0 times per week   Types: Heroin, "Crack" cocaine, IV, Fentanyl   Comment: last use Fentanyl was today    Social History   Socioeconomic History   Marital status: Single    Spouse name: Not on file   Number of children: 0   Years of education: Not on file   Highest education level: Not on file  Occupational History   Occupation: student  Tobacco Use   Smoking status: Former    Types: Cigarettes    Quit date: 04/14/2011    Years since quitting: 10.7   Smokeless tobacco: Never  Vaping Use   Vaping Use: Every day   Substances: Nicotine  Substance and Sexual Activity   Alcohol use: No   Drug use: Not Currently    Frequency: 7.0 times per week    Types: Heroin, "Crack" cocaine, IV, Fentanyl    Comment: last use Fentanyl was today   Sexual activity: Yes    Birth control/protection: None  Other Topics Concern   Not on file  Social History Narrative   Not on file   Social Determinants of Health   Financial Resource  Strain: Not on file  Food Insecurity: Not on file  Transportation Needs: Not on file  Physical Activity: Not on file  Stress: Not on file  Social Connections: Not on file   SDOH:  SDOH Screenings   Alcohol Screen: Not on file  Depression (PHQ2-9): Medium Risk (01/25/2022)   Depression (PHQ2-9)    PHQ-2 Score: 14  Financial Resource Strain: Not on file  Food Insecurity: Not on file  Housing: Not on file  Physical Activity: Not on file  Social Connections: Not on file  Stress: Not on file  Tobacco Use: Medium Risk (01/24/2022)   Patient History    Smoking Tobacco Use: Former    Smokeless Tobacco Use: Never    Passive Exposure: Not on file  Transportation Needs: Not on file    Tobacco Cessation:  N/A, patient does not currently use tobacco products  Current Medications:  Current Facility-Administered Medications  Medication Dose Route Frequency Provider Last Rate Last Admin  acetaminophen (TYLENOL) tablet 650 mg  650 mg Oral Q6H PRN Sindy Guadeloupe, NP       alum & mag hydroxide-simeth (MAALOX/MYLANTA) 200-200-20 MG/5ML suspension 30 mL  30 mL Oral Q4H PRN Sindy Guadeloupe, NP       dicyclomine (BENTYL) tablet 20 mg  20 mg Oral Q6H PRN Lauree Chandler, NP   20 mg at 01/26/22 1306   hydrOXYzine (ATARAX) tablet 25 mg  25 mg Oral Q6H PRN Lauree Chandler, NP   25 mg at 01/26/22 1306   loperamide (IMODIUM) capsule 2-4 mg  2-4 mg Oral PRN Lauree Chandler, NP       magnesium hydroxide (MILK OF MAGNESIA) suspension 30 mL  30 mL Oral Daily PRN Sindy Guadeloupe, NP       methocarbamol (ROBAXIN) tablet 500 mg  500 mg Oral Q8H PRN Lauree Chandler, NP   500 mg at 01/26/22 1306   naproxen (NAPROSYN) tablet 500 mg  500 mg Oral BID PRN Lauree Chandler, NP   500 mg at 01/26/22 1306   ondansetron (ZOFRAN-ODT) disintegrating tablet 4 mg  4 mg Oral Q6H PRN Lauree Chandler, NP       traZODone (DESYREL) tablet 50 mg  50 mg Oral QHS PRN Sindy Guadeloupe, NP   50 mg at 01/24/22 2349    Current Outpatient Medications  Medication Sig Dispense Refill   levonorgestrel (MIRENA) 20 MCG/24HR IUD 1 each by Intrauterine route once.      PTA Medications: (Not in a hospital admission)       01/25/2022    8:50 AM  Depression screen PHQ 2/9  Decreased Interest 2  Down, Depressed, Hopeless 2  PHQ - 2 Score 4  Altered sleeping 2  Tired, decreased energy 2  Change in appetite 0  Feeling bad or failure about yourself  2  Trouble concentrating 2  Moving slowly or fidgety/restless 2  Suicidal thoughts 0  PHQ-9 Score 14  Difficult doing work/chores Very difficult    Flowsheet Row ED from 01/24/2022 in [facility] ED from 09/17/2021 in [facility] from 12/22/2018 in [facility] EMERGENCY DEPARTMENT  C-SSRS RISK CATEGORY No Risk No Risk No Risk       Musculoskeletal  Strength & Muscle Tone: within normal limits Gait & Station: normal Patient leans: N/A  Psychiatric Specialty Exam  Presentation  General Appearance: Disheveled   Eye Contact:Minimal   Speech:Clear and Coherent; Normal Rate   Speech Volume:Normal   Handedness:Ambidextrous    Mood and Affect  Mood:-- ("tired")   Affect:Blunt; Congruent    Thought Process  Thought Processes:Coherent; Goal Directed; Linear   Descriptions of Associations:Intact   Orientation:Full (Time, Place and Person)   Thought Content:Logical      Hallucinations:No data recorded  Ideas of Reference:None   Suicidal Thoughts:No data recorded  Homicidal Thoughts:No data recorded   Sensorium  Memory:Immediate Good   Judgment:Intact   Insight:Present    Executive Functions  Concentration:Fair   Attention Span:Fair   Recall:Fair   Fund of Knowledge:Fair   Language:Fair    Psychomotor Activity  Psychomotor Activity:No data recorded   Assets  Assets:Desire for Improvement; Communication Skills;  Housing; Social Support    Sleep  Sleep:No data recorded   No data recorded   Physical Exam  Physical Exam Vitals and nursing note reviewed.  Constitutional:      General: She is not in acute distress.    Appearance: She is  well-developed.  HENT:     Head: Normocephalic and atraumatic.  Eyes:     Conjunctiva/sclera: Conjunctivae normal.  Cardiovascular:     Rate and Rhythm: Normal rate and regular rhythm.     Heart sounds: No murmur heard. Pulmonary:     Effort: Pulmonary effort is normal. No respiratory distress.     Breath sounds: Normal breath sounds.  Abdominal:     Palpations: Abdomen is soft.     Tenderness: There is no abdominal tenderness.  Musculoskeletal:        General: No swelling.     Cervical back: Neck supple.  Skin:    General: Skin is warm and dry.     Capillary Refill: Capillary refill takes less than 2 seconds.  Neurological:     Mental Status: She is alert.  Psychiatric:        Mood and Affect: Mood normal.    Review of Systems  Constitutional:  Positive for chills, diaphoresis and malaise/fatigue. Negative for fever and weight loss.  Respiratory:  Negative for shortness of breath.   Cardiovascular:  Negative for chest pain.  Gastrointestinal:  Negative for abdominal pain, constipation, diarrhea, heartburn, nausea and vomiting.  Skin:  Negative for itching and rash.  Neurological:  Negative for headaches.   Blood pressure 123/89, pulse 92, temperature 98 F (36.7 C), temperature source Oral, resp. rate 18, height 5' (1.524 m), weight 112 lb (50.8 kg), SpO2 99 %. Body mass index is 21.87 kg/m.  Demographic Factors:  Adolescent or young adult and Low socioeconomic status  Loss Factors: NA  Historical Factors: Impulsivity  Risk Reduction Factors:   NA  Continued Clinical Symptoms:  Alcohol/Substance Abuse/Dependencies  Cognitive Features That Contribute To Risk:  Closed-mindedness, Polarized thinking, and Thought constriction  (tunnel vision)    Suicide Risk:  Mild:  Suicidal ideation of limited frequency, intensity, duration, and specificity.  There are no identifiable plans, no associated intent, mild dysphoria and related symptoms, good self-control (both objective and subjective assessment), few other risk factors, and identifiable protective factors, including available and accessible social support.  Plan Of Care/Follow-up recommendations:   Follow-up recommendations:   Activity:  as tolerated Diet:  heart healthy   Comments:  Prescriptions were given at discharge.  Patient is agreeable with the discharge plan.  Patient was given an opportunity to ask questions.  Patient appears to feel comfortable with discharge and denies any current suicidal or homicidal thoughts.    Patient is instructed prior to discharge to: Take all medications as prescribed by mental healthcare provider. Report any adverse effects and or reactions from the medicines to outpatient provider promptly. In the event of worsening symptoms, patient is instructed to call the crisis hotline, 911 and or go to the nearest ED for appropriate evaluation and treatment of symptoms. Patient is to follow-up with primary care provider for other medical issues, concerns and or health care needs.   Park Pope, MD 01/27/2022, 2:06 PM

## 2022-04-15 ENCOUNTER — Telehealth (HOSPITAL_COMMUNITY): Payer: Self-pay | Admitting: Licensed Clinical Social Worker

## 2022-04-15 NOTE — Telephone Encounter (Signed)
04/15/2022 Name: Jenese Mischke MRN: [id number] DOB: 07-21-1990  Unsuccessful outbound call made today to assist with:   [facility] CD-IOP  Outreach Attempt:  1st Attempt  A HIPAA compliant voice message was left requesting a return call.  Instructed patient to call back at [phone] or [phone].  Dixon Boos [facility]

## 2022-04-17 ENCOUNTER — Emergency Department (HOSPITAL_BASED_OUTPATIENT_CLINIC_OR_DEPARTMENT_OTHER)
Admission: EM | Admit: 2022-04-17 | Discharge: 2022-04-17 | Discharge status: Against Medical Advice | Payer: Self-pay | Source: Home / Self Care

## 2022-04-17 ENCOUNTER — Other Ambulatory Visit: Payer: Self-pay

## 2022-07-06 ENCOUNTER — Other Ambulatory Visit: Payer: Self-pay

## 2022-07-06 ENCOUNTER — Emergency Department (HOSPITAL_COMMUNITY)
Admission: EM | Admit: 2022-07-06 | Discharge: 2022-07-06 | Discharge status: Against Medical Advice | Payer: Self-pay | Attending: Emergency Medicine | Admitting: Emergency Medicine

## 2022-07-06 ENCOUNTER — Encounter (HOSPITAL_COMMUNITY): Payer: Self-pay

## 2022-07-06 DIAGNOSIS — T7621XA Adult sexual abuse, suspected, initial encounter: Secondary | ICD-10-CM | POA: Insufficient documentation

## 2022-07-06 DIAGNOSIS — Z5321 Procedure and treatment not carried out due to patient leaving prior to being seen by health care provider: Secondary | ICD-10-CM | POA: Insufficient documentation
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
## 2022-07-06 NOTE — ED Notes (Signed)
Pt states that she is leaving.  
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
## 2022-09-04 ENCOUNTER — Emergency Department (HOSPITAL_BASED_OUTPATIENT_CLINIC_OR_DEPARTMENT_OTHER)
Admission: EM | Admit: 2022-09-04 | Discharge: 2022-09-04 | Discharge status: Against Medical Advice | Payer: Self-pay | Attending: Emergency Medicine | Admitting: Emergency Medicine

## 2022-09-04 ENCOUNTER — Other Ambulatory Visit: Payer: Self-pay

## 2022-09-04 DIAGNOSIS — Z5321 Procedure and treatment not carried out due to patient leaving prior to being seen by health care provider: Secondary | ICD-10-CM | POA: Insufficient documentation

## 2022-09-04 DIAGNOSIS — R21 Rash and other nonspecific skin eruption: Secondary | ICD-10-CM | POA: Insufficient documentation

## 2022-09-04 NOTE — ED Notes (Signed)
Pt called for triage and pt verbalized she did not want to wait and was going to go somewhere else to be seen. RN encouraged pt to stay and be seen by provider.

## 2023-09-25 ENCOUNTER — Other Ambulatory Visit (HOSPITAL_COMMUNITY)
Admission: RE | Admit: 2023-09-25 | Discharge: 2023-09-25 | Disposition: A | Discharge status: Home / Self Care | Payer: Self-pay | Source: Ambulatory Visit | Attending: Physician Assistant | Admitting: Physician Assistant

## 2023-09-25 ENCOUNTER — Ambulatory Visit: Payer: Self-pay | Admitting: Physician Assistant

## 2023-09-25 ENCOUNTER — Encounter: Payer: Self-pay | Admitting: Physician Assistant

## 2023-09-25 VITALS — BP 101/71 | HR 84 | Temp 97.6°F | Ht 63.0 in | Wt 131.0 lb

## 2023-09-25 DIAGNOSIS — N898 Other specified noninflammatory disorders of vagina: Secondary | ICD-10-CM | POA: Insufficient documentation

## 2023-09-25 DIAGNOSIS — F1410 Cocaine abuse, uncomplicated: Secondary | ICD-10-CM

## 2023-09-25 DIAGNOSIS — R768 Other specified abnormal immunological findings in serum: Secondary | ICD-10-CM

## 2023-09-25 DIAGNOSIS — F1110 Opioid abuse, uncomplicated: Secondary | ICD-10-CM

## 2023-09-25 DIAGNOSIS — B9689 Other specified bacterial agents as the cause of diseases classified elsewhere: Secondary | ICD-10-CM

## 2023-09-25 NOTE — Patient Instructions (Signed)
 VISIT SUMMARY:  Today, we discussed your recent onset of vaginal discharge and your history of Hepatitis C. You described the discharge as white, thick, and with a mild odor, accompanied by slight itching. You have already started a seven-day course of Monistat, which has shown some improvement. We also reviewed your Hepatitis C status, confirming that you have antibodies but no active infection.  YOUR PLAN:  -VAGINAL DISCHARGE: Your symptoms suggest a yeast infection, which is a common fungal infection that can cause thick, white discharge and mild itching. To confirm this, we will perform a vaginal swab to test for various infections, including gonorrhea, chlamydia, trichomonas, bacterial vaginitis, and yeast infection. If your symptoms come back, we will prescribe Diflucan, an oral antifungal medication.  -HEPATITIS C ANTIBODY POSITIVE: You have tested positive for Hepatitis C antibodies, which means you were exposed to the virus in the past. However, since no RNA was detected, it indicates that you do not have an active infection. No treatment is needed at this time.

## 2023-09-25 NOTE — Progress Notes (Unsigned)
 New Patient Office Visit  Subjective    Patient ID: Kara Walker, female    DOB: 1991-01-01  Age: 33 y.o. MRN: [id number]  CC:  Chief Complaint  Patient presents with   Vaginitis    She has S/S of a yeast infection a few days ago and the clinic started her a 7 day course of monostat and she states her sympmtoms are improving    Discussed the use of AI scribe software for clinical note transcription with the patient, who gave verbal consent to proceed.  History of Present Illness        History of Present Illness The patient, with a history of Hepatitis C, presents with a recent onset of vaginal discharge. She describes the discharge as white, thick, and unlike anything she has seen before. She also notes an unusual odor to her urine. The discharge was associated with mild itching, but not to an uncontrollable degree. She has started a seven-day course of Monistat, which has already led to some improvement in symptoms. She has had a yeast infection in the past, but it did not present with discharge.  The patient also has a history of Hepatitis C, for which she tested positive for antibodies in 2024. However, subsequent testing showed no RNA detected, and she was told that her body had cleared the infection. She has not used needles since 2015.   Outpatient Encounter Medications as of 09/25/2023  Medication Sig   levonorgestrel (MIRENA) 20 MCG/24HR IUD 1 each by Intrauterine route once.   No facility-administered encounter medications on file as of 09/25/2023.    Past Medical History:  Diagnosis Date   Depression    Hepatitis C    IV drug user    UTI (urinary tract infection)     Past Surgical History:  Procedure Laterality Date   INDUCED ABORTION     NO PAST SURGERIES      Family History  Problem Relation Age of Onset   Cancer Paternal Grandmother        type unknown    Social History   Socioeconomic History   Marital status: Single    Spouse name: Not on file    Number of children: 0   Years of education: Not on file   Highest education level: Not on file  Occupational History   Occupation: student  Tobacco Use   Smoking status: Former    Current packs/day: 0.00    Types: Cigarettes    Quit date: 04/14/2011    Years since quitting: 12.4   Smokeless tobacco: Never  Vaping Use   Vaping status: Every Day   Substances: Nicotine  Substance and Sexual Activity   Alcohol use: No   Drug use: Yes    Frequency: 7.0 times per week    Types: Heroin, "Crack" cocaine, IV, Fentanyl, Cocaine    Comment: last use Fentanyl was today   Sexual activity: Yes    Birth control/protection: None  Other Topics Concern   Not on file  Social History Narrative   Not on file   Social Drivers of Health   Financial Resource Strain: Not on file  Food Insecurity: Not on file  Transportation Needs: Not on file  Physical Activity: Not on file  Stress: Not on file  Social Connections: Not on file  Intimate Partner Violence: Not on file    Review of Systems  Constitutional:  Negative for chills and fever.  HENT: Negative.    Eyes: Negative.   Respiratory:  Negative for shortness of breath.   Cardiovascular:  Negative for chest pain.  Gastrointestinal:  Negative for abdominal pain, nausea and vomiting.  Genitourinary:  Negative for dysuria, frequency and urgency.  Musculoskeletal: Negative.   Skin: Negative.   Neurological: Negative.   Endo/Heme/Allergies: Negative.   Psychiatric/Behavioral: Negative.          Objective    BP 101/71 (BP Location: Left Arm, Patient Position: Sitting, Cuff Size: Normal)   Pulse 84   Temp 97.6 F (36.4 C)   Ht 5\' 3"  (1.6 m)   Wt 131 lb (59.4 kg)   SpO2 100%   BMI 23.21 kg/m   Physical Exam Vitals and nursing note reviewed.  Constitutional:      Appearance: Normal appearance.  HENT:     Head: Normocephalic and atraumatic.     Right Ear: External ear normal.     Left Ear: External ear normal.     Nose: Nose  normal.     Mouth/Throat:     Mouth: Mucous membranes are moist.     Pharynx: Oropharynx is clear.  Eyes:     Extraocular Movements: Extraocular movements intact.     Conjunctiva/sclera: Conjunctivae normal.     Pupils: Pupils are equal, round, and reactive to light.  Cardiovascular:     Rate and Rhythm: Normal rate and regular rhythm.     Pulses: Normal pulses.     Heart sounds: Normal heart sounds.  Pulmonary:     Effort: Pulmonary effort is normal.     Breath sounds: Normal breath sounds.  Musculoskeletal:        General: Normal range of motion.     Cervical back: Normal range of motion and neck supple.  Skin:    General: Skin is warm and dry.  Neurological:     General: No focal deficit present.     Mental Status: She is alert and oriented to person, place, and time.  Psychiatric:        Mood and Affect: Mood normal.        Behavior: Behavior normal.        Thought Content: Thought content normal.        Judgment: Judgment normal.         Assessment & Plan:   Problem List Items Addressed This Visit   None Visit Diagnoses       Heroin abuse (HCC)    -  Primary     Cocaine abuse (HCC)            Results LABS Hepatitis C Antibody: Positive (10/2022) Hepatitis C RNA: Not detected (10/2022)  Assessment and Plan Vaginal Discharge Abnormal thick white discharge with slight odor and mild pruritus, likely yeast infection. Differential includes bacterial vaginosis or STIs. Vaginal swab offered for confirmation. - Perform vaginal swab for gonorrhea, chlamydia, trichomonas, bacterial vaginitis, and yeast infection. - Prescribe Diflucan if symptoms recur.  Hepatitis C Antibody Positive Positive hepatitis C antibodies with no detectable RNA, indicating past exposure without active infection. No treatment required.  HPI Kara Walker presents to establish care ***   No follow-ups on file.   Kasandra Knudsen Mayers, PA-C

## 2023-09-26 LAB — CERVICOVAGINAL ANCILLARY ONLY
Bacterial Vaginitis (gardnerella): POSITIVE — AB
Candida Glabrata: NEGATIVE
Candida Vaginitis: NEGATIVE
Chlamydia: NEGATIVE
Comment: NEGATIVE
Comment: NEGATIVE
Comment: NEGATIVE
Comment: NEGATIVE
Comment: NEGATIVE
Comment: NORMAL
Neisseria Gonorrhea: NEGATIVE
Trichomonas: NEGATIVE

## 2023-09-27 DIAGNOSIS — F1110 Opioid abuse, uncomplicated: Secondary | ICD-10-CM | POA: Insufficient documentation

## 2023-09-27 DIAGNOSIS — R768 Other specified abnormal immunological findings in serum: Secondary | ICD-10-CM | POA: Insufficient documentation

## 2023-09-27 DIAGNOSIS — F1410 Cocaine abuse, uncomplicated: Secondary | ICD-10-CM | POA: Insufficient documentation

## 2023-09-27 MED ORDER — METRONIDAZOLE 500 MG PO TABS: 500.0000 mg | ORAL_TABLET | Freq: Two times a day (BID) | ORAL | 0 refills | Status: AC

## 2023-09-27 NOTE — Addendum Note (Signed)
 Addended by: Roney Jaffe on: 09/27/2023 02:55 AM   Modules accepted: Orders
